# Patient Record
Sex: Male | Born: 1950
Health system: Southern US, Community
[De-identification: ages and names within clinical notes are randomized; demographics above are authoritative.]

## PROBLEM LIST (undated history)

## (undated) DIAGNOSIS — N529 Male erectile dysfunction, unspecified: Secondary | ICD-10-CM

## (undated) DIAGNOSIS — J309 Allergic rhinitis, unspecified: Secondary | ICD-10-CM

## (undated) DIAGNOSIS — N400 Enlarged prostate without lower urinary tract symptoms: Secondary | ICD-10-CM

## (undated) DIAGNOSIS — E785 Hyperlipidemia, unspecified: Secondary | ICD-10-CM

## (undated) DIAGNOSIS — N433 Hydrocele, unspecified: Secondary | ICD-10-CM

## (undated) DIAGNOSIS — I10 Essential (primary) hypertension: Principal | ICD-10-CM

## (undated) DIAGNOSIS — R972 Elevated prostate specific antigen [PSA]: Secondary | ICD-10-CM

## (undated) DIAGNOSIS — N183 Chronic kidney disease, stage 3 (moderate): Secondary | ICD-10-CM

## (undated) DIAGNOSIS — M1990 Unspecified osteoarthritis, unspecified site: Secondary | ICD-10-CM

## (undated) DIAGNOSIS — R3129 Other microscopic hematuria: Secondary | ICD-10-CM

## (undated) DIAGNOSIS — K589 Irritable bowel syndrome without diarrhea: Secondary | ICD-10-CM

## (undated) DIAGNOSIS — K648 Other hemorrhoids: Secondary | ICD-10-CM

## (undated) DIAGNOSIS — H8110 Benign paroxysmal vertigo, unspecified ear: Secondary | ICD-10-CM

## (undated) HISTORY — DX: Other hemorrhoids: K64.8

## (undated) HISTORY — DX: Hyperlipidemia, unspecified: E78.5

## (undated) HISTORY — DX: Essential (primary) hypertension: I10

## (undated) HISTORY — DX: Elevated prostate specific antigen (PSA): R97.20

## (undated) HISTORY — DX: Other microscopic hematuria: R31.29

## (undated) HISTORY — DX: Male erectile dysfunction, unspecified: N52.9

## (undated) HISTORY — DX: Allergic rhinitis, unspecified: J30.9

## (undated) HISTORY — DX: Irritable bowel syndrome, unspecified: K58.9

## (undated) HISTORY — DX: Chronic kidney disease, stage 3 (moderate): N18.3

## (undated) HISTORY — DX: Hydrocele, unspecified: N43.3

## (undated) HISTORY — DX: Benign paroxysmal vertigo, unspecified ear: H81.10

## (undated) HISTORY — DX: Unspecified osteoarthritis, unspecified site: M19.90

## (undated) HISTORY — DX: Benign prostatic hyperplasia without lower urinary tract symptoms: N40.0

---

## 1997-08-02 ENCOUNTER — Encounter: Payer: Self-pay | Admitting: Family Medicine

## 1997-08-02 LAB — CONVERTED CEMR LAB: Blood Glucose, Fasting: 91 mg/dL

## 2002-03-29 ENCOUNTER — Encounter: Payer: Self-pay | Admitting: Family Medicine

## 2002-03-29 LAB — CONVERTED CEMR LAB: Blood Glucose, Fasting: 84 mg/dL

## 2002-05-24 ENCOUNTER — Encounter: Payer: Self-pay | Admitting: Family Medicine

## 2002-05-24 LAB — CONVERTED CEMR LAB
Blood Glucose, Fasting: 86 mg/dL
PSA: 0.9 ng/mL
TSH: 2.8 microintl units/mL

## 2002-06-24 ENCOUNTER — Encounter: Payer: Self-pay | Admitting: Family Medicine

## 2002-07-21 HISTORY — PX: INGUINAL HERNIA REPAIR: SUR1180

## 2003-09-05 ENCOUNTER — Encounter: Payer: Self-pay | Admitting: Family Medicine

## 2003-09-05 LAB — CONVERTED CEMR LAB
Blood Glucose, Fasting: 85 mg/dL
PSA: 1.7 ng/mL
TSH: 3.2 microintl units/mL

## 2004-05-24 ENCOUNTER — Encounter: Payer: Self-pay | Admitting: Family Medicine

## 2004-12-21 ENCOUNTER — Ambulatory Visit: Payer: Self-pay | Admitting: Family Medicine

## 2005-01-01 ENCOUNTER — Ambulatory Visit: Payer: Self-pay | Admitting: Family Medicine

## 2006-01-08 ENCOUNTER — Ambulatory Visit: Payer: Self-pay | Admitting: Family Medicine

## 2006-01-08 LAB — CONVERTED CEMR LAB
Blood Glucose, Fasting: 87 mg/dL
PSA: 1.85 ng/mL
TSH: 2.35 microintl units/mL

## 2006-01-13 ENCOUNTER — Ambulatory Visit: Payer: Self-pay | Admitting: Family Medicine

## 2008-05-24 ENCOUNTER — Ambulatory Visit: Payer: Self-pay | Admitting: Family Medicine

## 2008-05-24 LAB — CONVERTED CEMR LAB
ALT: 33 units/L (ref 0–53)
AST: 29 units/L (ref 0–37)
Albumin: 3.9 g/dL (ref 3.5–5.2)
Alkaline Phosphatase: 83 units/L (ref 39–117)
BUN: 24 mg/dL — ABNORMAL HIGH (ref 6–23)
Bilirubin, Direct: 0.1 mg/dL (ref 0.0–0.3)
CO2: 30 meq/L (ref 19–32)
Calcium: 9.1 mg/dL (ref 8.4–10.5)
Chloride: 108 meq/L (ref 96–112)
Cholesterol: 186 mg/dL (ref 0–200)
Creatinine, Ser: 1.3 mg/dL (ref 0.4–1.5)
GFR calc Af Amer: 73 mL/min
GFR calc non Af Amer: 60 mL/min
Glucose, Bld: 97 mg/dL (ref 70–99)
HDL: 40.6 mg/dL (ref >39.0)
LDL Cholesterol: 130 mg/dL — ABNORMAL HIGH (ref 0–99)
PSA: 1.69 ng/mL (ref 0.10–4.00)
Potassium: 4.2 meq/L (ref 3.5–5.1)
Sodium: 143 meq/L (ref 135–145)
TSH: 3.73 microintl units/mL (ref 0.35–5.50)
Total Bilirubin: 0.5 mg/dL (ref 0.3–1.2)
Total CHOL/HDL Ratio: 4.6
Total Protein: 7 g/dL (ref 6.0–8.3)
Triglycerides: 76 mg/dL (ref 0–149)
VLDL: 15 mg/dL (ref 0–40)

## 2008-05-30 ENCOUNTER — Ambulatory Visit: Payer: Self-pay | Admitting: Family Medicine

## 2008-06-01 ENCOUNTER — Encounter: Payer: Self-pay | Admitting: Family Medicine

## 2008-06-01 DIAGNOSIS — J309 Allergic rhinitis, unspecified: Secondary | ICD-10-CM | POA: Insufficient documentation

## 2008-06-01 DIAGNOSIS — K589 Irritable bowel syndrome without diarrhea: Secondary | ICD-10-CM

## 2008-06-01 DIAGNOSIS — E785 Hyperlipidemia, unspecified: Secondary | ICD-10-CM | POA: Insufficient documentation

## 2008-07-04 ENCOUNTER — Ambulatory Visit: Payer: Self-pay | Admitting: Gastroenterology

## 2008-07-11 ENCOUNTER — Telehealth: Payer: Self-pay | Admitting: Gastroenterology

## 2008-07-12 ENCOUNTER — Encounter: Payer: Self-pay | Admitting: Gastroenterology

## 2008-07-12 ENCOUNTER — Ambulatory Visit: Payer: Self-pay | Admitting: Gastroenterology

## 2008-07-12 HISTORY — PX: COLONOSCOPY: SHX174

## 2008-07-14 ENCOUNTER — Encounter: Payer: Self-pay | Admitting: Gastroenterology

## 2008-08-15 ENCOUNTER — Ambulatory Visit: Payer: Self-pay | Admitting: Gastroenterology

## 2008-10-07 ENCOUNTER — Ambulatory Visit: Payer: Self-pay | Admitting: Family Medicine

## 2009-02-07 ENCOUNTER — Emergency Department: Payer: Self-pay | Admitting: Emergency Medicine

## 2009-02-08 ENCOUNTER — Telehealth: Payer: Self-pay | Admitting: Internal Medicine

## 2009-02-15 ENCOUNTER — Ambulatory Visit: Payer: Self-pay | Admitting: Family Medicine

## 2009-06-07 ENCOUNTER — Ambulatory Visit: Payer: Self-pay | Admitting: Family Medicine

## 2009-06-07 LAB — CONVERTED CEMR LAB
AST: 34 units/L (ref 0–37)
Albumin: 3.8 g/dL (ref 3.5–5.2)
Basophils Absolute: 0 10*3/uL (ref 0.0–0.1)
CO2: 31 meq/L (ref 19–32)
Chloride: 106 meq/L (ref 96–112)
Direct LDL: 145.4 mg/dL
Eosinophils Absolute: 0.3 10*3/uL (ref 0.0–0.7)
Glucose, Bld: 89 mg/dL (ref 70–99)
HCT: 41.9 % (ref 39.0–52.0)
Hemoglobin: 14.3 g/dL (ref 13.0–17.0)
Lymphs Abs: 2.6 10*3/uL (ref 0.7–4.0)
MCHC: 34.1 g/dL (ref 30.0–36.0)
Neutro Abs: 3.7 10*3/uL (ref 1.4–7.7)
Potassium: 4.4 meq/L (ref 3.5–5.1)
RDW: 12.4 % (ref 11.5–14.6)
Sodium: 142 meq/L (ref 135–145)
TSH: 2.68 microintl units/mL (ref 0.35–5.50)
Total Protein: 7.3 g/dL (ref 6.0–8.3)

## 2009-06-08 ENCOUNTER — Encounter: Payer: Self-pay | Admitting: Family Medicine

## 2009-06-13 ENCOUNTER — Ambulatory Visit: Payer: Self-pay | Admitting: Family Medicine

## 2009-12-04 ENCOUNTER — Ambulatory Visit: Payer: Self-pay | Admitting: Family Medicine

## 2009-12-05 LAB — CONVERTED CEMR LAB: PSA: 2.49 ng/mL (ref 0.10–4.00)

## 2009-12-11 ENCOUNTER — Ambulatory Visit: Payer: Self-pay | Admitting: Family Medicine

## 2009-12-19 ENCOUNTER — Telehealth: Payer: Self-pay | Admitting: Family Medicine

## 2010-04-10 ENCOUNTER — Encounter: Payer: Self-pay | Admitting: Family Medicine

## 2010-04-24 ENCOUNTER — Encounter (INDEPENDENT_AMBULATORY_CARE_PROVIDER_SITE_OTHER): Payer: Self-pay | Admitting: *Deleted

## 2010-06-25 ENCOUNTER — Ambulatory Visit: Payer: Self-pay | Admitting: Family Medicine

## 2010-06-26 LAB — CONVERTED CEMR LAB
ALT: 51 units/L (ref 0–53)
AST: 48 units/L — ABNORMAL HIGH (ref 0–37)
Albumin: 3.7 g/dL (ref 3.5–5.2)
Alkaline Phosphatase: 95 units/L (ref 39–117)
Basophils Relative: 0.8 % (ref 0.0–3.0)
Eosinophils Relative: 6.5 % — ABNORMAL HIGH (ref 0.0–5.0)
GFR calc non Af Amer: 67.53 mL/min (ref >60)
Glucose, Bld: 93 mg/dL (ref 70–99)
HCT: 42.3 % (ref 39.0–52.0)
Hemoglobin: 14.3 g/dL (ref 13.0–17.0)
Lymphocytes Relative: 21.4 % (ref 12.0–46.0)
Lymphs Abs: 1.9 10*3/uL (ref 0.7–4.0)
Monocytes Relative: 10.8 % (ref 3.0–12.0)
Neutro Abs: 5.4 10*3/uL (ref 1.4–7.7)
Potassium: 4.1 meq/L (ref 3.5–5.1)
RBC: 4.49 M/uL (ref 4.22–5.81)
RDW: 13.4 % (ref 11.5–14.6)
Sodium: 141 meq/L (ref 135–145)
TSH: 2.79 microintl units/mL (ref 0.35–5.50)
Total CHOL/HDL Ratio: 4
Total Protein: 6.7 g/dL (ref 6.0–8.3)
VLDL: 38 mg/dL (ref 0.0–40.0)
WBC: 8.9 10*3/uL (ref 4.5–10.5)

## 2010-06-28 ENCOUNTER — Ambulatory Visit: Payer: Self-pay | Admitting: Family Medicine

## 2010-08-16 ENCOUNTER — Encounter: Payer: Self-pay | Admitting: Family Medicine

## 2010-10-01 ENCOUNTER — Other Ambulatory Visit: Payer: Self-pay | Admitting: Family Medicine

## 2010-10-01 ENCOUNTER — Ambulatory Visit
Admission: RE | Admit: 2010-10-01 | Discharge: 2010-10-01 | Payer: Self-pay | Source: Home / Self Care | Attending: Family Medicine | Admitting: Family Medicine

## 2010-10-01 LAB — LIPID PANEL
Cholesterol: 221 mg/dL — ABNORMAL HIGH (ref 0–200)
HDL: 48.9 mg/dL (ref >39.00)
Total CHOL/HDL Ratio: 5
Triglycerides: 94 mg/dL (ref 0.0–149.0)
VLDL: 18.8 mg/dL (ref 0.0–40.0)

## 2010-10-01 LAB — CBC WITH DIFFERENTIAL/PLATELET
Basophils Absolute: 0.1 10*3/uL (ref 0.0–0.1)
Basophils Relative: 0.7 % (ref 0.0–3.0)
Eosinophils Absolute: 0.6 10*3/uL (ref 0.0–0.7)
Eosinophils Relative: 7.5 % — ABNORMAL HIGH (ref 0.0–5.0)
HCT: 43.6 % (ref 39.0–52.0)
Hemoglobin: 14.5 g/dL (ref 13.0–17.0)
Lymphocytes Relative: 32.8 % (ref 12.0–46.0)
Lymphs Abs: 2.4 10*3/uL (ref 0.7–4.0)
MCHC: 33.2 g/dL (ref 30.0–36.0)
MCV: 93.9 fl (ref 78.0–100.0)
Monocytes Absolute: 0.8 10*3/uL (ref 0.1–1.0)
Monocytes Relative: 10.6 % (ref 3.0–12.0)
Neutro Abs: 3.6 10*3/uL (ref 1.4–7.7)
Neutrophils Relative %: 48.4 % (ref 43.0–77.0)
Platelets: 230 10*3/uL (ref 150.0–400.0)
RBC: 4.65 Mil/uL (ref 4.22–5.81)
RDW: 13.8 % (ref 11.5–14.6)
WBC: 7.4 10*3/uL (ref 4.5–10.5)

## 2010-10-01 LAB — BASIC METABOLIC PANEL
BUN: 26 mg/dL — ABNORMAL HIGH (ref 6–23)
CO2: 28 mEq/L (ref 19–32)
Calcium: 9 mg/dL (ref 8.4–10.5)
Chloride: 104 mEq/L (ref 96–112)
Creatinine, Ser: 1.2 mg/dL (ref 0.4–1.5)
GFR: 64.9 mL/min (ref >60.00)
Glucose, Bld: 98 mg/dL (ref 70–99)
Potassium: 4.1 mEq/L (ref 3.5–5.1)
Sodium: 139 mEq/L (ref 135–145)

## 2010-10-01 LAB — PSA: PSA: 2.9 ng/mL (ref 0.10–4.00)

## 2010-10-01 LAB — LDL CHOLESTEROL, DIRECT: Direct LDL: 156.9 mg/dL

## 2010-10-01 LAB — HEPATIC FUNCTION PANEL
ALT: 27 U/L (ref 0–53)
AST: 26 U/L (ref 0–37)
Albumin: 3.8 g/dL (ref 3.5–5.2)
Alkaline Phosphatase: 77 U/L (ref 39–117)
Bilirubin, Direct: 0 mg/dL (ref 0.0–0.3)
Total Bilirubin: 0.5 mg/dL (ref 0.3–1.2)
Total Protein: 6.7 g/dL (ref 6.0–8.3)

## 2010-10-01 LAB — TSH: TSH: 2.25 u[IU]/mL (ref 0.35–5.50)

## 2010-10-02 ENCOUNTER — Ambulatory Visit: Admit: 2010-10-02 | Payer: Self-pay | Admitting: Family Medicine

## 2010-10-03 ENCOUNTER — Ambulatory Visit
Admission: RE | Admit: 2010-10-03 | Discharge: 2010-10-03 | Payer: Self-pay | Source: Home / Self Care | Attending: Family Medicine | Admitting: Family Medicine

## 2010-10-16 NOTE — Assessment & Plan Note (Signed)
Summary: F/U LABWORK/CLE   Vital Signs:  Patient profile:   60 year old male Weight:      180 pounds Temp:     97.6 degrees F oral Pulse rate:   68 / minute Pulse rhythm:   regular BP sitting:   140 / 88  (left arm) Cuff size:   regular  Vitals Entered By: Sydell Axon LPN (June 28, 2010 9:10 AM) CC: follow-up visit   History of Present Illness: Pt here for followup...he had Comp Exam  05/2009 and has done well. His PSA was elevated last time. He has no complaints and is here to followup his PSA. He had had a significant amount of honey lately as he was fighting infection/URI sxs.  Problems Prior to Update: 1)  Psa, Increased By > 10%  (ICD-790.93) 2)  Special Screening For Malignant Neoplasms Colon  (ICD-V76.51) 3)  Diarrhea, Functional  (ICD-564.5) 4)  Allergic Rhinitis, Chronic  (ICD-477.9) 5)  Irritable Bowel Syndrome  (ICD-564.1) 6)  Health Maintenance Exam  (ICD-V70.0) 7)  Other and Unspecified Hyperlipidemia  (ICD-272.4) 8)  Special Screening Malignant Neoplasm of Prostate  (ICD-V76.44)  Medications Prior to Update: 1)  Multivitamins  Tabs (Multiple Vitamin) .... Once Daily 2)  Fish Oil 1000 Mg Caps (Omega-3 Fatty Acids) .... Once Daily  Allergies: No Known Drug Allergies  Physical Exam  General:  Well-developed,well-nourished,in no acute distress; alert,appropriate and cooperative throughout examination Head:  Normocephalic and atraumatic without obvious abnormalities. No apparent alopecia or balding. Sinuses NT. Eyes:  Conjunctiva clear bilaterally.  Ears:  External ear exam shows no significant lesions or deformities.  Otoscopic examination reveals clear canals, tympanic membranes are intact bilaterally without bulging, retraction, inflammation or discharge. Hearing is grossly normal bilaterally. Nose:  External nasal examination shows no deformity or inflammation. Nasal mucosa are pink and moist without lesions or exudates. Mouth:  Oral mucosa and oropharynx  without lesions or exudates.  Teeth in good repair. Neck:  No deformities, masses, or tenderness noted. Lungs:  Normal respiratory effort, chest expands symmetrically. Lungs are clear to auscultation, no crackles or wheezes. Heart:  Normal rate and regular rhythm. S1 and S2 normal without gallop, murmur, click, rub or other extra sounds.   Impression & Recommendations:  Problem # 1:  PSA, INCREASED BY > 10% (ICD-790.93) Assessment Deteriorated Continues to increase. Will recheck again in 3-4 mos and refer if over 4 and free PSA <20. Await future test. May try Ab challenge as well.  Problem # 2:  OTHER AND UNSPECIFIED HYPERLIPIDEMIA (ICD-272.4) Assessment: Improved  Great nos. except Trigs which was probably influenced by recent diet of honey.  Labs Reviewed: SGOT: 48 (06/25/2010)   SGPT: 51 (06/25/2010)   HDL:40.70 (06/25/2010), 40.90 (06/07/2009)  LDL:100 (06/25/2010), 130 (05/24/2008)  Chol:179 (06/25/2010), 226 (06/07/2009)  Trig:190.0 (06/25/2010), 179.0 (06/07/2009)  Complete Medication List: 1)  Multivitamins Tabs (Multiple vitamin) .... Once daily 2)  Fish Oil 1000 Mg Caps (Omega-3 fatty acids) .... Once daily  Patient Instructions: 1)  RTC for Comp Exam approx 3 mos with labs prior  Current Allergies (reviewed today): No known allergies

## 2010-10-16 NOTE — Miscellaneous (Signed)
Summary: Flu vaccine   Clinical Lists Changes  Observations: Added new observation of FLU VAX: Historical (08/04/2010 12:34)      Influenza Immunization History:    Influenza # 1:  Historical (08/04/2010)

## 2010-10-16 NOTE — Letter (Signed)
Summary: William Shelton letter  William Shelton at [facility]  [address] [place], [place] [address]   Phone: [phone]  Fax: [phone]       04/24/2010 MRN: [id number]  William Shelton [address] [place], [place]  [address]  Dear [name] [facility] [place] and [facility] announce the retirement of William Shelton, M.D., from full-time practice at the [place] office effective March 15, 2010 and his plans of returning part-time.  It is important to William Shelton and to our practice that you understand that [facility] has seven physicians in our office for your health care needs.  We will continue to offer the same exceptional care that you have today.    William Shelton has spoken to many of you about his plans for retirement and returning part-time in the fall.   We will continue to work with you through the transition to schedule appointments for you in the office and meet the high standards that [facility] is committed to.   Again, it is with great pleasure that we share the news that William Shelton will return to [facility] at [facility] in October of 2011 with a reduced schedule.    If you have any questions, or would like to request an appointment with one of our physicians, please call us at [phone] and press the option for Scheduling an appointment.  We take pleasure in providing you with excellent patient care and look forward to seeing you at your next office visit.  Our [facility] Physicians are:  William Shelton, M.D. William Shelton, M.D. William Shelton, M.D. William Shelton, M.D. William Shelton, M.D. William Shelton, M.D. We proudly welcomed William Shelton, M.D. and William Shelton, M.D. to the practice in July/August 2011.  Sincerely,  [place] [facility]

## 2010-10-16 NOTE — Therapy (Signed)
Summary: [facility]  [facility]   Imported By: Lanelle Bal 04/23/2010 08:32:52  _____________________________________________________________________  External Attachment:    Type:   Image     Comment:   External Document

## 2010-10-16 NOTE — Assessment & Plan Note (Signed)
Summary: 6 MONTH RECHECK/RBH   Vital Signs:  Patient profile:   60 year old male Weight:      186 pounds BMI:     24.97 Temp:     98.1 degrees F oral Pulse rate:   64 / minute Pulse rhythm:   regular BP sitting:   134 / 84  (left arm) Cuff size:   regular  Vitals Entered By: Sydell Axon LPN (December 11, 2009 2:35 PM) CC: 6 Month follow-up after labs   History of Present Illness: Pt here for 6 month followup. His PSA had increased by more than 10% last time. He feels well and has no complaints.  Problems Prior to Update: 1)  Psa, Increased By > 10%  (ICD-790.93) 2)  Special Screening For Malignant Neoplasms Colon  (ICD-V76.51) 3)  Diarrhea, Functional  (ICD-564.5) 4)  Allergic Rhinitis, Chronic  (ICD-477.9) 5)  Irritable Bowel Syndrome  (ICD-564.1) 6)  Health Maintenance Exam  (ICD-V70.0) 7)  Other and Unspecified Hyperlipidemia  (ICD-272.4) 8)  Special Screening Malignant Neoplasm of Prostate  (ICD-V76.44)  Medications Prior to Update: 1)  Multivitamins  Tabs (Multiple Vitamin) .... Once Daily 2)  Fish Oil 1000 Mg Caps (Omega-3 Fatty Acids) .... Once Daily  Allergies: No Known Drug Allergies  Physical Exam  General:  Well-developed,well-nourished,in no acute distress; alert,appropriate and cooperative throughout examination Head:  Normocephalic and atraumatic without obvious abnormalities. No apparent alopecia or balding. Sinuses NT. Eyes:  Conjunctiva clear bilaterally.  Ears:  External ear exam shows no significant lesions or deformities.  Otoscopic examination reveals clear canals, tympanic membranes are intact bilaterally without bulging, retraction, inflammation or discharge. Hearing is grossly normal bilaterally. Nose:  External nasal examination shows no deformity or inflammation. Nasal mucosa are pink and moist without lesions or exudates. Mouth:  Oral mucosa and oropharynx without lesions or exudates.  Teeth in good repair. Neck:  No deformities, masses, or  tenderness noted. Chest Wall:  No deformities, masses, tenderness or gynecomastia noted. Lungs:  Normal respiratory effort, chest expands symmetrically. Lungs are clear to auscultation, no crackles or wheezes. Heart:  Normal rate and regular rhythm. S1 and S2 normal without gallop, murmur, click, rub or other extra sounds.   Impression & Recommendations:  Problem # 1:  PSA, INCREASED BY > 10% (ICD-790.93) Has decreased from 2.55 to 2.49. Will cont to follow.   Complete Medication List: 1)  Multivitamins Tabs (Multiple vitamin) .... Once daily 2)  Fish Oil 1000 Mg Caps (Omega-3 fatty acids) .... Once daily  Patient Instructions: 1)  RTC as discussed. 2)  If can't get appt in Oct for Comp Exam, have appt for PSA recheck.  Current Allergies (reviewed today): No known allergies

## 2010-10-16 NOTE — Progress Notes (Signed)
  Phone Note Call from Patient   Summary of Call: Pt's wife here for appt. Requested Epi pen for hwer husband who works with [employer] and has students come over. Script handwritten and given to pt for Epi pen Kit. Initial call taken by: Shaune Leeks MD,  December 19, 2009 2:35 PM

## 2010-10-18 NOTE — Assessment & Plan Note (Signed)
Summary: CPX/RBH   Vital Signs:  Patient profile:   60 year old male Height:      72 inches Weight:      175.75 pounds BMI:     23.92 Temp:     98.6 degrees F oral Pulse rate:   68 / minute Pulse rhythm:   regular BP sitting:   118 / 78  (left arm) Cuff size:   regular  Vitals Entered By: Sydell Axon LPN (October 03, 2010 9:29 AM) CC: 30 Minute checkup, had a colonoscopy 10/09 by Dr. [name]   History of Present Illness: Pt here for Comp Exam..Marland KitchenHe is walking regularly.  He takes Excedrin for headaches twice a month at most....fdiscussed risk/benefit.   Preventive Screening-Counseling & Management  Alcohol-Tobacco     Alcohol drinks/day: 0     Alcohol type: Rare wine     Smoking Status: never     Passive Smoke Exposure: no  Caffeine-Diet-Exercise     Caffeine use/day: 2     Does Patient Exercise: yes     Type of exercise: treadmill 21/2 miles weight machines, yoga     Exercise (avg: min/session): 30-60  Problems Prior to Update: 1)  Psa, Increased By > 10%  (ICD-790.93) 2)  Special Screening For Malignant Neoplasms Colon  (ICD-V76.51) 3)  Diarrhea, Functional  (ICD-564.5) 4)  Allergic Rhinitis, Chronic  (ICD-477.9) 5)  Irritable Bowel Syndrome  (ICD-564.1) 6)  Health Maintenance Exam  (ICD-V70.0) 7)  Other and Unspecified Hyperlipidemia  (ICD-272.4) 8)  Special Screening Malignant Neoplasm of Prostate  (ICD-V76.44)  Medications Prior to Update: 1)  Multivitamins  Tabs (Multiple Vitamin) .... Once Daily 2)  Fish Oil 1000 Mg Caps (Omega-3 Fatty Acids) .... Once Daily  Current Medications (verified): 1)  Multivitamins  Tabs (Multiple Vitamin) .... Once Daily 2)  Fish Oil 1000 Mg Caps (Omega-3 Fatty Acids) .... Once Daily  Allergies: No Known Drug Allergies  Past History:  Past Medical History: Last updated: 07/04/2008 Hyperlipidemia Irritable Bowel Syndrome  Past Surgical History: Last updated: 08/15/2008 L Ingunal Herniorraphy (Dr Lemar Livings)  07/21/02 Colonoscopy Int Hems (Dr [name]) 07/12/08       10 years  Family History: Last updated: 10/03/2010 Father: A  85  Htn Prostate cancer (radiation beads) Macular                  Degen Mother: dec UNKNOWN CAUSES BROTHER A  65  CHOLESTEROL BPH 1/2 SISTER  A  46 CV: NEGATIVE HBP: + FATHER DM: NEGATIVE GOUT/ARTHRITIS:  PROSTATE CANCER: + FATHER BREAST/OVARIAN/UTERINE CANCER: NEGATIVE COLON CANCER: NEGATIVE DEPRESSION: NEGATIVE ETOH/DRUG ABUSE: NEGATIVE OTHER: NEGATIVE STROKE  Social History: Last updated: 08/15/2008 Marital Status: Married  LIVES WITH WIFE  Children: 1 SON  Occupation: RETIRED ENGINEER [employer]                     BEEKEEPING Patient has never smoked.  Alcohol Use - no Daily Caffeine Use  Risk Factors: Alcohol Use: 0 (10/03/2010) Caffeine Use: 2 (10/03/2010) Exercise: yes (10/03/2010)  Risk Factors: Smoking Status: never (10/03/2010) Passive Smoke Exposure: no (10/03/2010)  Family History: Father: A  44  Htn Prostate cancer (radiation beads) Macular                  Degen Mother: dec UNKNOWN CAUSES BROTHER A  65  CHOLESTEROL BPH 1/2 SISTER  A  46 CV: NEGATIVE HBP: + FATHER DM: NEGATIVE GOUT/ARTHRITIS:  PROSTATE CANCER: + FATHER BREAST/OVARIAN/UTERINE CANCER: NEGATIVE COLON CANCER: NEGATIVE DEPRESSION:  NEGATIVE ETOH/DRUG ABUSE: NEGATIVE OTHER: NEGATIVE STROKE  Review of Systems General:  Denies chills, fatigue, fever, sweats, weakness, and weight loss. Eyes:  Denies blurring, discharge, and eye pain. ENT:  Complains of decreased hearing; denies ear discharge, earache, and ringing in ears; has hearing aids. CV:  Denies chest pain or discomfort, fainting, fatigue, palpitations, shortness of breath with exertion, swelling of feet, and swelling of hands. Resp:  Denies cough, shortness of breath, and wheezing. GI:  Denies abdominal pain, bloody stools, change in bowel habits, constipation, dark tarry stools, diarrhea, indigestion, loss of  appetite, nausea, vomiting, vomiting blood, and yellowish skin color. GU:  Complains of urinary frequency; denies discharge, dysuria, and nocturia. MS:  Denies joint pain, low back pain, muscle aches, cramps, and stiffness. Derm:  Complains of dryness; denies itching and rash; winter. Neuro:  Denies numbness, poor balance, tingling, and tremors.  Physical Exam  General:  Well-developed,well-nourished,in no acute distress; alert,appropriate and cooperative throughout examination. Looks very healthy. Head:  Normocephalic and atraumatic without obvious abnormalities. Sinuses NT. Eyes:  Conjunctiva clear bilaterally.  Ears:  External ear exam shows no significant lesions or deformities.  Otoscopic examination reveals clear canals, tympanic membranes are intact bilaterally without bulging, retraction, inflammation or discharge. Hearing is grossly normal bilaterally. Nose:  External nasal examination shows no deformity or inflammation. Nasal mucosa are pink and moist without lesions or exudates. Mouth:  Oral mucosa and oropharynx without lesions or exudates.  Teeth in good repair. Neck:  No deformities, masses, or tenderness noted. Chest Wall:  No deformities, masses, tenderness or gynecomastia noted. Breasts:  No masses or gynecomastia noted Lungs:  Normal respiratory effort, chest expands symmetrically. Lungs are clear to auscultation, no crackles or wheezes. Heart:  Normal rate and regular rhythm. S1 and S2 normal without gallop, murmur, click, rub or other extra sounds. Abdomen:  Bowel sounds positive,abdomen soft and non-tender without masses, organomegaly or hernias noted. Rectal:  No external abnormalities noted. Normal sphincter tone. No rectal masses or tenderness. G neg. Genitalia:  Testes bilaterally descended without nodularity, tenderness or masses. No scrotal masses or lesions. No penis lesions or urethral discharge. Prostate:  Prostate gland firm and smooth, no enlargement, nodularity,  tenderness, mass, asymmetry or induration. 20gms. Msk:  No deformity or scoliosis noted of thoracic or lumbar spine.   Pulses:  R and L carotid,radial,femoral,dorsalis pedis and posterior tibial pulses are full and equal bilaterally Extremities:  No clubbing, cyanosis, edema, or deformity noted with normal full range of motion of all joints.   Neurologic:  No cranial nerve deficits noted. Station and gait are normal. Plantar reflexes are down-going bilaterally. DTRs are symmetrical throughout. Sensory, motor and coordinative functions appear intact. Skin:  Intact without suspicious lesions or rashes, torso SKs. Cervical Nodes:  No lymphadenopathy noted Inguinal Nodes:  No significant adenopathy Psych:  Cognition and judgment appear intact. Alert and cooperative with normal attention span and concentration. No apparent delusions, illusions, hallucinations   Impression & Recommendations:  Problem # 1:  HEALTH MAINTENANCE EXAM (ICD-V70.0) Assessment Comment Only  Reviewed preventive care protocols, scheduled due services, and updated immunizations.  Problem # 2:  PSA, INCREASED BY > 10% (ICD-790.93) Assessment: Unchanged Stable, no further increase. Will continue to follow.  Problem # 3:  ALLERGIC RHINITIS, CHRONIC (ICD-477.9) Assessment: Unchanged Stable.  Problem # 4:  IRRITABLE BOWEL SYNDROME (ICD-564.1) Assessment: Unchanged Well controlled.  Problem # 5:  OTHER AND UNSPECIFIED HYPERLIPIDEMIA (ICD-272.4) Assessment: Unchanged No better, no worse. Will need Statins eventually to get LDLD  down. Labs Reviewed: SGOT: 26 (10/01/2010)   SGPT: 27 (10/01/2010)   HDL:48.90 (10/01/2010), 40.70 (06/25/2010)  LDL:100 (06/25/2010), 130 (05/24/2008)  Chol:221 (10/01/2010), 179 (06/25/2010)  Trig:94.0 (10/01/2010), 190.0 (06/25/2010)  Complete Medication List: 1)  Multivitamins Tabs (Multiple vitamin) .... Once daily 2)  Fish Oil 1000 Mg Caps (Omega-3 fatty acids) .... Once daily  Patient  Instructions: 1)  RTC one year, sooner as needed.   Orders Added: 1)  Est. Patient 40-64 years [99396]    Current Allergies (reviewed today): No known allergies

## 2011-05-31 ENCOUNTER — Ambulatory Visit (INDEPENDENT_AMBULATORY_CARE_PROVIDER_SITE_OTHER): Payer: Managed Care, Other (non HMO) | Admitting: Family Medicine

## 2011-05-31 ENCOUNTER — Encounter: Payer: Self-pay | Admitting: Family Medicine

## 2011-05-31 VITALS — BP 122/78 | HR 76 | Temp 98.5°F | Ht 72.0 in | Wt 179.8 lb

## 2011-05-31 DIAGNOSIS — IMO0002 Reserved for concepts with insufficient information to code with codable children: Secondary | ICD-10-CM

## 2011-05-31 MED ORDER — DOXYCYCLINE HYCLATE 100 MG PO CAPS: 100.0000 mg | ORAL_CAPSULE | Freq: Two times a day (BID) | ORAL | Status: AC

## 2011-05-31 NOTE — Progress Notes (Signed)
  Subjective:    Patient ID: William Shelton, male    DOB: 01/17/1951, 60 y.o.   MRN: [id number]  HPI CC: bug bites  Getting bit by something at home for last month.  L foot as well as groin area.  Groin slowly improved on own.  Hasn't seen or felt anything bit him.  Now has swelling and redness with blister between 1st and 2nd toes. Very itchy, scratching.  Has tried neosporin.    No one else at home with similar rash.  Has chiggers and small ticks at home.  no fevers, chills, rashes elsewhere. No abd pain, nausea, vomiting.  No oral lesions.  Review of Systems Per HPI    Objective:   Physical Exam  Nursing note and vitals reviewed. Constitutional: He appears well-developed and well-nourished. No distress.  Cardiovascular:  Pulses:      Dorsalis pedis pulses are 2+ on the right side, and 2+ on the left side.       Posterior tibial pulses are 2+ on the right side, and 2+ on the left side.  Musculoskeletal:       Feet:  Skin: Skin is warm and dry.          Several open lesions about 5mm diameter in different stages of healing (see picture).  Small bilster between 1st and 2nd toes L foot, surrounding erythema spreading up dorsal foot. No warmth.  Erythema outlined          Assessment & Plan:

## 2011-05-31 NOTE — Patient Instructions (Signed)
Good to see you today - could be allergic reaction to bug bite causing swelling and redness, or could be infection. Treat with doxycycline twice daily for 7 days. Update [place] if fever, new rashes, spreading redness or worsening instead of improving. Make sure to wash all bedding, clothing.

## 2011-05-31 NOTE — Assessment & Plan Note (Signed)
Vesicular lesion left foot, either allergic reaction to bite or infected with spreading erythema.  Treat with antibiotic for 7 d course.  Update [place] if not improving as expected. rec wash all clothing, bedding

## 2011-06-05 ENCOUNTER — Encounter: Payer: Self-pay | Admitting: Family Medicine

## 2011-10-11 ENCOUNTER — Other Ambulatory Visit: Payer: Self-pay | Admitting: Family Medicine

## 2011-10-11 DIAGNOSIS — E785 Hyperlipidemia, unspecified: Secondary | ICD-10-CM

## 2011-10-11 DIAGNOSIS — Z125 Encounter for screening for malignant neoplasm of prostate: Secondary | ICD-10-CM

## 2011-10-15 ENCOUNTER — Other Ambulatory Visit (INDEPENDENT_AMBULATORY_CARE_PROVIDER_SITE_OTHER): Payer: Managed Care, Other (non HMO)

## 2011-10-15 DIAGNOSIS — E785 Hyperlipidemia, unspecified: Secondary | ICD-10-CM

## 2011-10-15 DIAGNOSIS — Z125 Encounter for screening for malignant neoplasm of prostate: Secondary | ICD-10-CM

## 2011-10-15 LAB — COMPREHENSIVE METABOLIC PANEL
Alkaline Phosphatase: 73 U/L (ref 39–117)
BUN: 22 mg/dL (ref 6–23)
CO2: 29 mEq/L (ref 19–32)
GFR: 48.96 mL/min — ABNORMAL LOW (ref >60.00)
Glucose, Bld: 90 mg/dL (ref 70–99)
Sodium: 141 mEq/L (ref 135–145)
Total Bilirubin: 0.7 mg/dL (ref 0.3–1.2)
Total Protein: 7.2 g/dL (ref 6.0–8.3)

## 2011-10-15 LAB — LIPID PANEL
Cholesterol: 220 mg/dL — ABNORMAL HIGH (ref 0–200)
HDL: 47.3 mg/dL (ref >39.00)
Triglycerides: 110 mg/dL (ref 0.0–149.0)

## 2011-10-15 LAB — PSA: PSA: 4.25 ng/mL — ABNORMAL HIGH (ref 0.10–4.00)

## 2011-10-15 LAB — LDL CHOLESTEROL, DIRECT: Direct LDL: 150.8 mg/dL

## 2011-10-22 ENCOUNTER — Ambulatory Visit (INDEPENDENT_AMBULATORY_CARE_PROVIDER_SITE_OTHER): Payer: Managed Care, Other (non HMO) | Admitting: Family Medicine

## 2011-10-22 ENCOUNTER — Encounter: Payer: Self-pay | Admitting: Family Medicine

## 2011-10-22 VITALS — BP 136/90 | HR 72 | Temp 98.6°F | Ht 73.0 in | Wt 182.8 lb

## 2011-10-22 DIAGNOSIS — Z86718 Personal history of other venous thrombosis and embolism: Secondary | ICD-10-CM | POA: Insufficient documentation

## 2011-10-22 DIAGNOSIS — M25519 Pain in unspecified shoulder: Secondary | ICD-10-CM

## 2011-10-22 DIAGNOSIS — M79609 Pain in unspecified limb: Secondary | ICD-10-CM

## 2011-10-22 DIAGNOSIS — N289 Disorder of kidney and ureter, unspecified: Secondary | ICD-10-CM

## 2011-10-22 DIAGNOSIS — Z0000 Encounter for general adult medical examination without abnormal findings: Secondary | ICD-10-CM | POA: Insufficient documentation

## 2011-10-22 DIAGNOSIS — N183 Chronic kidney disease, stage 3 unspecified: Secondary | ICD-10-CM

## 2011-10-22 DIAGNOSIS — M79605 Pain in left leg: Secondary | ICD-10-CM | POA: Insufficient documentation

## 2011-10-22 DIAGNOSIS — M25511 Pain in right shoulder: Secondary | ICD-10-CM | POA: Insufficient documentation

## 2011-10-22 DIAGNOSIS — R972 Elevated prostate specific antigen [PSA]: Secondary | ICD-10-CM | POA: Insufficient documentation

## 2011-10-22 HISTORY — DX: Chronic kidney disease, stage 3 unspecified: N18.30

## 2011-10-22 NOTE — Assessment & Plan Note (Signed)
Cr elevated to 1.5 on check, however has had elevated up to 1.4 a few years back. Will recheck when returns in 3 mo for f/u. Already avoids NSAIDs, thinks staying well hydrated.

## 2011-10-22 NOTE — Assessment & Plan Note (Signed)
Prostate - DRE with enlarged prostate but no sxs.   PSA elevated but normal for age range.   Given fmhx, rec return 3 mo for recheck level.   No LUTS.

## 2011-10-22 NOTE — Assessment & Plan Note (Signed)
Exam overall normal today.  Continue to monitor, update me if worsening. Provided with hamstring stretching exercises. Involved in yoga recently, hopeful to improve strength through this.

## 2011-10-22 NOTE — Assessment & Plan Note (Signed)
Doubt tear.  Anticipate RTC injury - supraspinatus. Provided with SM pt advisor handout with strengthening exercises as well as resistance band to use at home. If not improving, refer to ortho or PT.

## 2011-10-22 NOTE — Patient Instructions (Addendum)
Call your insurace about the shingles shot to see if it is covered or how much it would cost and where is cheaper (here or pharmacy).  If you want to receive here, call for nurse visit. Return in 3 months for blood work, afterwards for visit. Good to see you today, call us with questions. For prostate - feels enlarged, will recheck in 3 mo. For shoulder - stretching/strengthening exercises provided.  May be rotator cuff tendinopathy (supraspinatus).  For leg - try hamstring stretches provided. Update [place] if worsening.

## 2011-10-22 NOTE — Assessment & Plan Note (Addendum)
Preventative protocols reviewed and updated unless pt declined. utd flu and tetanus. Advised to check with insurance about shingles coverage. utd colonoscopy

## 2011-10-22 NOTE — Progress Notes (Signed)
Subjective:    Patient ID: William Shelton, male    DOB: [birth year], 61 y.o.   MRN: [id number]  HPI CC: CPE  Prior pt of Dr. Lyndel Pleasure' presents for CPE today and transfer care.  Started yoga, trouble with left leg (?hip or leg).  Finds leg more unstable when in certain yoga positions.  H/o pulled hamstring 1 yr ago.  R shoulder - longstanding.  torn rotator cuff, starting to bother him more.  Prior saw ortho, decided against surgery.  Now getting more painful anterior shoulder and sometimes radiates to arm.  Skin spot - wants checked R shoulder.  Present for 1 year.  Sometimes gets sore.  Sometimes bleeds.  Preventative: Last CPE 1 yr ago.  Last blood work 1 yr ago.  Flu shot 2012.   Td 2009. zostavax - unsure if has had chicken pox.   Colonoscopy - 2009, normal Prostate - has had checked yearly.  This visit PSA elevated to 4.25, but low risk velocity.  Strong stream.  No regular nocturia.  + fmhx in form of father with prostate cancer dx age 17s, controlled with seeds.  Medications and allergies reviewed and updated in chart.  Past histories reviewed and updated if relevant as below. Patient Active Problem List  Diagnoses  . OTHER AND UNSPECIFIED HYPERLIPIDEMIA  . ALLERGIC RHINITIS, CHRONIC  . IRRITABLE BOWEL SYNDROME  . Bug bite of foot or toe   Past Medical History  Diagnosis Date  . HLD (hyperlipidemia)   . IBS (irritable bowel syndrome)   . Allergic rhinitis, cause unspecified   . Elevated prostate specific antigen (PSA)     increased by > 10 %  . Internal hemorrhoid    Past Surgical History  Procedure Date  . Inguinal hernia repair 07/21/02    Left---Dr. Lemar Livings  . Colonoscopy 07/12/08    Internal hemorrhoids---Dr. [name]   History  Substance Use Topics  . Smoking status: Never Smoker   . Smokeless tobacco: Not on file  . Alcohol Use: No   Family History  Problem Relation Age of Onset  . Hypertension Father   . Prostate cancer Father     Radiation  beads  . Macular degeneration Father   . Hyperlipidemia Brother   . Other Brother     BPH  . Diabetes Neg Hx   . Breast cancer Neg Hx   . Colon cancer Neg Hx   . Depression Neg Hx   . Alcohol abuse Neg Hx   . Stroke Neg Hx    No Known Allergies Current Outpatient Prescriptions on File Prior to Visit  Medication Sig Dispense Refill  . fish oil-omega-3 fatty acids 1000 MG capsule Take 1 g by mouth daily.        . Multiple Vitamin (MULTIVITAMIN) tablet Take 1 tablet by mouth daily.        . NON FORMULARY as directed. Protein powder          Review of Systems  Constitutional: Negative for fever, chills, activity change, appetite change, fatigue and unexpected weight change.  HENT: Negative for hearing loss and neck pain.   Eyes: Negative for visual disturbance.  Respiratory: Negative for cough, chest tightness, shortness of breath and wheezing.   Cardiovascular: Negative for chest pain, palpitations and leg swelling.  Gastrointestinal: Negative for nausea, vomiting, abdominal pain, diarrhea, constipation, blood in stool and abdominal distention.  Genitourinary: Negative for hematuria and difficulty urinating.  Musculoskeletal: Negative for myalgias and arthralgias.  Skin: Negative for rash.  Neurological: Negative for dizziness, seizures, syncope and headaches.  Hematological: Does not bruise/bleed easily.  Psychiatric/Behavioral: Negative for dysphoric mood. The patient is not nervous/anxious.        Objective:   Physical Exam  Nursing note and vitals reviewed. Constitutional: He is oriented to person, place, and time. He appears well-developed and well-nourished. No distress.  HENT:  Head: Normocephalic and atraumatic.  Right Ear: External ear normal.  Left Ear: External ear normal.  Nose: Nose normal.  Mouth/Throat: Oropharynx is clear and moist. No oropharyngeal exudate.  Eyes: Conjunctivae and EOM are normal. Pupils are equal, round, and reactive to light. No scleral  icterus.  Neck: Normal range of motion. Neck supple. No thyromegaly present.  Cardiovascular: Normal rate, regular rhythm, normal heart sounds and intact distal pulses.   No murmur heard. Pulses:      Radial pulses are 2+ on the right side, and 2+ on the left side.  Pulmonary/Chest: Effort normal and breath sounds normal. No respiratory distress. He has no wheezes. He has no rales.  Abdominal: Soft. Bowel sounds are normal. He exhibits no distension and no mass. There is no tenderness. There is no rebound and no guarding.  Genitourinary: Rectum normal. Rectal exam shows no external hemorrhoid, no internal hemorrhoid, no fissure, no mass, no tenderness and anal tone normal. Prostate is enlarged (~40gm). Prostate is not tender.       No prostate asymmetry/nodularity  Musculoskeletal: Normal range of motion. He exhibits no edema.       FROM neck and shoulders, some tightness with raising right shoulder above 90d in abduction. Strength intact with int/ext rotation at shoulder + empty can on right. Neg Speeds and Yergasons Mildly + neer on right. No pain with rotation of humeral head in [facility] joint.  No pain with palpation of SIJ, GTB or sciatic notch. No pain with int/ ext rotation at hip Neg FABER. Equal strength with testing of hip flexors and abductors  Lymphadenopathy:    He has no cervical adenopathy.  Neurological: He is alert and oriented to person, place, and time.       CN grossly intact, station and gait intact  Skin: Skin is warm and dry. No rash noted.       R anterior skin between neck and shoulder with small non irritated SK  Psychiatric: He has a normal mood and affect. His behavior is normal. Judgment and thought content normal.      Assessment & Plan:

## 2012-01-13 ENCOUNTER — Other Ambulatory Visit (INDEPENDENT_AMBULATORY_CARE_PROVIDER_SITE_OTHER): Payer: Managed Care, Other (non HMO)

## 2012-01-13 DIAGNOSIS — N289 Disorder of kidney and ureter, unspecified: Secondary | ICD-10-CM

## 2012-01-13 DIAGNOSIS — R972 Elevated prostate specific antigen [PSA]: Secondary | ICD-10-CM

## 2012-01-13 LAB — COMPREHENSIVE METABOLIC PANEL
ALT: 28 U/L (ref 0–53)
AST: 33 U/L (ref 0–37)
Alkaline Phosphatase: 80 U/L (ref 39–117)
BUN: 23 mg/dL (ref 6–23)
Creatinine, Ser: 1.3 mg/dL (ref 0.4–1.5)
Potassium: 4.3 mEq/L (ref 3.5–5.1)

## 2012-01-13 LAB — MICROALBUMIN / CREATININE URINE RATIO: Creatinine,U: 315.2 mg/dL

## 2012-01-20 ENCOUNTER — Ambulatory Visit (INDEPENDENT_AMBULATORY_CARE_PROVIDER_SITE_OTHER): Payer: Managed Care, Other (non HMO) | Admitting: Family Medicine

## 2012-01-20 ENCOUNTER — Encounter: Payer: Self-pay | Admitting: Family Medicine

## 2012-01-20 VITALS — BP 122/68 | HR 72 | Temp 97.3°F | Wt 182.2 lb

## 2012-01-20 DIAGNOSIS — R972 Elevated prostate specific antigen [PSA]: Secondary | ICD-10-CM

## 2012-01-20 DIAGNOSIS — N289 Disorder of kidney and ureter, unspecified: Secondary | ICD-10-CM

## 2012-01-20 DIAGNOSIS — R21 Rash and other nonspecific skin eruption: Secondary | ICD-10-CM | POA: Insufficient documentation

## 2012-01-20 MED ORDER — TRIAMCINOLONE ACETONIDE 0.1 % EX CREA: TOPICAL_CREAM | Freq: Two times a day (BID) | CUTANEOUS | Status: DC

## 2012-01-20 NOTE — Assessment & Plan Note (Signed)
Anticipate allergic rxn to insect bite. No evidence of infection. Treat with TCI cream. Update if worsening.  rec avoid scratching as able.

## 2012-01-20 NOTE — Assessment & Plan Note (Signed)
Likely due to BPH, given fmhx merits close f/u.  Will recheck at CPE.

## 2012-01-20 NOTE — Patient Instructions (Addendum)
Prostate is looking much better. I recommend rechecking at next physical. Good to see you today, call us with quesiton. Try steroid medication for bite to help with itching. return for physical.

## 2012-01-20 NOTE — Assessment & Plan Note (Signed)
Improved.  Recheck next CPE. discussed avoidance of NSAIDs and staying hydrated (states already does this.) U microalb normal.

## 2012-01-20 NOTE — Progress Notes (Signed)
  Subjective:    Patient ID: William Shelton, male    DOB: October 23, 1950, 61 y.o.   MRN: [id number]  HPI CC: 3 mo f/u  I asked William Shelton to return today to recheck prostate and kidneys and discuss results.  Overall things back to normal.  Did have enlarged prostate but never any sxs.  Would like spot on back checked - ?chigger bite.  Has had several tick bites recently on right leg (2-3 wks ago).  No fevers/chills, new rashes.  Treated with neosporin and resolved.  Beekeeper, around tall grass.  Review of Systems Per HPI    Objective:   Physical Exam  Nursing note and vitals reviewed. Constitutional: He appears well-developed and well-nourished. No distress.  Skin: Skin is warm and dry. Rash (R lower lateral back with pruritic erythematous slightly papular rash around bite mark) noted.          Assessment & Plan:

## 2012-04-28 ENCOUNTER — Ambulatory Visit: Payer: Managed Care, Other (non HMO)

## 2012-04-29 ENCOUNTER — Ambulatory Visit (INDEPENDENT_AMBULATORY_CARE_PROVIDER_SITE_OTHER): Payer: Managed Care, Other (non HMO) | Admitting: Family Medicine

## 2012-04-29 ENCOUNTER — Ambulatory Visit (INDEPENDENT_AMBULATORY_CARE_PROVIDER_SITE_OTHER)
Admission: RE | Admit: 2012-04-29 | Discharge: 2012-04-29 | Disposition: A | Discharge status: Home / Self Care | Payer: Managed Care, Other (non HMO) | Source: Ambulatory Visit | Attending: Family Medicine | Admitting: Family Medicine

## 2012-04-29 ENCOUNTER — Encounter: Payer: Self-pay | Admitting: Family Medicine

## 2012-04-29 VITALS — BP 112/78 | HR 72 | Temp 98.1°F | Wt 182.2 lb

## 2012-04-29 DIAGNOSIS — M545 Low back pain, unspecified: Secondary | ICD-10-CM | POA: Insufficient documentation

## 2012-04-29 DIAGNOSIS — K5900 Constipation, unspecified: Secondary | ICD-10-CM | POA: Insufficient documentation

## 2012-04-29 MED ORDER — NAPROXEN 500 MG PO TABS: ORAL_TABLET | ORAL | Status: AC

## 2012-04-29 MED ORDER — CYCLOBENZAPRINE HCL 10 MG PO TABS: 10.0000 mg | ORAL_TABLET | Freq: Two times a day (BID) | ORAL | Status: AC | PRN

## 2012-04-29 NOTE — Assessment & Plan Note (Addendum)
Doubt obstruction as passing gas fine. Check abd series and lumbar spine films given associated bowel changes - no obstruction, moderate stool throughout large bowel.  Lumbar spine - well preserved intervetebral spaces.   H/o slightly elevated PSA and famhx prostate cancer, but latest PSA normal at 3.2. Lab Results  Component Value Date   PSA 3.20 01/13/2012   PSA 4.25* 10/15/2011   PSA 2.90 10/01/2010  anticipate significant lumbar strain with tight paraspinous mm on exam, treat with NSAIDs/rest, and muscle relaxant (discussed sedation precautions, to start with 1/2 pill flexeril).

## 2012-04-29 NOTE — Assessment & Plan Note (Signed)
Likely contributing to back pain but doubt etiology of pain given sharp stabbing description. No evidence of obstruction or air fluid levels on upright abd xray. Treat with miralax daily and continued colace, increase water intake. If no better, return for further evaluation.

## 2012-04-29 NOTE — Patient Instructions (Addendum)
This is likely lumbar strain leading to constipation - treat with anti inflammatory and muscle relaxant (caution - this can make you sleepy). Get plenty of water.  May start OTC miralax 1 capful daily until having good stools. If not improving with this please let us know. If not able to pas gas, or any worsening, please seek urgent care.

## 2012-04-29 NOTE — Progress Notes (Signed)
  Subjective:    Patient ID: William Shelton, male    DOB: July 29, 1951, 61 y.o.   MRN: [id number]  HPI CC: back pain, constipation  1 wk h/o lower back pain.  Started as bilateral lumbar paraspinous muscle strain/spasm, which got better.  Then went to gym and used overhead press with no weights, felt no pain but a few hours later worsening pain.  Now pain localized to left lower back.  Pressure improves pain.  No falls on back ,no injury to lower back.  No shooting pain down legs, numbness/weakness in legs, fevers/chills.  H/o slightly elevated PSA velocity, PSA actually decreased and returned to normal on last check.  Does have fmhx prostate cancer.  Sleeping on floor for last 4 nights - back feels better with this.  Constipation - worsening since back pain.  Last BM was 5d ago.  Taking colace 1-2x daily, increased water.  Not feeling urgency to have BM.  Voiding normally.  Slightly decreased appetite.  Yesterday passing gas improved pain.  Feels passing gas well.  denies abd pain, no nausea/vomiting.  Wt Readings from Last 3 Encounters:  04/29/12 182 lb 4 oz (82.668 kg)  01/20/12 182 lb 4 oz (82.668 kg)  10/22/11 182 lb 12 oz (82.895 kg)    Past Medical History  Diagnosis Date  . HLD (hyperlipidemia)   . IBS (irritable bowel syndrome)   . Allergic rhinitis, cause unspecified   . Elevated prostate specific antigen (PSA)     increased by > 10 %, then back to normal.  . Internal hemorrhoid   . BPH (benign prostatic hypertrophy)    Family History  Problem Relation Age of Onset  . Hypertension Father   . Prostate cancer Father 19    Radiation beads  . Macular degeneration Father   . Hyperlipidemia Brother   . Other Brother     BPH  . Diabetes Neg Hx   . Breast cancer Neg Hx   . Colon cancer Neg Hx   . Depression Neg Hx   . Alcohol abuse Neg Hx   . Stroke Neg Hx     Review of Systems Per HPI    Objective:   Physical Exam  Nursing note and vitals  reviewed. Constitutional: He appears well-developed and well-nourished. No distress.  HENT:  Head: Normocephalic and atraumatic.  Mouth/Throat: Oropharynx is clear and moist. No oropharyngeal exudate.  Abdominal: Soft. Normal appearance. He exhibits distension (mild). He exhibits no mass. Bowel sounds are decreased. There is no hepatosplenomegaly. There is no tenderness. There is no rigidity, no rebound, no guarding and negative Murphy's sign.  Musculoskeletal:       No midline spine tenderness L lumbar paraspinous mm tenderness/tightness Pain localized lateral to left lumbar paraspinous mm Neg SLR, no pain with int/ext rotation at hip, neg FABER No pain with palpation SIJ, GTB or sciatic notch bilaterally  Neurological:       Strength, sensation intact  Psychiatric: He has a normal mood and affect.       Assessment & Plan:

## 2012-07-22 ENCOUNTER — Ambulatory Visit (INDEPENDENT_AMBULATORY_CARE_PROVIDER_SITE_OTHER): Payer: Managed Care, Other (non HMO)

## 2012-07-22 ENCOUNTER — Ambulatory Visit: Payer: Managed Care, Other (non HMO)

## 2012-07-22 DIAGNOSIS — Z23 Encounter for immunization: Secondary | ICD-10-CM

## 2012-07-24 ENCOUNTER — Ambulatory Visit: Payer: Managed Care, Other (non HMO)

## 2014-07-31 ENCOUNTER — Other Ambulatory Visit: Payer: Self-pay | Admitting: Family Medicine

## 2014-07-31 DIAGNOSIS — E785 Hyperlipidemia, unspecified: Secondary | ICD-10-CM

## 2014-07-31 DIAGNOSIS — R972 Elevated prostate specific antigen [PSA]: Secondary | ICD-10-CM

## 2014-08-01 ENCOUNTER — Other Ambulatory Visit (INDEPENDENT_AMBULATORY_CARE_PROVIDER_SITE_OTHER): Payer: Managed Care, Other (non HMO)

## 2014-08-01 DIAGNOSIS — E785 Hyperlipidemia, unspecified: Secondary | ICD-10-CM

## 2014-08-01 DIAGNOSIS — R972 Elevated prostate specific antigen [PSA]: Secondary | ICD-10-CM

## 2014-08-01 LAB — LIPID PANEL
CHOL/HDL RATIO: 7
Cholesterol: 257 mg/dL — ABNORMAL HIGH (ref 0–200)
HDL: 37.7 mg/dL — ABNORMAL LOW (ref >39.00)
LDL Cholesterol: 189 mg/dL — ABNORMAL HIGH (ref 0–99)
NonHDL: 219.3
Triglycerides: 154 mg/dL — ABNORMAL HIGH (ref 0.0–149.0)
VLDL: 30.8 mg/dL (ref 0.0–40.0)

## 2014-08-01 LAB — BASIC METABOLIC PANEL
BUN: 24 mg/dL — AB (ref 6–23)
CHLORIDE: 106 meq/L (ref 96–112)
CO2: 29 meq/L (ref 19–32)
CREATININE: 1.5 mg/dL (ref 0.4–1.5)
Calcium: 9.4 mg/dL (ref 8.4–10.5)
GFR: 50.8 mL/min — ABNORMAL LOW (ref >60.00)
GLUCOSE: 87 mg/dL (ref 70–99)
Potassium: 4.6 mEq/L (ref 3.5–5.1)
Sodium: 141 mEq/L (ref 135–145)

## 2014-08-01 LAB — PSA: PSA: 4.06 ng/mL — AB (ref 0.10–4.00)

## 2014-08-04 ENCOUNTER — Ambulatory Visit (INDEPENDENT_AMBULATORY_CARE_PROVIDER_SITE_OTHER): Payer: Managed Care, Other (non HMO) | Admitting: Family Medicine

## 2014-08-04 ENCOUNTER — Encounter: Payer: Self-pay | Admitting: Family Medicine

## 2014-08-04 VITALS — BP 188/110 | HR 80 | Temp 98.6°F | Ht 73.0 in | Wt 185.5 lb

## 2014-08-04 DIAGNOSIS — N189 Chronic kidney disease, unspecified: Secondary | ICD-10-CM

## 2014-08-04 DIAGNOSIS — Z23 Encounter for immunization: Secondary | ICD-10-CM

## 2014-08-04 DIAGNOSIS — I10 Essential (primary) hypertension: Secondary | ICD-10-CM | POA: Insufficient documentation

## 2014-08-04 DIAGNOSIS — Z0000 Encounter for general adult medical examination without abnormal findings: Secondary | ICD-10-CM

## 2014-08-04 DIAGNOSIS — E785 Hyperlipidemia, unspecified: Secondary | ICD-10-CM

## 2014-08-04 DIAGNOSIS — R972 Elevated prostate specific antigen [PSA]: Secondary | ICD-10-CM

## 2014-08-04 HISTORY — DX: Essential (primary) hypertension: I10

## 2014-08-04 MED ORDER — EPINEPHRINE 0.3 MG/0.3ML IJ SOAJ: 0.3000 mg | Freq: Once | INTRAMUSCULAR | Status: DC

## 2014-08-04 MED ORDER — AMLODIPINE BESYLATE 5 MG PO TABS: 5.0000 mg | ORAL_TABLET | Freq: Every day | ORAL | Status: DC

## 2014-08-04 MED ORDER — AMLODIPINE BESYLATE 5 MG PO TABS: 10.0000 mg | ORAL_TABLET | Freq: Every day | ORAL | Status: DC

## 2014-08-04 NOTE — Addendum Note (Signed)
Addended by: Royann Shivers A on: 08/04/2014 04:58 PM   Modules accepted: Orders

## 2014-08-04 NOTE — Assessment & Plan Note (Signed)
Check Umicroalb next visit (unable to provide sample today). Discussed with patient dx. Anticipate stage 3 CKD due to hypertension. See below. rec avoiding NSAIDs and staying well hydrated. Consider tighter lipid control.

## 2014-08-04 NOTE — Progress Notes (Signed)
BP 188/110 mmHg  Pulse 80  Temp(Src) 98.6 F (37 C) (Oral)  Ht 6\' 1"  (1.854 m)  Wt 185 lb 8 oz (84.142 kg)  BMI 24.48 kg/m2   CC: CPE  Subjective:    Patient ID: William Shelton, male    DOB: 09/29/1950, 63 y.o.   MRN: [id number]  HPI: William Shelton is a 63 y.o. male presenting on 08/04/2014 for Annual Exam  BP Readings from Last 3 Encounters:  08/04/14 188/110  04/29/12 112/78  01/20/12 122/68  HTN - "creeping up" over last few years. At home monitors bp, has been running elevated as well. No HA, vision changes, CP/tightness, SOB, leg swelling.   fmhx prostate cancer - father treated with seed implants and recently deceased from natural causes (38yo), great grandfather also with h/o prostate cancer.  Preventative: Colonoscopy - 2009, normal Prostate - checks yearly. h/o low risk velocity.Strong stream.No nocturia. + fmhx in form of father with prostate cancer dx age 58s, controlled with seeds. Flu shot today Td 2009. zostavax - desires today.  Married; lives with wife 1 son Retired Forensic psychologist Activity: walks daily Diet: good water, fruits/vegetables daily  Lab Results  Component Value Date   PSA 4.06* 08/01/2014   PSA 3.20 01/13/2012   PSA 4.25* 10/15/2011    Relevant past medical, surgical, family and social history reviewed and updated as indicated.  Allergies and medications reviewed and updated. No current outpatient prescriptions on file prior to visit.   No current facility-administered medications on file prior to visit.    Review of Systems  Constitutional: Negative for fever, chills, activity change, appetite change, fatigue and unexpected weight change.  HENT: Negative for hearing loss.   Eyes: Negative for visual disturbance.  Respiratory: Negative for cough, chest tightness, shortness of breath and wheezing.   Cardiovascular: Negative for chest pain, palpitations and leg  swelling.  Gastrointestinal: Negative for nausea, vomiting, abdominal pain, diarrhea, constipation, blood in stool and abdominal distention.  Genitourinary: Negative for hematuria and difficulty urinating.  Musculoskeletal: Negative for myalgias, arthralgias and neck pain.  Skin: Negative for rash.  Neurological: Negative for dizziness, seizures, syncope and headaches.  Hematological: Negative for adenopathy. Does not bruise/bleed easily.  Psychiatric/Behavioral: Negative for dysphoric mood. The patient is not nervous/anxious.    Per HPI unless specifically indicated above    Objective:    BP 188/110 mmHg  Pulse 80  Temp(Src) 98.6 F (37 C) (Oral)  Ht 6\' 1"  (1.854 m)  Wt 185 lb 8 oz (84.142 kg)  BMI 24.48 kg/m2  Physical Exam  Constitutional: He is oriented to person, place, and time. He appears well-developed and well-nourished. No distress.  HENT:  Head: Normocephalic and atraumatic.  Right Ear: Hearing, tympanic membrane, external ear and ear canal normal.  Left Ear: Hearing, tympanic membrane, external ear and ear canal normal.  Nose: Nose normal.  Mouth/Throat: Uvula is midline, oropharynx is clear and moist and mucous membranes are normal. No oropharyngeal exudate, posterior oropharyngeal edema or posterior oropharyngeal erythema.  Eyes: Conjunctivae and EOM are normal. Pupils are equal, round, and reactive to light. No scleral icterus.  Neck: Normal range of motion. Neck supple. No thyromegaly present.  Cardiovascular: Normal rate, regular rhythm, normal heart sounds and intact distal pulses.   No murmur heard. Pulses:  Radial pulses are 2+ on the right side, and 2+ on the left side.  Pulmonary/Chest: Effort normal and breath sounds normal. No respiratory distress. He has no wheezes. He has no rales.  Abdominal: Soft. Bowel sounds are normal. He exhibits no distension and no mass. There is no tenderness. There is no rebound and no guarding.  Genitourinary: Prostate  normal. Rectal exam shows external hemorrhoid (noninflammed). Rectal exam shows no internal hemorrhoid, no fissure, no mass, no tenderness and anal tone normal. Prostate is not enlarged (20gm) and not tender.  Non indurated prostate without tenderness or nodules  Musculoskeletal: Normal range of motion. He exhibits no edema.  Lymphadenopathy:    He has no cervical adenopathy.  Neurological: He is alert and oriented to person, place, and time.  CN grossly intact, station and gait intact  Skin: Skin is warm and dry. No rash noted.  Psychiatric: He has a normal mood and affect. His behavior is normal. Judgment and thought content normal.  Nursing note and vitals reviewed.  Results for orders placed or performed in visit on 08/01/14  Lipid panel  Result Value Ref Range   Cholesterol 257 (H) 0 - 200 mg/dL   Triglycerides 154.0 (H) 0.0 - 149.0 mg/dL   HDL 37.70 (L) >39.00 mg/dL   VLDL 30.8 0.0 - 40.0 mg/dL   LDL Cholesterol 189 (H) 0 - 99 mg/dL   Total CHOL/HDL Ratio 7    NonHDL 219.30   PSA  Result Value Ref Range   PSA 4.06 (H) 0.10 - 4.00 ng/mL  Basic metabolic panel  Result Value Ref Range   Sodium 141 135 - 145 mEq/L   Potassium 4.6 3.5 - 5.1 mEq/L   Chloride 106 96 - 112 mEq/L   CO2 29 19 - 32 mEq/L   Glucose, Bld 87 70 - 99 mg/dL   BUN 24 (H) 6 - 23 mg/dL   Creatinine, Ser 1.5 0.4 - 1.5 mg/dL   Calcium 9.4 8.4 - 10.5 mg/dL   GFR 50.80 (L) >60.00 mL/min      Assessment & Plan:   Problem List Items Addressed This Visit    HLD (hyperlipidemia)    Reviewed #s with patient, discussed increased aerobic exercise to raise HDL and increased legumes and whole grains to lower LDL.    Relevant Medications      EPINEPHrine 0.3 mg/0.3 mL IJ SOAJ injection      amLODIpine (NORVASC) tablet   Health maintenance examination - Primary    Preventative protocols reviewed and updated unless pt declined. Discussed healthy diet and lifestyle.    Essential hypertension    Reviewed new dx  with patient-  Discussed elevated readings. Start amlodipine 5mg  for 1 wk then increase to 10mg  daily. rtc 1 mo for f/u visit, EKG, and microalbumin. Pt agrees with plan. Educational handout on DASH diet provided as well as [employer].    Relevant Medications      EPINEPHrine 0.3 mg/0.3 mL IJ SOAJ injection      amLODIpine (NORVASC) tablet   Elevated prostate specific antigen (PSA)    Again elevated, but exam WNL. Does have fmhx prostate cancer. Will check PSA along with free % in 3 months to monitor elevated reading. If remaining elevated, will refer to urology. Pt agrees with plan.    Chronic kidney disease    Check Umicroalb next visit (unable to provide sample today). Discussed with patient dx. Anticipate stage 3 CKD due to hypertension. See below. rec avoiding NSAIDs and staying well  hydrated. Consider tighter lipid control.        Follow up plan: Return in about 1 month (around 09/03/2014), or as needed, for follow up visit.

## 2014-08-04 NOTE — Progress Notes (Signed)
Pre visit review using our clinic review tool, if applicable. No additional management support is needed unless otherwise documented below in the visit note. 

## 2014-08-04 NOTE — Assessment & Plan Note (Signed)
Reviewed #s with patient, discussed increased aerobic exercise to raise HDL and increased legumes and whole grains to lower LDL.

## 2014-08-04 NOTE — Assessment & Plan Note (Signed)
Again elevated, but exam WNL. Does have fmhx prostate cancer. Will check PSA along with free % in 3 months to monitor elevated reading. If remaining elevated, will refer to urology. Pt agrees with plan.

## 2014-08-04 NOTE — Assessment & Plan Note (Addendum)
Reviewed new dx with patient-  Discussed elevated readings. Start amlodipine 5mg  for 1 wk then increase to 10mg  daily. rtc 1 mo for f/u visit, EKG, and microalbumin. Pt agrees with plan. Educational handout on DASH diet provided as well as [employer].

## 2014-08-04 NOTE — Patient Instructions (Addendum)
zostavax and flu shot today. Good to see you today, call us with questions. Recheck prostate level in 3 months. Blood pressure is too high! Start amlodipine 5mg  daily and after 1 week if tolerated well increase to 10mg  daily. Return in 1 month for follow up blood pressure visit.  Your goal blood pressure is 135/85.  Work on low salt/sodium diet - goal <1.5gm (1,500mg ) per day. Eat a diet high in fruits/vegetables and whole grains.  Look into mediterranean and DASH diet. Goal activity is 173min/wk of moderate intensity exercise.  This can be split into 30 minute chunks.   Look at [employer] for more resources  DASH Eating Plan DASH stands for "Dietary Approaches to Stop Hypertension." The DASH eating plan is a healthy eating plan that has been shown to reduce high blood pressure (hypertension). Additional health benefits may include reducing the risk of type 2 diabetes mellitus, heart disease, and stroke. The DASH eating plan may also help with weight loss. WHAT DO I NEED TO KNOW ABOUT THE DASH EATING PLAN? For the DASH eating plan, you will follow these general guidelines:  Choose foods with a percent daily value for sodium of less than 5% (as listed on the food label).  Use salt-free seasonings or herbs instead of table salt or sea salt.  Check with your health care provider or pharmacist before using salt substitutes.  Eat lower-sodium products, often labeled as "lower sodium" or "no salt added."  Eat fresh foods.  Eat more vegetables, fruits, and low-fat dairy products.  Choose whole grains. Look for the word "whole" as the first word in the ingredient list.  Choose fish and skinless chicken or [place] more often than red meat. Limit fish, poultry, and meat to 6 oz (170 g) each day.  Limit sweets, desserts, sugars, and sugary drinks.  Choose heart-healthy fats.  Limit cheese to 1 oz (28 g) per day.  Eat more home-cooked food and less restaurant, buffet, and fast  food.  Limit fried foods.  Cook foods using methods other than frying.  Limit canned vegetables. If you do use them, rinse them well to decrease the sodium.  When eating at a restaurant, ask that your food be prepared with less salt, or no salt if possible. WHAT FOODS CAN I EAT? Seek help from a dietitian for individual calorie needs. Grains Whole grain or whole wheat bread. Brown rice. Whole grain or whole wheat pasta. Quinoa, bulgur, and whole grain cereals. Low-sodium cereals. Corn or whole wheat flour tortillas. Whole grain cornbread. Whole grain crackers. Low-sodium crackers. Vegetables Fresh or frozen vegetables (raw, steamed, roasted, or grilled). Low-sodium or reduced-sodium tomato and vegetable juices. Low-sodium or reduced-sodium tomato sauce and paste. Low-sodium or reduced-sodium canned vegetables.  Fruits All fresh, canned (in natural juice), or frozen fruits. Meat and Other Protein Products Ground beef (85% or leaner), grass-fed beef, or beef trimmed of fat. Skinless chicken or [place]. Ground chicken or [place]. Pork trimmed of fat. All fish and seafood. Eggs. Dried beans, peas, or lentils. Unsalted nuts and seeds. Unsalted canned beans. Dairy Low-fat dairy products, such as skim or 1% milk, 2% or reduced-fat cheeses, low-fat ricotta or cottage cheese, or plain low-fat yogurt. Low-sodium or reduced-sodium cheeses. Fats and Oils Tub margarines without trans fats. Light or reduced-fat mayonnaise and salad dressings (reduced sodium). Avocado. Safflower, olive, or canola oils. Natural peanut or almond butter. Other Unsalted popcorn and pretzels. The items listed above may not be a complete list of recommended foods or beverages. Contact  your dietitian for more options. WHAT FOODS ARE NOT RECOMMENDED? Grains White bread. White pasta. White rice. Refined cornbread. Bagels and croissants. Crackers that contain trans fat. Vegetables Creamed or fried vegetables. Vegetables in a  cheese sauce. Regular canned vegetables. Regular canned tomato sauce and paste. Regular tomato and vegetable juices. Fruits Dried fruits. Canned fruit in light or heavy syrup. Fruit juice. Meat and Other Protein Products Fatty cuts of meat. Ribs, chicken wings, bacon, sausage, bologna, salami, chitterlings, fatback, hot dogs, bratwurst, and packaged luncheon meats. Salted nuts and seeds. Canned beans with salt. Dairy Whole or 2% milk, cream, half-and-half, and cream cheese. Whole-fat or sweetened yogurt. Full-fat cheeses or blue cheese. Nondairy creamers and whipped toppings. Processed cheese, cheese spreads, or cheese curds. Condiments Onion and garlic salt, seasoned salt, table salt, and sea salt. Canned and packaged gravies. Worcestershire sauce. Tartar sauce. Barbecue sauce. Teriyaki sauce. Soy sauce, including reduced sodium. Steak sauce. Fish sauce. Oyster sauce. Cocktail sauce. Horseradish. Ketchup and mustard. Meat flavorings and tenderizers. Bouillon cubes. Hot sauce. Tabasco sauce. Marinades. Taco seasonings. Relishes. Fats and Oils Butter, stick margarine, lard, shortening, ghee, and bacon fat. Coconut, palm kernel, or palm oils. Regular salad dressings. Other Pickles and olives. Salted popcorn and pretzels. The items listed above may not be a complete list of foods and beverages to avoid. Contact your dietitian for more information. WHERE CAN I FIND MORE INFORMATION? National Heart, Lung, and Blood Institute: travelstabloid.com Document Released: 08/22/2011 Document Revised: 01/17/2014 Document Reviewed: 07/07/2013 [facility] Patient Information 2015 [place], [place]. This information is not intended to replace advice given to you by your health care provider. Make sure you discuss any questions you have with your health care provider.

## 2014-08-04 NOTE — Assessment & Plan Note (Signed)
Preventative protocols reviewed and updated unless pt declined. Discussed healthy diet and lifestyle.  

## 2014-08-05 ENCOUNTER — Telehealth: Payer: Self-pay

## 2014-08-05 NOTE — Telephone Encounter (Signed)
Pt said when picked up amlodipine 5mg  pt was told by pharmacy that 2 prescriptions had been sent in but the second rx instructions were take 2 tabs by mouth daily; 1st week take one tab daily; pt wanted to verify this is the directions Dr Darnell Level wants pt to follow.Please advise.

## 2014-08-05 NOTE — Telephone Encounter (Signed)
Patient notified

## 2014-08-05 NOTE — Telephone Encounter (Signed)
Yes let's do 5mg  1 tab daily for 1 wk then increase to 2 tabs daily

## 2014-09-01 ENCOUNTER — Encounter: Payer: Self-pay | Admitting: Family Medicine

## 2014-09-01 ENCOUNTER — Ambulatory Visit (INDEPENDENT_AMBULATORY_CARE_PROVIDER_SITE_OTHER): Payer: Managed Care, Other (non HMO) | Admitting: Family Medicine

## 2014-09-01 VITALS — BP 127/87 | HR 64 | Temp 98.0°F | Wt 184.0 lb

## 2014-09-01 DIAGNOSIS — N189 Chronic kidney disease, unspecified: Secondary | ICD-10-CM

## 2014-09-01 DIAGNOSIS — R972 Elevated prostate specific antigen [PSA]: Secondary | ICD-10-CM

## 2014-09-01 DIAGNOSIS — I10 Essential (primary) hypertension: Secondary | ICD-10-CM

## 2014-09-01 LAB — MICROALBUMIN / CREATININE URINE RATIO
CREATININE, U: 258.2 mg/dL
MICROALB/CREAT RATIO: 1.2 mg/g (ref 0.0–30.0)
Microalb, Ur: 3.1 mg/dL — ABNORMAL HIGH (ref 0.0–1.9)

## 2014-09-01 MED ORDER — AMLODIPINE BESYLATE 10 MG PO TABS: 10.0000 mg | ORAL_TABLET | Freq: Every day | ORAL | Status: DC

## 2014-09-01 NOTE — Progress Notes (Signed)
BP 127/87 mmHg  Pulse 64  Temp(Src) 98 F (36.7 C) (Oral)  Wt 184 lb (83.462 kg)  SpO2 99%   CC: 1 mo f/u bp  Subjective:    Patient ID: William Shelton, male    DOB: Dec 29, 1950, 63 y.o.   MRN: [id number]  HPI: William Shelton is a 63 y.o. male presenting on 09/01/2014 for Follow-up   HTN - Compliant with current antihypertensive regimen of amlodipine 10mg  daily. Notes some grogginess on bp med. Does check blood pressures at home: brings log showing bp 120-130/80s, HR 60-70s.  No low blood pressure readings or symptoms of dizziness/syncope.  Denies HA, vision changes, CP/tightness, SOB, leg swelling.   Relevant past medical, surgical, family and social history reviewed and updated as indicated. Interim medical history since our last visit reviewed. Allergies and medications reviewed and updated. Current Outpatient Prescriptions on File Prior to Visit  Medication Sig  . EPINEPHrine 0.3 mg/0.3 mL IJ SOAJ injection Inject 0.3 mLs (0.3 mg total) into the muscle once.   No current facility-administered medications on file prior to visit.    Review of Systems Per HPI unless specifically indicated above    Objective:    BP 127/87 mmHg  Pulse 64  Temp(Src) 98 F (36.7 C) (Oral)  Wt 184 lb (83.462 kg)  SpO2 99%  Wt Readings from Last 3 Encounters:  09/01/14 184 lb (83.462 kg)  08/04/14 185 lb 8 oz (84.142 kg)  04/29/12 182 lb 4 oz (82.668 kg)    Physical Exam  Constitutional: He appears well-developed and well-nourished. No distress.  HENT:  Mouth/Throat: Oropharynx is clear and moist. No oropharyngeal exudate.  Cardiovascular: Normal rate, regular rhythm, normal heart sounds and intact distal pulses.   No murmur heard. Pulmonary/Chest: Effort normal and breath sounds normal. No respiratory distress. He has no wheezes. He has no rales.  Musculoskeletal: He exhibits no edema.  Skin: Skin is warm and dry. No rash noted.  Psychiatric: He has a normal mood and affect.    Nursing note and vitals reviewed.  Results for orders placed or performed in visit on 08/01/14  Lipid panel  Result Value Ref Range   Cholesterol 257 (H) 0 - 200 mg/dL   Triglycerides 154.0 (H) 0.0 - 149.0 mg/dL   HDL 37.70 (L) >39.00 mg/dL   VLDL 30.8 0.0 - 40.0 mg/dL   LDL Cholesterol 189 (H) 0 - 99 mg/dL   Total CHOL/HDL Ratio 7    NonHDL 219.30   PSA  Result Value Ref Range   PSA 4.06 (H) 0.10 - 4.00 ng/mL  Basic metabolic panel  Result Value Ref Range   Sodium 141 135 - 145 mEq/L   Potassium 4.6 3.5 - 5.1 mEq/L   Chloride 106 96 - 112 mEq/L   CO2 29 19 - 32 mEq/L   Glucose, Bld 87 70 - 99 mg/dL   BUN 24 (H) 6 - 23 mg/dL   Creatinine, Ser 1.5 0.4 - 1.5 mg/dL   Calcium 9.4 8.4 - 10.5 mg/dL   GFR 50.80 (L) >60.00 mL/min      Assessment & Plan:   Problem List Items Addressed This Visit    Essential hypertension - Primary    Chronic, stable based on home log. No changes today. Continue regimen. Baseline EKG today - NSR rate 60, LAD, normal intervals, no acute ST/T changes    Relevant Medications      amLODIpine (NORVASC) tablet   Other Relevant Orders  Renal Function Panel      EKG 12-Lead (Completed)   Elevated prostate specific antigen (PSA)    Pt requests deferring recheck today - will return in 2 mo (10/2014) for PSA with reflex to free     Relevant Orders      PSA Total (Reflex To Free)   Chronic kidney disease    Anticipate hypertensive nephropathy. Check urine microalb today    Relevant Orders      Microalbumin / creatinine urine ratio      Renal Function Panel       Follow up plan: Return in about 6 months (around 03/03/2015), or as needed, for follow up visit.

## 2014-09-01 NOTE — Assessment & Plan Note (Signed)
Anticipate hypertensive nephropathy. Check urine microalb today

## 2014-09-01 NOTE — Assessment & Plan Note (Signed)
Pt requests deferring recheck today - will return in 2 mo (10/2014) for PSA with reflex to free

## 2014-09-01 NOTE — Patient Instructions (Addendum)
EKG today. Urine checked today. Return in 2 months (February) for lab visit only to recheck kidneys and prostate. Return in 6 months for blood pressure recheck.

## 2014-09-01 NOTE — Assessment & Plan Note (Signed)
Chronic, stable based on home log. No changes today. Continue regimen. Baseline EKG today - NSR rate 60, LAD, normal intervals, no acute ST/T changes

## 2014-09-01 NOTE — Progress Notes (Signed)
Pre visit review using our clinic review tool, if applicable. No additional management support is needed unless otherwise documented below in the visit note. 

## 2014-09-02 ENCOUNTER — Telehealth: Payer: Self-pay | Admitting: Family Medicine

## 2014-09-02 NOTE — Telephone Encounter (Signed)
emmi emailed °

## 2014-09-14 ENCOUNTER — Ambulatory Visit: Payer: Managed Care, Other (non HMO) | Admitting: Family Medicine

## 2014-09-16 DIAGNOSIS — R972 Elevated prostate specific antigen [PSA]: Secondary | ICD-10-CM

## 2014-09-16 HISTORY — DX: Elevated prostate specific antigen (PSA): R97.20

## 2014-11-02 ENCOUNTER — Other Ambulatory Visit (INDEPENDENT_AMBULATORY_CARE_PROVIDER_SITE_OTHER): Payer: 59

## 2014-11-02 DIAGNOSIS — N189 Chronic kidney disease, unspecified: Secondary | ICD-10-CM

## 2014-11-02 DIAGNOSIS — I10 Essential (primary) hypertension: Secondary | ICD-10-CM

## 2014-11-02 DIAGNOSIS — R972 Elevated prostate specific antigen [PSA]: Secondary | ICD-10-CM

## 2014-11-02 LAB — RENAL FUNCTION PANEL
Albumin: 4.4 g/dL (ref 3.5–5.2)
BUN: 28 mg/dL — ABNORMAL HIGH (ref 6–23)
CO2: 28 mEq/L (ref 19–32)
CREATININE: 1.48 mg/dL (ref 0.40–1.50)
Calcium: 9.6 mg/dL (ref 8.4–10.5)
Chloride: 103 mEq/L (ref 96–112)
GFR: 50.76 mL/min — ABNORMAL LOW (ref >60.00)
GLUCOSE: 91 mg/dL (ref 70–99)
Phosphorus: 3.1 mg/dL (ref 2.3–4.6)
Potassium: 3.8 mEq/L (ref 3.5–5.1)
SODIUM: 139 meq/L (ref 135–145)

## 2014-11-03 LAB — FPSA% REFLEX
% FREE PSA: 25.7 %
PSA, FREE: 1.13 ng/mL

## 2014-11-03 LAB — PSA TOTAL (REFLEX TO FREE): PSA: 4.4 ng/mL — ABNORMAL HIGH (ref 0.0–4.0)

## 2014-11-04 ENCOUNTER — Encounter: Payer: Self-pay | Admitting: *Deleted

## 2014-11-04 ENCOUNTER — Other Ambulatory Visit: Payer: Self-pay | Admitting: Family Medicine

## 2014-11-04 DIAGNOSIS — R972 Elevated prostate specific antigen [PSA]: Secondary | ICD-10-CM

## 2014-12-04 ENCOUNTER — Encounter: Payer: Self-pay | Admitting: Family Medicine

## 2014-12-22 ENCOUNTER — Other Ambulatory Visit: Payer: Self-pay | Admitting: Family Medicine

## 2015-01-19 ENCOUNTER — Encounter: Payer: Self-pay | Admitting: Family Medicine

## 2015-03-03 ENCOUNTER — Ambulatory Visit (INDEPENDENT_AMBULATORY_CARE_PROVIDER_SITE_OTHER): Payer: 59 | Admitting: Family Medicine

## 2015-03-03 ENCOUNTER — Encounter: Payer: Self-pay | Admitting: Family Medicine

## 2015-03-03 VITALS — BP 118/84 | HR 72 | Temp 97.6°F | Ht 73.0 in | Wt 181.4 lb

## 2015-03-03 DIAGNOSIS — N189 Chronic kidney disease, unspecified: Secondary | ICD-10-CM | POA: Diagnosis not present

## 2015-03-03 DIAGNOSIS — R972 Elevated prostate specific antigen [PSA]: Secondary | ICD-10-CM | POA: Diagnosis not present

## 2015-03-03 DIAGNOSIS — N183 Chronic kidney disease, stage 3 unspecified: Secondary | ICD-10-CM

## 2015-03-03 DIAGNOSIS — I10 Essential (primary) hypertension: Secondary | ICD-10-CM

## 2015-03-03 LAB — CBC WITH DIFFERENTIAL/PLATELET
BASOS PCT: 0.8 % (ref 0.0–3.0)
Basophils Absolute: 0.1 10*3/uL (ref 0.0–0.1)
EOS PCT: 7.2 % — AB (ref 0.0–5.0)
Eosinophils Absolute: 0.6 10*3/uL (ref 0.0–0.7)
HEMATOCRIT: 44.8 % (ref 39.0–52.0)
Hemoglobin: 14.9 g/dL (ref 13.0–17.0)
LYMPHS ABS: 2.1 10*3/uL (ref 0.7–4.0)
Lymphocytes Relative: 27.5 % (ref 12.0–46.0)
MCHC: 33.3 g/dL (ref 30.0–36.0)
MCV: 90.1 fl (ref 78.0–100.0)
Monocytes Absolute: 0.8 10*3/uL (ref 0.1–1.0)
Monocytes Relative: 10 % (ref 3.0–12.0)
Neutro Abs: 4.3 10*3/uL (ref 1.4–7.7)
Neutrophils Relative %: 54.5 % (ref 43.0–77.0)
Platelets: 250 10*3/uL (ref 150.0–400.0)
RBC: 4.98 Mil/uL (ref 4.22–5.81)
RDW: 13.1 % (ref 11.5–15.5)
WBC: 7.8 10*3/uL (ref 4.0–10.5)

## 2015-03-03 LAB — RENAL FUNCTION PANEL
Albumin: 4.5 g/dL (ref 3.5–5.2)
BUN: 21 mg/dL (ref 6–23)
CHLORIDE: 104 meq/L (ref 96–112)
CO2: 29 meq/L (ref 19–32)
Calcium: 9.7 mg/dL (ref 8.4–10.5)
Creatinine, Ser: 1.4 mg/dL (ref 0.40–1.50)
GFR: 54.07 mL/min — AB (ref >60.00)
Glucose, Bld: 94 mg/dL (ref 70–99)
POTASSIUM: 4.4 meq/L (ref 3.5–5.1)
Phosphorus: 2.5 mg/dL (ref 2.3–4.6)
SODIUM: 138 meq/L (ref 135–145)

## 2015-03-03 LAB — VITAMIN D 25 HYDROXY (VIT D DEFICIENCY, FRACTURES): VITD: 23.42 ng/mL — ABNORMAL LOW (ref 30.00–100.00)

## 2015-03-03 LAB — TSH: TSH: 3.34 u[IU]/mL (ref 0.35–4.50)

## 2015-03-03 NOTE — Assessment & Plan Note (Signed)
Chronic, stable based on log he brings. Continue amlodipine regimen.

## 2015-03-03 NOTE — Patient Instructions (Signed)
Blood work today. You are doing well. Return as needed or in 6 months for physical.

## 2015-03-03 NOTE — Progress Notes (Signed)
Pre visit review using our clinic review tool, if applicable. No additional management support is needed unless otherwise documented below in the visit note. 

## 2015-03-03 NOTE — Progress Notes (Signed)
BP 118/84 mmHg  Pulse 72  Temp(Src) 97.6 F (36.4 C) (Oral)  Ht 6\' 1"  (1.854 m)  Wt 181 lb 6.4 oz (82.283 kg)  BMI 23.94 kg/m2  SpO2 98%   CC: 6 mo f/u visit  Subjective:    Patient ID: William Shelton, male    DOB: 1951/01/15, 64 y.o.   MRN: [id number]  HPI: William Shelton is a 64 y.o. male presenting on 03/03/2015 for Follow-up and Hypertension   HTN - Compliant with current antihypertensive regimen of amlodipine 10mg  daily. Brings log showing bp ranging 110-130/70-80s. Occasional 140/90s, one isolated high to 164/106. HR 60-70s.No low blood pressure readings or symptoms of dizziness/syncope. Denies HA, vision changes, CP/tightness, SOB, leg swelling.   Elevated PSA - s/p benign biopsy by Dr Erlene Quan with prostate mass 34.54 mL. Also told had cyst. Seeing once yearly.   Relevant past medical, surgical, family and social history reviewed and updated as indicated. Interim medical history since our last visit reviewed. Allergies and medications reviewed and updated. Current Outpatient Prescriptions on File Prior to Visit  Medication Sig  . amLODipine (NORVASC) 10 MG tablet Take 1 tablet (10 mg total) by mouth daily. Take 1 tablet daily for the frist week.  Marland Kitchen EPINEPHrine 0.3 mg/0.3 mL IJ SOAJ injection Inject 0.3 mLs (0.3 mg total) into the muscle once.   No current facility-administered medications on file prior to visit.    Review of Systems Per HPI unless specifically indicated above     Objective:    BP 118/84 mmHg  Pulse 72  Temp(Src) 97.6 F (36.4 C) (Oral)  Ht 6\' 1"  (1.854 m)  Wt 181 lb 6.4 oz (82.283 kg)  BMI 23.94 kg/m2  SpO2 98%  Wt Readings from Last 3 Encounters:  03/03/15 181 lb 6.4 oz (82.283 kg)  09/01/14 184 lb (83.462 kg)  08/04/14 185 lb 8 oz (84.142 kg)    Physical Exam  Constitutional: He appears well-developed and well-nourished. No distress.  HENT:  Mouth/Throat: Oropharynx is clear and moist. No oropharyngeal exudate.    Cardiovascular: Normal rate, regular rhythm, normal heart sounds and intact distal pulses.   No murmur heard. Pulmonary/Chest: Effort normal and breath sounds normal. No respiratory distress. He has no wheezes. He has no rales.  Musculoskeletal: He exhibits no edema.  Psychiatric: He has a normal mood and affect.  Nursing note and vitals reviewed.  Results for orders placed or performed in visit on 11/02/14  PSA Total (Reflex To Free)  Result Value Ref Range   PSA 4.4 (H) 0.0 - 4.0 ng/mL   REFLEX CRITERIA Comment   Renal Function Panel  Result Value Ref Range   Sodium 139 135 - 145 mEq/L   Potassium 3.8 3.5 - 5.1 mEq/L   Chloride 103 96 - 112 mEq/L   CO2 28 19 - 32 mEq/L   Calcium 9.6 8.4 - 10.5 mg/dL   Albumin 4.4 3.5 - 5.2 g/dL   BUN 28 (H) 6 - 23 mg/dL   Creatinine, Ser 1.48 0.40 - 1.50 mg/dL   Glucose, Bld 91 70 - 99 mg/dL   Phosphorus 3.1 2.3 - 4.6 mg/dL   GFR 50.76 (L) >60.00 mL/min  %fPSA Reflex  Result Value Ref Range   PSA, FREE 1.13 N/A ng/mL   % FREE PSA 25.7 %      Assessment & Plan:   Problem List Items Addressed This Visit    Chronic kidney disease (CKD), stage III (moderate)    Recheck renal panel  today as well as vit D, CBC and PTH.       Elevated prostate specific antigen (PSA)    Appreciate urology care, sees Q6 mo. Endorses recent benign biopsy      Essential hypertension - Primary    Chronic, stable based on log he brings. Continue amlodipine regimen.      Relevant Orders   Renal function panel       Follow up plan: Return in about 6 months (around 09/02/2015), or as needed, for annual exam, prior fasting for blood work.

## 2015-03-03 NOTE — Assessment & Plan Note (Signed)
Recheck renal panel today as well as vit D, CBC and PTH.

## 2015-03-03 NOTE — Assessment & Plan Note (Addendum)
Appreciate urology care, sees Q6 mo. Endorses recent benign biopsy

## 2015-03-06 LAB — PARATHYROID HORMONE, INTACT (NO CA): PTH: 36 pg/mL (ref 14–64)

## 2015-03-07 ENCOUNTER — Other Ambulatory Visit: Payer: Self-pay | Admitting: Family Medicine

## 2015-03-07 ENCOUNTER — Encounter: Payer: Self-pay | Admitting: Family Medicine

## 2015-03-07 MED ORDER — VITAMIN D3 25 MCG (1000 UT) PO CAPS: 1.0000 | ORAL_CAPSULE | Freq: Every day | ORAL | Status: DC

## 2015-03-27 ENCOUNTER — Encounter: Payer: Self-pay | Admitting: Gastroenterology

## 2015-07-21 ENCOUNTER — Ambulatory Visit: Payer: Self-pay

## 2015-07-26 ENCOUNTER — Encounter: Payer: Self-pay | Admitting: Urology

## 2015-07-27 ENCOUNTER — Ambulatory Visit (INDEPENDENT_AMBULATORY_CARE_PROVIDER_SITE_OTHER): Payer: PPO | Admitting: Urology

## 2015-07-27 ENCOUNTER — Encounter: Payer: Self-pay | Admitting: Urology

## 2015-07-27 VITALS — BP 135/83 | HR 70 | Ht 72.0 in | Wt 183.1 lb

## 2015-07-27 DIAGNOSIS — R972 Elevated prostate specific antigen [PSA]: Secondary | ICD-10-CM

## 2015-07-27 NOTE — Progress Notes (Signed)
07/27/2015 3:25 PM   William Shelton December 05, 1950 [id number]  Referring provider: Ria Bush, MD [address] [place], [place] [address]  Chief Complaint  Patient presents with  . Follow-up    elevated PSA    HPI: The patient is a 64 year old gentleman who presents for follow-up of an elevated PSA.  His last PSA was 4.4 in February 2016. He underwent a prostate biopsy at that time was unremarkable.  His PSA trend is as follows: 4.20 October 2014 4.22 July 2014 2.04 2014 4.25 2013  He has a significant family history of prostate cancer in his father, grandfather, great-grandfather. His father was diagnosed in his 77s with prostate cancer and died at age 34 of other etiologies.   PMH: Past Medical History  Diagnosis Date  . HLD (hyperlipidemia)   . IBS (irritable bowel syndrome)   . Allergic rhinitis, cause unspecified   . Internal hemorrhoid   . BPH (benign prostatic hypertrophy)   . Essential hypertension 08/04/2014  . Elevated prostate specific antigen (PSA) 2016    eval by Dr Erlene Quan - s/p benign biopsy   . Chronic kidney disease (CKD), stage III (moderate) 10/22/2011  . Arthritis   . Hematuria, microscopic   . Hydrocele, left   . ED (erectile dysfunction)     Surgical History: Past Surgical History  Procedure Laterality Date  . Inguinal hernia repair  07/21/02    Left---Dr. Bary Castilla  . Colonoscopy  07/12/08    Internal hemorrhoids---Dr. Fuller Plan    Home Medications:    Medication List       This list is accurate as of: 07/27/15  3:25 PM.  Always use your most recent med list.               amLODipine 10 MG tablet  Commonly known as:  NORVASC  Take 1 tablet (10 mg total) by mouth daily. Take 1 tablet daily for the frist week.     EPINEPHrine 0.3 mg/0.3 mL Soaj injection  Commonly known as:  EPI-PEN  Inject 0.3 mLs (0.3 mg total) into the muscle once.     Vitamin D3 1000 UNITS Caps  Take 1 capsule (1,000 Units total) by mouth  daily.        Allergies: No Known Allergies  Family History: Family History  Problem Relation Age of Onset  . Hypertension Father   . Prostate cancer Father 19    Radiation seeds  . Macular degeneration Father   . Hyperlipidemia Brother   . Other Brother     BPH  . Diabetes Neg Hx   . Breast cancer Neg Hx   . Colon cancer Neg Hx   . Depression Neg Hx   . Alcohol abuse Neg Hx   . Stroke Neg Hx   . CAD Neg Hx     Social History:  reports that he has never smoked. He has never used smokeless tobacco. He reports that he does not drink alcohol or use illicit drugs.  ROS: UROLOGY Frequent Urination?: No Hard to postpone urination?: No Burning/pain with urination?: No Get up at night to urinate?: No Leakage of urine?: No Urine stream starts and stops?: No Trouble starting stream?: No Do you have to strain to urinate?: No Blood in urine?: No Urinary tract infection?: No Sexually transmitted disease?: No Injury to kidneys or bladder?: No Painful intercourse?: No Weak stream?: No Erection problems?: No Penile pain?: No  Gastrointestinal Nausea?: No Vomiting?: No Indigestion/heartburn?: No Diarrhea?: No Constipation?: No  Constitutional Fever: No Night sweats?: No Fatigue?: No  Skin Skin rash/lesions?: No Itching?: No  Eyes Blurred vision?: No Double vision?: No  Ears/Nose/Throat Sore throat?: No Sinus problems?: No  Hematologic/Lymphatic Swollen glands?: No  Cardiovascular Leg swelling?: No Chest pain?: No  Respiratory Cough?: No Shortness of breath?: No  Endocrine Excessive thirst?: No  Musculoskeletal Back pain?: No Joint pain?: No  Neurological Headaches?: No Dizziness?: No  Psychologic Depression?: No Anxiety?: No  Physical Exam: BP 135/83 mmHg  Pulse 70  Ht 6' (1.829 m)  Wt 183 lb 1.6 oz (83.054 kg)  BMI 24.83 kg/m2  Constitutional:  Alert and oriented, No acute distress. HEENT: [place] AT, moist mucus membranes.  Trachea  midline, no masses. Cardiovascular: No clubbing, cyanosis, or edema. Respiratory: Normal respiratory effort, no increased work of breathing. GI: Abdomen is soft, nontender, nondistended, no abdominal masses GU: No CVA tenderness.  Skin: No rashes, bruises or suspicious lesions. Lymph: No cervical or inguinal adenopathy. Neurologic: Grossly intact, no focal deficits, moving all 4 extremities. Psychiatric: Normal mood and affect.  Laboratory Data: Lab Results  Component Value Date   WBC 7.8 03/03/2015   HGB 14.9 03/03/2015   HCT 44.8 03/03/2015   MCV 90.1 03/03/2015   PLT 250.0 03/03/2015    Lab Results  Component Value Date   CREATININE 1.40 03/03/2015    Lab Results  Component Value Date   PSA 4.4* 11/02/2014   PSA 4.06* 08/01/2014   PSA 3.20 01/13/2012    No results found for: TESTOSTERONE  No results found for: HGBA1C  Urinalysis No results found for: COLORURINE, APPEARANCEUR, LABSPEC, PHURINE, GLUCOSEU, HGBUR, BILIRUBINUR, KETONESUR, PROTEINUR, UROBILINOGEN, NITRITE, LEUKOCYTESUR    Assessment & Plan:    1. Elevated prostate specific antigen (PSA) The patient has a history of an elevated PSA with a negative prostate biopsy 6 months ago. We will recheck his PSA today. Assuming that it is stable, we'll see him back in 6 months for repeat PSA and DRE.   Return in about 6 months (around 01/24/2016) for with PSA one week prior to appt.Nickie Retort, [facility] [address], [place], [place] [address] [phone]

## 2015-07-28 LAB — PSA: PROSTATE SPECIFIC AG, SERUM: 5.8 ng/mL — AB (ref 0.0–4.0)

## 2015-08-03 ENCOUNTER — Ambulatory Visit (INDEPENDENT_AMBULATORY_CARE_PROVIDER_SITE_OTHER): Payer: PPO

## 2015-08-03 ENCOUNTER — Telehealth: Payer: Self-pay | Admitting: Urology

## 2015-08-03 DIAGNOSIS — Z23 Encounter for immunization: Secondary | ICD-10-CM | POA: Diagnosis not present

## 2015-08-03 NOTE — Telephone Encounter (Signed)
Called to discuss his rise in PSA to 5.8 from 4.4 nine months ago.  He has a strong family history of prostate cancer.  Patient not interested in re-biopsy as this time.  He will follow up in 6 months as scheduled for repeat PSA/DRE.

## 2015-08-23 ENCOUNTER — Other Ambulatory Visit: Payer: Self-pay | Admitting: Family Medicine

## 2015-08-28 ENCOUNTER — Other Ambulatory Visit: Payer: 59

## 2015-09-04 ENCOUNTER — Encounter: Payer: 59 | Admitting: Family Medicine

## 2015-09-28 ENCOUNTER — Encounter: Payer: Self-pay | Admitting: Family Medicine

## 2015-09-28 ENCOUNTER — Ambulatory Visit (INDEPENDENT_AMBULATORY_CARE_PROVIDER_SITE_OTHER): Payer: PPO | Admitting: Family Medicine

## 2015-09-28 VITALS — BP 124/82 | HR 68 | Temp 98.1°F | Wt 178.0 lb

## 2015-09-28 DIAGNOSIS — H8110 Benign paroxysmal vertigo, unspecified ear: Secondary | ICD-10-CM | POA: Insufficient documentation

## 2015-09-28 HISTORY — DX: Benign paroxysmal vertigo, unspecified ear: H81.10

## 2015-09-28 MED ORDER — MECLIZINE HCL 25 MG PO TABS: 25.0000 mg | ORAL_TABLET | Freq: Three times a day (TID) | ORAL | Status: DC | PRN

## 2015-09-28 NOTE — Patient Instructions (Signed)
I do think you have peripheral positional vertigo from dislodged debris in inner ear worsened sudden head movements. Treat with home canalith repositioning exercises provided today and meclizine for vertigo as needed (may make you sleepy) Let us know if not improving with this.

## 2015-09-28 NOTE — Assessment & Plan Note (Signed)
Story and exam consistent with benign peripheral cause of vertigo (self limited, resolves over minutes, not constant) despite neg dix hallpike. Discussed with patient - treat with meclizine and modified epley canalith repositioning exercises for home. Update if worsening or persistent. Recheck FLP next labwork - pt has increased aerobic exercise significantly since last year.

## 2015-09-28 NOTE — Progress Notes (Signed)
BP 124/82 mmHg  Pulse 68  Temp(Src) 98.1 F (36.7 C) (Oral)  Wt 178 lb (80.74 kg)   CC: vertigo?  Subjective:    Patient ID: William Shelton, male    DOB: 11-01-1950, 65 y.o.   MRN: [id number]  HPI: William Shelton is a 65 y.o. male presenting on 09/28/2015 for Dizziness   Sunday morning while sleeping on floor (new sleeping mat) had episode of vertigo with "room spinning". This has persisted worse in am's over last 5 days. Associated with nausea/vomiting x2. During the day does well - actually walked 5 miles this morning. Notices main trouble is with rolling over in bed or sitting up fast from supine.   Denies recent fevers/chills, viral URI sxs. Intermittent congestion.  No hearing changes or marked tinnitus.  Not using hearing aide regularly. Has regularly been walking 3 miles daily - up to 8-10 daily.  H/o well controlled hypertension. H/o uncontrolled HLD with LDL 189.   Relevant past medical, surgical, family and social history reviewed and updated as indicated. Interim medical history since our last visit reviewed. Allergies and medications reviewed and updated. Current Outpatient Prescriptions on File Prior to Visit  Medication Sig  . amLODipine (NORVASC) 10 MG tablet TAKE 1 TABLET BY MOUTH DAILY.  Marland Kitchen Cholecalciferol (VITAMIN D3) 1000 UNITS CAPS Take 1 capsule (1,000 Units total) by mouth daily.  Marland Kitchen EPINEPHrine 0.3 mg/0.3 mL IJ SOAJ injection Inject 0.3 mLs (0.3 mg total) into the muscle once.   No current facility-administered medications on file prior to visit.    Review of Systems Per HPI unless specifically indicated in ROS section     Objective:    BP 124/82 mmHg  Pulse 68  Temp(Src) 98.1 F (36.7 C) (Oral)  Wt 178 lb (80.74 kg)  Wt Readings from Last 3 Encounters:  09/28/15 178 lb (80.74 kg)  07/27/15 183 lb 1.6 oz (83.054 kg)  03/03/15 181 lb 6.4 oz (82.283 kg)    Physical Exam  Constitutional: He is oriented to person, place, and time. He  appears well-developed and well-nourished. No distress.  HENT:  Head: Normocephalic and atraumatic.  Mouth/Throat: Oropharynx is clear and moist. No oropharyngeal exudate.  Neck: Carotid bruit is not present. No thyromegaly present.  Cardiovascular: Normal rate, regular rhythm, normal heart sounds and intact distal pulses.   No murmur heard. Pulmonary/Chest: Effort normal and breath sounds normal. No respiratory distress. He has no wheezes. He has no rales.  Lymphadenopathy:    He has no cervical adenopathy.  Neurological: He is alert and oriented to person, place, and time. He has normal strength. No cranial nerve deficit or sensory deficit. He displays a negative Romberg sign. Coordination and gait normal.  CN 2-12 intact Station and gait intact FTN intact EOMI with mild nystagmus to end gaze to right dix hallpike negative bilaterally  Skin: Skin is warm and dry. No rash noted.  Psychiatric: He has a normal mood and affect.  Nursing note and vitals reviewed.  Results for orders placed or performed in visit on 07/27/15  PSA  Result Value Ref Range   Prostate Specific Ag, Serum 5.8 (H) 0.0 - 4.0 ng/mL      Assessment & Plan:   Problem List Items Addressed This Visit    BPV (benign positional vertigo) - Primary    Story and exam consistent with benign peripheral cause of vertigo (self limited, resolves over minutes, not constant) despite neg dix hallpike. Discussed with patient - treat with meclizine and  modified epley canalith repositioning exercises for home. Update if worsening or persistent. Recheck FLP next labwork - pt has increased aerobic exercise significantly since last year.          Follow up plan: Return if symptoms worsen or fail to improve.

## 2015-09-28 NOTE — Progress Notes (Signed)
Pre visit review using our clinic review tool, if applicable. No additional management support is needed unless otherwise documented below in the visit note. 

## 2015-10-06 ENCOUNTER — Other Ambulatory Visit (INDEPENDENT_AMBULATORY_CARE_PROVIDER_SITE_OTHER): Payer: PPO

## 2015-10-06 ENCOUNTER — Other Ambulatory Visit: Payer: Self-pay | Admitting: Family Medicine

## 2015-10-06 DIAGNOSIS — N183 Chronic kidney disease, stage 3 unspecified: Secondary | ICD-10-CM

## 2015-10-06 DIAGNOSIS — Z1159 Encounter for screening for other viral diseases: Secondary | ICD-10-CM

## 2015-10-06 DIAGNOSIS — E785 Hyperlipidemia, unspecified: Secondary | ICD-10-CM | POA: Diagnosis not present

## 2015-10-06 DIAGNOSIS — R972 Elevated prostate specific antigen [PSA]: Secondary | ICD-10-CM

## 2015-10-06 DIAGNOSIS — I10 Essential (primary) hypertension: Secondary | ICD-10-CM | POA: Diagnosis not present

## 2015-10-06 LAB — RENAL FUNCTION PANEL
Albumin: 4.1 g/dL (ref 3.5–5.2)
BUN: 19 mg/dL (ref 6–23)
CALCIUM: 9.4 mg/dL (ref 8.4–10.5)
CO2: 30 meq/L (ref 19–32)
CREATININE: 1.24 mg/dL (ref 0.40–1.50)
Chloride: 104 mEq/L (ref 96–112)
GFR: 62.08 mL/min (ref >60.00)
Glucose, Bld: 87 mg/dL (ref 70–99)
Phosphorus: 3 mg/dL (ref 2.3–4.6)
Potassium: 4.2 mEq/L (ref 3.5–5.1)
Sodium: 141 mEq/L (ref 135–145)

## 2015-10-06 LAB — CBC WITH DIFFERENTIAL/PLATELET
BASOS ABS: 0 10*3/uL (ref 0.0–0.1)
Basophils Relative: 0.6 % (ref 0.0–3.0)
Eosinophils Absolute: 0.6 10*3/uL (ref 0.0–0.7)
Eosinophils Relative: 7.1 % — ABNORMAL HIGH (ref 0.0–5.0)
HCT: 44.4 % (ref 39.0–52.0)
Hemoglobin: 14.4 g/dL (ref 13.0–17.0)
LYMPHS ABS: 2.5 10*3/uL (ref 0.7–4.0)
Lymphocytes Relative: 31 % (ref 12.0–46.0)
MCHC: 32.4 g/dL (ref 30.0–36.0)
MCV: 90.9 fl (ref 78.0–100.0)
MONOS PCT: 9.8 % (ref 3.0–12.0)
Monocytes Absolute: 0.8 10*3/uL (ref 0.1–1.0)
NEUTROS ABS: 4.1 10*3/uL (ref 1.4–7.7)
NEUTROS PCT: 51.5 % (ref 43.0–77.0)
PLATELETS: 227 10*3/uL (ref 150.0–400.0)
RBC: 4.88 Mil/uL (ref 4.22–5.81)
RDW: 13.6 % (ref 11.5–15.5)
WBC: 8 10*3/uL (ref 4.0–10.5)

## 2015-10-06 LAB — LIPID PANEL
CHOL/HDL RATIO: 4
Cholesterol: 185 mg/dL (ref 0–200)
HDL: 47.3 mg/dL (ref >39.00)
LDL CALC: 101 mg/dL — AB (ref 0–99)
NONHDL: 138.11
TRIGLYCERIDES: 188 mg/dL — AB (ref 0.0–149.0)
VLDL: 37.6 mg/dL (ref 0.0–40.0)

## 2015-10-06 LAB — VITAMIN D 25 HYDROXY (VIT D DEFICIENCY, FRACTURES): VITD: 19.21 ng/mL — ABNORMAL LOW (ref 30.00–100.00)

## 2015-10-07 LAB — PSA, TOTAL AND FREE
PSA FREE PCT: 16 % — AB (ref >25)
PSA, Free: 0.77 ng/mL
PSA: 4.95 ng/mL — ABNORMAL HIGH (ref <=4.00)

## 2015-10-07 LAB — HEPATITIS C ANTIBODY: HCV Ab: NEGATIVE

## 2015-10-12 ENCOUNTER — Ambulatory Visit (INDEPENDENT_AMBULATORY_CARE_PROVIDER_SITE_OTHER): Payer: PPO | Admitting: Family Medicine

## 2015-10-12 ENCOUNTER — Encounter: Payer: Self-pay | Admitting: Family Medicine

## 2015-10-12 VITALS — BP 116/78 | HR 72 | Temp 98.2°F | Wt 176.5 lb

## 2015-10-12 DIAGNOSIS — Z23 Encounter for immunization: Secondary | ICD-10-CM

## 2015-10-12 DIAGNOSIS — Z0000 Encounter for general adult medical examination without abnormal findings: Secondary | ICD-10-CM | POA: Insufficient documentation

## 2015-10-12 DIAGNOSIS — N183 Chronic kidney disease, stage 3 unspecified: Secondary | ICD-10-CM

## 2015-10-12 DIAGNOSIS — Z7189 Other specified counseling: Secondary | ICD-10-CM | POA: Insufficient documentation

## 2015-10-12 DIAGNOSIS — E785 Hyperlipidemia, unspecified: Secondary | ICD-10-CM

## 2015-10-12 DIAGNOSIS — I10 Essential (primary) hypertension: Secondary | ICD-10-CM

## 2015-10-12 DIAGNOSIS — R972 Elevated prostate specific antigen [PSA]: Secondary | ICD-10-CM

## 2015-10-12 DIAGNOSIS — H8110 Benign paroxysmal vertigo, unspecified ear: Secondary | ICD-10-CM

## 2015-10-12 NOTE — Assessment & Plan Note (Signed)
Chronic, stable. Continue amlodipine. Improvement noted with increased regular aerobic exercise.

## 2015-10-12 NOTE — Assessment & Plan Note (Addendum)
I have personally reviewed the Medicare Annual Wellness questionnaire and have noted 1. The patient's medical and social history 2. Their use of alcohol, tobacco or illicit drugs 3. Their current medications and supplements 4. The patient's functional ability including ADL's, fall risks, home safety risks and hearing or visual impairment. Cognitive function has been assessed and addressed as indicated.  5. Diet and physical activity 6. Evidence for depression or mood disorders The patients weight, height, BMI have been recorded in the chart. I have made referrals, counseling and provided education to the patient based on review of the above and I have provided the pt with a written personalized care plan for preventive services. Provider list updated.. See scanned questionairre as needed for further documentation. Reviewed preventative protocols and updated unless pt declined.   EKG - NSR rate 70s, slight LAD, normal intervals, ADDENDUM=> nonspecific QRS/ST changes anteriorly compared to EKG 08/2014. Given no chest pain with exertion and healthy lifestyle changes over the past year, will continue to monitor.

## 2015-10-12 NOTE — Assessment & Plan Note (Signed)
Appreciate urology care of patient.

## 2015-10-12 NOTE — Patient Instructions (Addendum)
prevnar today. Bring me copy of advanced directive to update your chart.   Health Maintenance, Male A healthy lifestyle and preventative care can promote health and wellness.  Maintain regular health, dental, and eye exams.  Eat a healthy diet. Foods like vegetables, fruits, whole grains, low-fat dairy products, and lean protein foods contain the nutrients you need and are low in calories. Decrease your intake of foods high in solid fats, added sugars, and salt. Get information about a proper diet from your health care provider, if necessary.  Regular physical exercise is one of the most important things you can do for your health. Most adults should get at least 150 minutes of moderate-intensity exercise (any activity that increases your heart rate and causes you to sweat) each week. In addition, most adults need muscle-strengthening exercises on 2 or more days a week.   Maintain a healthy weight. The body mass index (BMI) is a screening tool to identify possible weight problems. It provides an estimate of body fat based on height and weight. Your health care provider can find your BMI and can help you achieve or maintain a healthy weight. For males 20 years and older:  A BMI below 18.5 is considered underweight.  A BMI of 18.5 to 24.9 is normal.  A BMI of 25 to 29.9 is considered overweight.  A BMI of 30 and above is considered obese.  Maintain normal blood lipids and cholesterol by exercising and minimizing your intake of saturated fat. Eat a balanced diet with plenty of fruits and vegetables. Blood tests for lipids and cholesterol should begin at age 25 and be repeated every 5 years. If your lipid or cholesterol levels are high, you are over age 90, or you are at high risk for heart disease, you may need your cholesterol levels checked more frequently.Ongoing high lipid and cholesterol levels should be treated with medicines if diet and exercise are not working.  If you smoke, find out  from your health care provider how to quit. If you do not use tobacco, do not start.  Lung cancer screening is recommended for adults aged 52-80 years who are at high risk for developing lung cancer because of a history of smoking. A yearly low-dose CT scan of the lungs is recommended for people who have at least a 30-pack-year history of smoking and are current smokers or have quit within the past 15 years. A pack year of smoking is smoking an average of 1 pack of cigarettes a day for 1 year (for example, a 30-pack-year history of smoking could mean smoking 1 pack a day for 30 years or 2 packs a day for 15 years). Yearly screening should continue until the smoker has stopped smoking for at least 15 years. Yearly screening should be stopped for people who develop a health problem that would prevent them from having lung cancer treatment.  If you choose to drink alcohol, do not have more than 2 drinks per day. One drink is considered to be 12 oz (360 mL) of beer, 5 oz (150 mL) of wine, or 1.5 oz (45 mL) of liquor.  Avoid the use of street drugs. Do not share needles with anyone. Ask for help if you need support or instructions about stopping the use of drugs.  High blood pressure causes heart disease and increases the risk of stroke. High blood pressure is more likely to develop in:  People who have blood pressure in the end of the normal range (100-139/85-89 mm Hg).  People who are overweight or obese.  People who are African American.  If you are 41-18 years of age, have your blood pressure checked every 3-5 years. If you are 32 years of age or older, have your blood pressure checked every year. You should have your blood pressure measured twice--once when you are at a hospital or clinic, and once when you are not at a hospital or clinic. Record the average of the two measurements. To check your blood pressure when you are not at a hospital or clinic, you can use:  An automated blood pressure  machine at a pharmacy.  A home blood pressure monitor.  If you are 60-71 years old, ask your health care provider if you should take aspirin to prevent heart disease.  Diabetes screening involves taking a blood sample to check your fasting blood sugar level. This should be done once every 3 years after age 67 if you are at a normal weight and without risk factors for diabetes. Testing should be considered at a younger age or be carried out more frequently if you are overweight and have at least 1 risk factor for diabetes.  Colorectal cancer can be detected and often prevented. Most routine colorectal cancer screening begins at the age of 6 and continues through age 34. However, your health care provider may recommend screening at an earlier age if you have risk factors for colon cancer. On a yearly basis, your health care provider may provide home test kits to check for hidden blood in the stool. A small camera at the end of a tube may be used to directly examine the colon (sigmoidoscopy or colonoscopy) to detect the earliest forms of colorectal cancer. Talk to your health care provider about this at age 51 when routine screening begins. A direct exam of the colon should be repeated every 5-10 years through age 42, unless early forms of precancerous polyps or small growths are found.  People who are at an increased risk for hepatitis B should be screened for this virus. You are considered at high risk for hepatitis B if:  You were born in a country where hepatitis B occurs often. Talk with your health care provider about which countries are considered high risk.  Your parents were born in a high-risk country and you have not received a shot to protect against hepatitis B (hepatitis B vaccine).  You have HIV or AIDS.  You use needles to inject street drugs.  You live with, or have sex with, someone who has hepatitis B.  You are a man who has sex with other men (MSM).  You get hemodialysis  treatment.  You take certain medicines for conditions like cancer, organ transplantation, and autoimmune conditions.  Hepatitis C blood testing is recommended for all people born from [birth year] through 1966 and any individual with known risk factors for hepatitis C.  Healthy men should no longer receive prostate-specific antigen (PSA) blood tests as part of routine cancer screening. Talk to your health care provider about prostate cancer screening.  Testicular cancer screening is not recommended for adolescents or adult males who have no symptoms. Screening includes self-exam, a health care provider exam, and other screening tests. Consult with your health care provider about any symptoms you have or any concerns you have about testicular cancer.  Practice safe sex. Use condoms and avoid high-risk sexual practices to reduce the spread of sexually transmitted infections (STIs).  You should be screened for STIs, including gonorrhea and chlamydia if:  You are sexually active and are younger than 24 years.  You are older than 24 years, and your health care provider tells you that you are at risk for this type of infection.  Your sexual activity has changed since you were last screened, and you are at an increased risk for chlamydia or gonorrhea. Ask your health care provider if you are at risk.  If you are at risk of being infected with HIV, it is recommended that you take a prescription medicine daily to prevent HIV infection. This is called pre-exposure prophylaxis (PrEP). You are considered at risk if:  You are a man who has sex with other men (MSM).  You are a heterosexual man who is sexually active with multiple partners.  You take drugs by injection.  You are sexually active with a partner who has HIV.  Talk with your health care provider about whether you are at high risk of being infected with HIV. If you choose to begin PrEP, you should first be tested for HIV. You should then be tested  every 3 months for as long as you are taking PrEP.  Use sunscreen. Apply sunscreen liberally and repeatedly throughout the day. You should seek shade when your shadow is shorter than you. Protect yourself by wearing long sleeves, pants, a wide-brimmed hat, and sunglasses year round whenever you are outdoors.  Tell your health care provider of new moles or changes in moles, especially if there is a change in shape or color. Also, tell your health care provider if a mole is larger than the size of a pencil eraser.  A one-time screening for abdominal aortic aneurysm (AAA) and surgical repair of large AAAs by ultrasound is recommended for men aged 44-75 years who are current or former smokers.  Stay current with your vaccines (immunizations).   This information is not intended to replace advice given to you by your health care provider. Make sure you discuss any questions you have with your health care provider.   Document Released: 02/29/2008 Document Revised: 09/23/2014 Document Reviewed: 01/28/2011 Elsevier Interactive Patient Education [employer].

## 2015-10-12 NOTE — Progress Notes (Addendum)
BP 116/78 mmHg  Pulse 72  Temp(Src) 98.2 F (36.8 C) (Oral)  Wt 176 lb 8 oz (80.06 kg)   CC: welcome to medicare visit  Subjective:    Patient ID: William Shelton, male    DOB: 10-24-1950, 65 y.o.   MRN: [id number]  HPI: William Shelton is a 65 y.o. male presenting on 10/12/2015 for No chief complaint on file.   Brings BP log and walking log. Up to 11 miles/day. Planning on walking [place] in [place] Vertigo resolved with modified epley's  Hearing screen - aides present Vision screen - with eye doctor [place] - passed Depression - passed  Preventative: COLONOSCOPY Date: 07/12/08 Internal hemorrhoids---Dr. Fuller Plan Prostate - followed by Dr Erlene Quan. h/o low risk velocity.Strong stream.No nocturia. + fmhx in form of father with prostate cancer dx age 22s, controlled with seeds. Biopsy normal 12/2014 Flu shot yearly.  prevnar today Td 2009. zostavax - 07/2014 Advanced directive: has at home. HCPOA would be wife. Asked to bring copy to update chart. Seat belt use discussed Sunscreen use discussed. No changing moles on skin nonsmoker  Married; lives with wife 1 son Retired Hotel manager Activity: walks daily up to 10-11 miles daily Diet: good water, fruits/vegetables daily  Relevant past medical, surgical, family and social history reviewed and updated as indicated. Interim medical history since our last visit reviewed. Allergies and medications reviewed and updated. Current Outpatient Prescriptions on File Prior to Visit  Medication Sig  . amLODipine (NORVASC) 10 MG tablet TAKE 1 TABLET BY MOUTH DAILY.  Marland Kitchen Cholecalciferol (VITAMIN D3) 1000 UNITS CAPS Take 1 capsule (1,000 Units total) by mouth daily.  Marland Kitchen EPINEPHrine 0.3 mg/0.3 mL IJ SOAJ injection Inject 0.3 mLs (0.3 mg total) into the muscle once. (Patient not taking: Reported on 10/12/2015)   No current facility-administered medications on file prior to  visit.    Review of Systems  Constitutional: Negative for fever, chills, activity change, appetite change, fatigue and unexpected weight change.  HENT: Negative for hearing loss.   Eyes: Negative for visual disturbance.  Respiratory: Negative for cough, chest tightness, shortness of breath and wheezing.   Cardiovascular: Negative for chest pain, palpitations and leg swelling.  Gastrointestinal: Negative for nausea, vomiting, abdominal pain, diarrhea, constipation, blood in stool and abdominal distention.  Genitourinary: Negative for hematuria and difficulty urinating.  Musculoskeletal: Negative for myalgias, arthralgias and neck pain.  Skin: Negative for rash.  Neurological: Positive for dizziness (recent vertigto). Negative for seizures, syncope and headaches.  Hematological: Negative for adenopathy. Does not bruise/bleed easily.  Psychiatric/Behavioral: Negative for dysphoric mood. The patient is not nervous/anxious.    Per HPI unless specifically indicated in ROS section     Objective:    BP 116/78 mmHg  Pulse 72  Temp(Src) 98.2 F (36.8 C) (Oral)  Wt 176 lb 8 oz (80.06 kg)  Wt Readings from Last 3 Encounters:  10/12/15 176 lb 8 oz (80.06 kg)  09/28/15 178 lb (80.74 kg)  07/27/15 183 lb 1.6 oz (83.054 kg)    Physical Exam  Constitutional: He is oriented to person, place, and time. He appears well-developed and well-nourished. No distress.  HENT:  Head: Normocephalic and atraumatic.  Right Ear: External ear normal. Decreased hearing is noted.  Left Ear: External ear normal. Decreased hearing is noted.  Nose: Nose normal.  Mouth/Throat: Uvula is midline, oropharynx is clear and moist and mucous membranes are normal. No oropharyngeal exudate, posterior oropharyngeal edema or posterior oropharyngeal erythema.  Hearing aides in  place  Eyes: Conjunctivae and EOM are normal. Pupils are equal, round, and reactive to light. No scleral icterus.  Neck: Normal range of motion. Neck  supple. Carotid bruit is not present. No thyromegaly present.  Cardiovascular: Normal rate, regular rhythm, normal heart sounds and intact distal pulses.   No murmur heard. Pulses:      Radial pulses are 2+ on the right side, and 2+ on the left side.  Pulmonary/Chest: Effort normal and breath sounds normal. No respiratory distress. He has no wheezes. He has no rales.  Abdominal: Soft. Bowel sounds are normal. He exhibits no distension and no mass. There is no tenderness. There is no rebound and no guarding.  Musculoskeletal: Normal range of motion. He exhibits no edema.  Lymphadenopathy:    He has no cervical adenopathy.  Neurological: He is alert and oriented to person, place, and time.  CN grossly intact, station and gait intact Recall 3/3 Calculation 5/5 serial 3s  Skin: Skin is warm and dry. No rash noted.  Psychiatric: He has a normal mood and affect. His behavior is normal. Judgment and thought content normal.  Nursing note and vitals reviewed.  Results for orders placed or performed in visit on 10/06/15  Lipid panel  Result Value Ref Range   Cholesterol 185 0 - 200 mg/dL   Triglycerides 188.0 (H) 0.0 - 149.0 mg/dL   HDL 47.30 >39.00 mg/dL   VLDL 37.6 0.0 - 40.0 mg/dL   LDL Cholesterol 101 (H) 0 - 99 mg/dL   Total CHOL/HDL Ratio 4    NonHDL 138.11   VITAMIN D 25 Hydroxy (Vit-D Deficiency, Fractures)  Result Value Ref Range   VITD 19.21 (L) 30.00 - 100.00 ng/mL  Renal function panel  Result Value Ref Range   Sodium 141 135 - 145 mEq/L   Potassium 4.2 3.5 - 5.1 mEq/L   Chloride 104 96 - 112 mEq/L   CO2 30 19 - 32 mEq/L   Calcium 9.4 8.4 - 10.5 mg/dL   Albumin 4.1 3.5 - 5.2 g/dL   BUN 19 6 - 23 mg/dL   Creatinine, Ser 1.24 0.40 - 1.50 mg/dL   Glucose, Bld 87 70 - 99 mg/dL   Phosphorus 3.0 2.3 - 4.6 mg/dL   GFR 62.08 >60.00 mL/min  CBC with Differential/Platelet  Result Value Ref Range   WBC 8.0 4.0 - 10.5 K/uL   RBC 4.88 4.22 - 5.81 Mil/uL   Hemoglobin 14.4 13.0 -  17.0 g/dL   HCT 44.4 39.0 - 52.0 %   MCV 90.9 78.0 - 100.0 fl   MCHC 32.4 30.0 - 36.0 g/dL   RDW 13.6 11.5 - 15.5 %   Platelets 227.0 150.0 - 400.0 K/uL   Neutrophils Relative % 51.5 43.0 - 77.0 %   Lymphocytes Relative 31.0 12.0 - 46.0 %   Monocytes Relative 9.8 3.0 - 12.0 %   Eosinophils Relative 7.1 (H) 0.0 - 5.0 %   Basophils Relative 0.6 0.0 - 3.0 %   Neutro Abs 4.1 1.4 - 7.7 K/uL   Lymphs Abs 2.5 0.7 - 4.0 K/uL   Monocytes Absolute 0.8 0.1 - 1.0 K/uL   Eosinophils Absolute 0.6 0.0 - 0.7 K/uL   Basophils Absolute 0.0 0.0 - 0.1 K/uL  PSA, total and free  Result Value Ref Range   PSA 4.95 (H) <=4.00 ng/mL   PSA, Free 0.77 ng/mL   PSA, Free Pct 16 (L) >25 %  Hepatitis C antibody  Result Value Ref Range   HCV Ab  NEGATIVE NEGATIVE      Assessment & Plan:   Problem List Items Addressed This Visit    Welcome to Medicare preventive visit - Primary    I have personally reviewed the Medicare Annual Wellness questionnaire and have noted 1. The patient's medical and social history 2. Their use of alcohol, tobacco or illicit drugs 3. Their current medications and supplements 4. The patient's functional ability including ADL's, fall risks, home safety risks and hearing or visual impairment. Cognitive function has been assessed and addressed as indicated.  5. Diet and physical activity 6. Evidence for depression or mood disorders The patients weight, height, BMI have been recorded in the chart. I have made referrals, counseling and provided education to the patient based on review of the above and I have provided the pt with a written personalized care plan for preventive services. Provider list updated.. See scanned questionairre as needed for further documentation. Reviewed preventative protocols and updated unless pt declined.   EKG - NSR rate 70s, slight LAD, normal intervals, ADDENDUM=> nonspecific QRS/ST changes anteriorly compared to EKG 08/2014. Given no chest pain with  exertion and healthy lifestyle changes over the past year, will continue to monitor.      Relevant Orders   EKG 12-Lead (Completed)   HLD (hyperlipidemia)    Marked improvement with aerobic exercise (51mi/day walks). Trig remain mildly elevated - reviewed dietary changes to lower readings.      Health maintenance examination    Preventative protocols reviewed and updated unless pt declined. Discussed healthy diet and lifestyle.       Essential hypertension    Chronic, stable. Continue amlodipine. Improvement noted with increased regular aerobic exercise.      Elevated prostate specific antigen (PSA)    Appreciate urology care of patient.      Chronic kidney disease (CKD), stage III (moderate)    Actually, improved with increased aerobic exercise and better bp control - continue amlodipine and regular walking.       RESOLVED: BPV (benign positional vertigo)    This has resolved with home exercises.      Advanced care planning/counseling discussion    Advanced directive: has at home. HCPOA would be wife. Asked to bring copy to update chart.          Follow up plan: Return in about 1 year (around 10/11/2016), or as needed, for medicare wellness visit.

## 2015-10-12 NOTE — Assessment & Plan Note (Signed)
This has resolved with home exercises.

## 2015-10-12 NOTE — Progress Notes (Signed)
Pre visit review using our clinic review tool, if applicable. No additional management support is needed unless otherwise documented below in the visit note. 

## 2015-10-12 NOTE — Assessment & Plan Note (Signed)
Preventative protocols reviewed and updated unless pt declined. Discussed healthy diet and lifestyle.  

## 2015-10-12 NOTE — Assessment & Plan Note (Signed)
Marked improvement with aerobic exercise (19mi/day walks). Trig remain mildly elevated - reviewed dietary changes to lower readings.

## 2015-10-12 NOTE — Assessment & Plan Note (Signed)
Actually, improved with increased aerobic exercise and better bp control - continue amlodipine and regular walking.

## 2015-10-12 NOTE — Addendum Note (Signed)
Addended by: Royann Shivers A on: 10/12/2015 01:30 PM   Modules accepted: Orders

## 2015-10-12 NOTE — Assessment & Plan Note (Signed)
Advanced directive: has at home. HCPOA would be wife. Asked to bring copy to update chart. 

## 2016-01-19 ENCOUNTER — Other Ambulatory Visit: Payer: PPO

## 2016-01-26 ENCOUNTER — Ambulatory Visit: Payer: PPO | Admitting: Urology

## 2016-02-09 ENCOUNTER — Ambulatory Visit: Payer: PPO | Admitting: Urology

## 2016-03-22 ENCOUNTER — Other Ambulatory Visit: Payer: Self-pay

## 2016-03-22 DIAGNOSIS — R972 Elevated prostate specific antigen [PSA]: Secondary | ICD-10-CM

## 2016-03-25 ENCOUNTER — Other Ambulatory Visit: Payer: PPO

## 2016-03-25 DIAGNOSIS — R972 Elevated prostate specific antigen [PSA]: Secondary | ICD-10-CM | POA: Diagnosis not present

## 2016-03-26 LAB — PSA: Prostate Specific Ag, Serum: 6.7 ng/mL — ABNORMAL HIGH (ref 0.0–4.0)

## 2016-04-01 ENCOUNTER — Telehealth: Payer: Self-pay | Admitting: *Deleted

## 2016-04-01 NOTE — Telephone Encounter (Signed)
Mr. Kempen dropped of a medical clearance form to be signed by Dr. Danise Mina. Please fax the form when completed. Form placed in prescription tower.

## 2016-04-02 NOTE — Telephone Encounter (Signed)
In your IN box for completion.  

## 2016-04-02 NOTE — Telephone Encounter (Signed)
Filled and in Kim's box. 

## 2016-04-03 ENCOUNTER — Ambulatory Visit: Payer: PPO | Admitting: Urology

## 2016-04-03 NOTE — Telephone Encounter (Signed)
Form faxed and patient notified.

## 2016-04-07 ENCOUNTER — Encounter: Payer: Self-pay | Admitting: Urology

## 2016-04-11 NOTE — Telephone Encounter (Signed)
I have been trying to reach this patient to reschd his appt. I have left several messages and we have communicated via email. He has  Not called me back yet.    Thanks,   Sharyn Lull

## 2016-06-21 ENCOUNTER — Encounter: Payer: Self-pay | Admitting: Family Medicine

## 2016-07-15 DIAGNOSIS — D3132 Benign neoplasm of left choroid: Secondary | ICD-10-CM | POA: Diagnosis not present

## 2016-07-15 DIAGNOSIS — H2513 Age-related nuclear cataract, bilateral: Secondary | ICD-10-CM | POA: Diagnosis not present

## 2016-07-15 DIAGNOSIS — D3131 Benign neoplasm of right choroid: Secondary | ICD-10-CM | POA: Diagnosis not present

## 2016-08-16 ENCOUNTER — Other Ambulatory Visit: Payer: Self-pay | Admitting: Family Medicine

## 2016-10-11 ENCOUNTER — Other Ambulatory Visit: Payer: PPO

## 2016-10-11 DIAGNOSIS — R972 Elevated prostate specific antigen [PSA]: Secondary | ICD-10-CM

## 2016-10-12 LAB — PSA: PROSTATE SPECIFIC AG, SERUM: 6.6 ng/mL — AB (ref 0.0–4.0)

## 2016-10-15 ENCOUNTER — Ambulatory Visit: Payer: PPO | Admitting: Urology

## 2016-10-15 ENCOUNTER — Encounter: Payer: Self-pay | Admitting: Urology

## 2016-10-15 VITALS — BP 157/83 | HR 67 | Ht 72.0 in | Wt 182.0 lb

## 2016-10-15 DIAGNOSIS — N183 Chronic kidney disease, stage 3 unspecified: Secondary | ICD-10-CM

## 2016-10-15 DIAGNOSIS — R972 Elevated prostate specific antigen [PSA]: Secondary | ICD-10-CM

## 2016-10-15 NOTE — Progress Notes (Signed)
10/15/2016 5:02 PM   William Shelton 1951/07/12 [id number]  Referring provider: Ria Bush, MD [place], [place] [address]  No chief complaint on file.   HPI: 66 year old gentleman who presents for follow-up of an elevated PSA.  His last PSA was 6.6 in February 2016. He underwent a prostate biopsy at that time was unremarkable.  His PSA trend is as follows: 6.6 on 10/11/16 6.7 on 03/25/16 4.95 on 10/06/15 5.8 on 10/16 4.20 October 2014  --> negative prostate biopsy (unable to pull up records, TRUS volume unknown) 4.22 July 2014 2.04 2014 4.25 2013  He has a significant family history of prostate cancer in his father, grandfather, great-grandfather. His father was diagnosed in his 30s with prostate cancer and died at age 75 of other etiologies.  Today, he denies any urinary issues. No urgency frequency. No UTIs. No gross hematuria.  He does have baseline renal insufficiency, most recent creatinine improved to 1.24 from baseline ~1.5 on 10/06/2015.   PMH: Past Medical History:  Diagnosis Date  . Allergic rhinitis, cause unspecified   . Arthritis   . BPH (benign prostatic hypertrophy)   . BPV (benign positional vertigo) 09/28/2015  . Chronic kidney disease (CKD), stage III (moderate) 10/22/2011  . ED (erectile dysfunction)   . Elevated prostate specific antigen (PSA) 2016   eval by Dr Erlene Quan - s/p benign biopsy   . Essential hypertension 08/04/2014  . Hematuria, microscopic   . HLD (hyperlipidemia)   . Hydrocele, left   . IBS (irritable bowel syndrome)   . Internal hemorrhoid     Surgical History: Past Surgical History:  Procedure Laterality Date  . COLONOSCOPY  07/12/08   Internal hemorrhoids---Dr. Fuller Plan  . INGUINAL HERNIA REPAIR  07/21/02   Left---Dr. Bary Castilla    Home Medications:  Allergies as of 10/15/2016   No Known Allergies     Medication List       Accurate as of 10/15/16  5:02 PM. Always use your most recent med list.            amLODipine 10 MG tablet Commonly known as:  NORVASC TAKE 1 TABLET BY MOUTH DAILY.   EPINEPHrine 0.3 mg/0.3 mL Soaj injection Commonly known as:  EPI-PEN Inject 0.3 mLs (0.3 mg total) into the muscle once.   Vitamin D3 1000 units Caps Take 1 capsule (1,000 Units total) by mouth daily.       Allergies: No Known Allergies  Family History: Family History  Problem Relation Age of Onset  . Hypertension Father   . Prostate cancer Father 63    Radiation seeds  . Macular degeneration Father   . Hyperlipidemia Brother   . Other Brother     BPH  . Diabetes Neg Hx   . Breast cancer Neg Hx   . Colon cancer Neg Hx   . Depression Neg Hx   . Alcohol abuse Neg Hx   . Stroke Neg Hx   . CAD Neg Hx     Social History:  reports that he has never smoked. He has never used smokeless tobacco. He reports that he does not drink alcohol or use drugs.  ROS: UROLOGY Frequent Urination?: No Hard to postpone urination?: No Burning/pain with urination?: No Get up at night to urinate?: No Leakage of urine?: No Urine stream starts and stops?: No Trouble starting stream?: No Do you have to strain to urinate?: No Blood in urine?: No Urinary tract infection?: No Sexually transmitted disease?: No Injury to  kidneys or bladder?: No Painful intercourse?: No Weak stream?: No Erection problems?: No Penile pain?: No  Gastrointestinal Nausea?: No Vomiting?: No Indigestion/heartburn?: No Diarrhea?: No Constipation?: No  Constitutional Fever: No Night sweats?: No Weight loss?: No Fatigue?: No  Skin Skin rash/lesions?: No Itching?: No  Eyes Blurred vision?: No Double vision?: No  Ears/Nose/Throat Sore throat?: No Sinus problems?: No  Hematologic/Lymphatic Swollen glands?: No Easy bruising?: No  Cardiovascular Leg swelling?: No Chest pain?: No  Respiratory Cough?: No Shortness of breath?: No  Endocrine Excessive thirst?: No  Musculoskeletal Back pain?:  No Joint pain?: No  Neurological Headaches?: No Dizziness?: No  Psychologic Depression?: No Anxiety?: No  Physical Exam: BP (!) 157/83   Pulse 67   Ht 6' (1.829 m)   Wt 182 lb (82.6 kg)   BMI 24.68 kg/m   Constitutional:  Alert and oriented, No acute distress. HEENT: [place] AT, moist mucus membranes.  Trachea midline, no masses. Cardiovascular: No clubbing, cyanosis, or edema. Respiratory: Normal respiratory effort, no increased work of breathing. GI: Abdomen is soft, nontender, nondistended, no abdominal masses GU: No CVA tenderness.    Rectal exam: Patient refused today Skin: No rashes, bruises or suspicious lesions. Neurologic: Grossly intact, no focal deficits, moving all 4 extremities. Psychiatric: Normal mood and affect.  Laboratory Data: Lab Results  Component Value Date   WBC 8.0 10/06/2015   HGB 14.4 10/06/2015   HCT 44.4 10/06/2015   MCV 90.9 10/06/2015   PLT 227.0 10/06/2015    Lab Results  Component Value Date   CREATININE 1.24 10/06/2015    Lab Results  Component Value Date   PSA 4.95 (H) 10/06/2015   PSA 4.4 (H) 11/02/2014   PSA 4.06 (H) 08/01/2014    Urinalysis N/a  Assessment & Plan:    1. Elevated prostate specific antigen (PSA)/ Rising PSA PSA essentially stable from 1 year ago although overall trend is upwards. Declined rectal exam today has agreed to do next visit  Natural history of prostate cancer was reviewed today in detail along with PSA testing Options for moving forward with discussed. Given his overall stable PSA, continued surveillance would be reasonable. Alternative options including repeat biopsy versus endorectal MRI were reviewed today in detail. Risks and benefits of each were discussed.  He is very interested in prostate MRI.  Risks/ costs of procedure were reviewed including need for endorectal coil, enema.    He would like to pursue prostate MRI and return to follow up these results. If this is negative, we'll continue  to follow him closely. If there is a suspicious area, we'll likely recommend prostate fusion biopsy.  2. CKD Cr stable   Return in about 2 months (around 12/13/2016) for f/u prostate MRI.  [name], MD  [facility] [address] [place], [place] [address] [phone]

## 2016-10-16 ENCOUNTER — Other Ambulatory Visit: Payer: PPO

## 2016-10-18 ENCOUNTER — Ambulatory Visit: Payer: PPO | Admitting: Urology

## 2016-10-23 ENCOUNTER — Telehealth: Payer: Self-pay | Admitting: Urology

## 2016-10-23 ENCOUNTER — Other Ambulatory Visit: Payer: Self-pay | Admitting: Family Medicine

## 2016-10-23 DIAGNOSIS — N183 Chronic kidney disease, stage 3 unspecified: Secondary | ICD-10-CM

## 2016-10-23 DIAGNOSIS — E559 Vitamin D deficiency, unspecified: Secondary | ICD-10-CM

## 2016-10-23 DIAGNOSIS — E785 Hyperlipidemia, unspecified: Secondary | ICD-10-CM

## 2016-10-23 NOTE — Telephone Encounter (Signed)
Pt called office asking for sedative for his MRI scheduled at [facility] on 11/07/2016. Please Advise.

## 2016-10-24 ENCOUNTER — Other Ambulatory Visit (INDEPENDENT_AMBULATORY_CARE_PROVIDER_SITE_OTHER): Payer: PPO

## 2016-10-24 DIAGNOSIS — E785 Hyperlipidemia, unspecified: Secondary | ICD-10-CM

## 2016-10-24 DIAGNOSIS — N183 Chronic kidney disease, stage 3 unspecified: Secondary | ICD-10-CM

## 2016-10-24 DIAGNOSIS — E559 Vitamin D deficiency, unspecified: Secondary | ICD-10-CM | POA: Diagnosis not present

## 2016-10-24 MED ORDER — DIAZEPAM 10 MG PO TABS: 10.0000 mg | ORAL_TABLET | Freq: Once | ORAL | 0 refills | Status: AC

## 2016-10-24 NOTE — Telephone Encounter (Signed)
Patient reports that he has anxiety and is worried that he will be moving too much for his MRI. As such, he is requested Valium which is reasonable. I informed him that he will need a driver that day and possible side effects.  Prescription left appropriate for him to pick up.  William Espy, MD

## 2016-10-25 LAB — VITAMIN D 25 HYDROXY (VIT D DEFICIENCY, FRACTURES): VITD: 26.59 ng/mL — ABNORMAL LOW (ref 30.00–100.00)

## 2016-10-25 LAB — COMPREHENSIVE METABOLIC PANEL
ALBUMIN: 4.2 g/dL (ref 3.5–5.2)
ALT: 22 U/L (ref 0–53)
AST: 28 U/L (ref 0–37)
Alkaline Phosphatase: 90 U/L (ref 39–117)
BILIRUBIN TOTAL: 0.4 mg/dL (ref 0.2–1.2)
BUN: 30 mg/dL — ABNORMAL HIGH (ref 6–23)
CALCIUM: 9.5 mg/dL (ref 8.4–10.5)
CO2: 30 mEq/L (ref 19–32)
CREATININE: 1.21 mg/dL (ref 0.40–1.50)
Chloride: 105 mEq/L (ref 96–112)
GFR: 63.65 mL/min (ref >60.00)
Glucose, Bld: 94 mg/dL (ref 70–99)
Potassium: 4 mEq/L (ref 3.5–5.1)
Sodium: 141 mEq/L (ref 135–145)
TOTAL PROTEIN: 7 g/dL (ref 6.0–8.3)

## 2016-10-25 LAB — LIPID PANEL
CHOL/HDL RATIO: 4
CHOLESTEROL: 214 mg/dL — AB (ref 0–200)
HDL: 49.5 mg/dL (ref >39.00)
LDL Cholesterol: 126 mg/dL — ABNORMAL HIGH (ref 0–99)
NonHDL: 164.22
TRIGLYCERIDES: 190 mg/dL — AB (ref 0.0–149.0)
VLDL: 38 mg/dL (ref 0.0–40.0)

## 2016-10-25 NOTE — Telephone Encounter (Signed)
Gave script to Angie and she will contact the patient to come and get it.  Sharyn Lull

## 2016-10-31 ENCOUNTER — Ambulatory Visit (INDEPENDENT_AMBULATORY_CARE_PROVIDER_SITE_OTHER): Payer: PPO | Admitting: Family Medicine

## 2016-10-31 ENCOUNTER — Encounter: Payer: Self-pay | Admitting: Family Medicine

## 2016-10-31 VITALS — BP 132/82 | HR 72 | Temp 97.5°F | Ht 73.0 in | Wt 179.5 lb

## 2016-10-31 DIAGNOSIS — R972 Elevated prostate specific antigen [PSA]: Secondary | ICD-10-CM

## 2016-10-31 DIAGNOSIS — Z7189 Other specified counseling: Secondary | ICD-10-CM

## 2016-10-31 DIAGNOSIS — N183 Chronic kidney disease, stage 3 unspecified: Secondary | ICD-10-CM

## 2016-10-31 DIAGNOSIS — Z0000 Encounter for general adult medical examination without abnormal findings: Secondary | ICD-10-CM | POA: Diagnosis not present

## 2016-10-31 DIAGNOSIS — Z23 Encounter for immunization: Secondary | ICD-10-CM

## 2016-10-31 DIAGNOSIS — E785 Hyperlipidemia, unspecified: Secondary | ICD-10-CM

## 2016-10-31 DIAGNOSIS — E559 Vitamin D deficiency, unspecified: Secondary | ICD-10-CM

## 2016-10-31 DIAGNOSIS — I10 Essential (primary) hypertension: Secondary | ICD-10-CM

## 2016-10-31 MED ORDER — AMLODIPINE BESYLATE 10 MG PO TABS: 10.0000 mg | ORAL_TABLET | Freq: Every day | ORAL | 3 refills | Status: DC

## 2016-10-31 MED ORDER — VITAMIN D 50 MCG (2000 UT) PO CAPS: 1.0000 | ORAL_CAPSULE | Freq: Every day | ORAL | Status: AC

## 2016-10-31 NOTE — Assessment & Plan Note (Signed)

## 2016-10-31 NOTE — Patient Instructions (Addendum)
Pneumovax today. Take vitamin D 2000 units daily Increase fatty fish, limit added sugars to help cholesterol levels.  Return as needed or in 1 year for next physical.  Health Maintenance, Male A healthy lifestyle and preventative care can promote health and wellness.  Maintain regular health, dental, and eye exams.  Eat a healthy diet. Foods like vegetables, fruits, whole grains, low-fat dairy products, and lean protein foods contain the nutrients you need and are low in calories. Decrease your intake of foods high in solid fats, added sugars, and salt. Get information about a proper diet from your health care provider, if necessary.  Regular physical exercise is one of the most important things you can do for your health. Most adults should get at least 150 minutes of moderate-intensity exercise (any activity that increases your heart rate and causes you to sweat) each week. In addition, most adults need muscle-strengthening exercises on 2 or more days a week.   Maintain a healthy weight. The body mass index (BMI) is a screening tool to identify possible weight problems. It provides an estimate of body fat based on height and weight. Your health care provider can find your BMI and can help you achieve or maintain a healthy weight. For males 20 years and older:  A BMI below 18.5 is considered underweight.  A BMI of 18.5 to 24.9 is normal.  A BMI of 25 to 29.9 is considered overweight.  A BMI of 30 and above is considered obese.  Maintain normal blood lipids and cholesterol by exercising and minimizing your intake of saturated fat. Eat a balanced diet with plenty of fruits and vegetables. Blood tests for lipids and cholesterol should begin at age 59 and be repeated every 5 years. If your lipid or cholesterol levels are high, you are over age 87, or you are at high risk for heart disease, you may need your cholesterol levels checked more frequently.Ongoing high lipid and cholesterol levels  should be treated with medicines if diet and exercise are not working.  If you smoke, find out from your health care provider how to quit. If you do not use tobacco, do not start.  Lung cancer screening is recommended for adults aged 95-80 years who are at high risk for developing lung cancer because of a history of smoking. A yearly low-dose CT scan of the lungs is recommended for people who have at least a 30-pack-year history of smoking and are current smokers or have quit within the past 15 years. A pack year of smoking is smoking an average of 1 pack of cigarettes a day for 1 year (for example, a 30-pack-year history of smoking could mean smoking 1 pack a day for 30 years or 2 packs a day for 15 years). Yearly screening should continue until the smoker has stopped smoking for at least 15 years. Yearly screening should be stopped for people who develop a health problem that would prevent them from having lung cancer treatment.  If you choose to drink alcohol, do not have more than 2 drinks per day. One drink is considered to be 12 oz (360 mL) of beer, 5 oz (150 mL) of wine, or 1.5 oz (45 mL) of liquor.  Avoid the use of street drugs. Do not share needles with anyone. Ask for help if you need support or instructions about stopping the use of drugs.  High blood pressure causes heart disease and increases the risk of stroke. High blood pressure is more likely to develop in:  People who have blood pressure in the end of the normal range (100-139/85-89 mm Hg).  People who are overweight or obese.  People who are African American.  If you are 99-64 years of age, have your blood pressure checked every 3-5 years. If you are 30 years of age or older, have your blood pressure checked every year. You should have your blood pressure measured twice-once when you are at a hospital or clinic, and once when you are not at a hospital or clinic. Record the average of the two measurements. To check your blood  pressure when you are not at a hospital or clinic, you can use:  An automated blood pressure machine at a pharmacy.  A home blood pressure monitor.  If you are 79-44 years old, ask your health care provider if you should take aspirin to prevent heart disease.  Diabetes screening involves taking a blood sample to check your fasting blood sugar level. This should be done once every 3 years after age 71 if you are at a normal weight and without risk factors for diabetes. Testing should be considered at a younger age or be carried out more frequently if you are overweight and have at least 1 risk factor for diabetes.  Colorectal cancer can be detected and often prevented. Most routine colorectal cancer screening begins at the age of 33 and continues through age 17. However, your health care provider may recommend screening at an earlier age if you have risk factors for colon cancer. On a yearly basis, your health care provider may provide home test kits to check for hidden blood in the stool. A small camera at the end of a tube may be used to directly examine the colon (sigmoidoscopy or colonoscopy) to detect the earliest forms of colorectal cancer. Talk to your health care provider about this at age 54 when routine screening begins. A direct exam of the colon should be repeated every 5-10 years through age 81, unless early forms of precancerous polyps or small growths are found.  People who are at an increased risk for hepatitis B should be screened for this virus. You are considered at high risk for hepatitis B if:  You were born in a country where hepatitis B occurs often. Talk with your health care provider about which countries are considered high risk.  Your parents were born in a high-risk country and you have not received a shot to protect against hepatitis B (hepatitis B vaccine).  You have HIV or AIDS.  You use needles to inject street drugs.  You live with, or have sex with, someone who  has hepatitis B.  You are a man who has sex with other men (MSM).  You get hemodialysis treatment.  You take certain medicines for conditions like cancer, organ transplantation, and autoimmune conditions.  Hepatitis C blood testing is recommended for all people born from [birth year] through 1966 and any individual with known risk factors for hepatitis C.  Healthy men should no longer receive prostate-specific antigen (PSA) blood tests as part of routine cancer screening. Talk to your health care provider about prostate cancer screening.  Testicular cancer screening is not recommended for adolescents or adult males who have no symptoms. Screening includes self-exam, a health care provider exam, and other screening tests. Consult with your health care provider about any symptoms you have or any concerns you have about testicular cancer.  Practice safe sex. Use condoms and avoid high-risk sexual practices to reduce the spread of  sexually transmitted infections (STIs).  You should be screened for STIs, including gonorrhea and chlamydia if:  You are sexually active and are younger than 24 years.  You are older than 24 years, and your health care provider tells you that you are at risk for this type of infection.  Your sexual activity has changed since you were last screened, and you are at an increased risk for chlamydia or gonorrhea. Ask your health care provider if you are at risk.  If you are at risk of being infected with HIV, it is recommended that you take a prescription medicine daily to prevent HIV infection. This is called pre-exposure prophylaxis (PrEP). You are considered at risk if:  You are a man who has sex with other men (MSM).  You are a heterosexual man who is sexually active with multiple partners.  You take drugs by injection.  You are sexually active with a partner who has HIV.  Talk with your health care provider about whether you are at high risk of being infected with  HIV. If you choose to begin PrEP, you should first be tested for HIV. You should then be tested every 3 months for as long as you are taking PrEP.  Use sunscreen. Apply sunscreen liberally and repeatedly throughout the day. You should seek shade when your shadow is shorter than you. Protect yourself by wearing long sleeves, pants, a wide-brimmed hat, and sunglasses year round whenever you are outdoors.  Tell your health care provider of new moles or changes in moles, especially if there is a change in shape or color. Also, tell your health care provider if a mole is larger than the size of a pencil eraser.  A one-time screening for abdominal aortic aneurysm (AAA) and surgical repair of large AAAs by ultrasound is recommended for men aged 45-75 years who are current or former smokers.  Stay current with your vaccines (immunizations). This information is not intended to replace advice given to you by your health care provider. Make sure you discuss any questions you have with your health care provider. Document Released: 02/29/2008 Document Revised: 09/23/2014 Document Reviewed: 06/06/2015 Elsevier Interactive Patient Education  2017 [employer].

## 2016-10-31 NOTE — Progress Notes (Signed)
Pre visit review using our clinic review tool, if applicable. No additional management support is needed unless otherwise documented below in the visit note. 

## 2016-10-31 NOTE — Assessment & Plan Note (Signed)
Advanced directive: has at home. HCPOA would be wife. Asked to bring copy to update chart. 

## 2016-10-31 NOTE — Assessment & Plan Note (Signed)
Preventative protocols reviewed and updated unless pt declined. Discussed healthy diet and lifestyle.  

## 2016-10-31 NOTE — Assessment & Plan Note (Signed)
Chronic, stable. Continue amlodipine daily.

## 2016-10-31 NOTE — Assessment & Plan Note (Signed)
Level improved but still low - will take 2000 IU daily.

## 2016-10-31 NOTE — Assessment & Plan Note (Signed)
Appreciate urology care of patient. Pending MRI.

## 2016-10-31 NOTE — Assessment & Plan Note (Signed)
Chronic, but improved with aerobic exercise and better bp control. Continue to monitor.

## 2016-10-31 NOTE — Assessment & Plan Note (Signed)
Diet controlled - continue this.

## 2016-10-31 NOTE — Progress Notes (Signed)
BP 132/82   Pulse 72   Temp 97.5 F (36.4 C) (Oral)   Ht 6\' 1"  (1.854 m)   Wt 179 lb 8 oz (81.4 kg)   BMI 23.68 kg/m    CC: medicare wellness visit Subjective:    Patient ID: William Shelton, male    DOB: 1951-03-30, 66 y.o.   MRN: [id number]  HPI: William Shelton is a 66 y.o. male presenting on 10/31/2016 for Annual Exam   Brings log of blood pressures - wonderful control! In mask and gloves today - because he doesn't want to get sick  [employer] - walks/hikes 3-11 miles daily.    Hearing screen - wears aides Vision screen - with eye doctor [place] - passed Depression - passed  Preventative: COLONOSCOPY Date: 07/12/08 Internal hemorrhoids---Dr. Fuller Plan  Prostate - elevated PSA followed by Dr Erlene Quan. h/o low risk velocity.Biopsy normal 12/2014. Pending MRI. Strong stream.No nocturia. + fmhx in form of father with prostate cancer dx age 66s, controlled with seeds.  Flu shot yearly  Prevnar 09/2015, pneumovax today Td 2009  zostavax - 07/2014  Advanced directive: has at home. HCPOA would be wife. Asked to bring copy to update chart. Seat belt use discussed Sunscreen use discussed. No changing moles on skin Nonsmoker Alcohol - rare  Married; lives with wife  1 son  Retired Microbiologist  Activity: walks daily 5-10 miles daily, minimum 3 miles.  Diet: good water, fruits/vegetables daily   Relevant past medical, surgical, family and social history reviewed and updated as indicated. Interim medical history since our last visit reviewed. Allergies and medications reviewed and updated. Current Outpatient Prescriptions on File Prior to Visit  Medication Sig  . EPINEPHrine 0.3 mg/0.3 mL IJ SOAJ injection Inject 0.3 mLs (0.3 mg total) into the muscle once.   No current facility-administered medications on file prior to visit.     Review of Systems  Constitutional: Negative for activity  change, appetite change, chills, fatigue, fever and unexpected weight change.  HENT: Negative for hearing loss.   Eyes: Negative for visual disturbance.  Respiratory: Negative for cough, chest tightness, shortness of breath and wheezing.   Cardiovascular: Negative for chest pain, palpitations and leg swelling.  Gastrointestinal: Negative for abdominal distention, abdominal pain, blood in stool, constipation, diarrhea, nausea and vomiting.  Genitourinary: Negative for difficulty urinating and hematuria.  Musculoskeletal: Negative for arthralgias, myalgias and neck pain.  Skin: Negative for rash.  Neurological: Negative for dizziness, seizures, syncope and headaches.  Hematological: Negative for adenopathy. Does not bruise/bleed easily.  Psychiatric/Behavioral: Negative for dysphoric mood. The patient is not nervous/anxious.    Per HPI unless specifically indicated in ROS section     Objective:    BP 132/82   Pulse 72   Temp 97.5 F (36.4 C) (Oral)   Ht 6\' 1"  (1.854 m)   Wt 179 lb 8 oz (81.4 kg)   BMI 23.68 kg/m   Wt Readings from Last 3 Encounters:  10/31/16 179 lb 8 oz (81.4 kg)  10/15/16 182 lb (82.6 kg)  10/12/15 176 lb 8 oz (80.1 kg)    Physical Exam  Constitutional: He is oriented to person, place, and time. He appears well-developed and well-nourished. No distress.  HENT:  Head: Normocephalic and atraumatic.  Right Ear: Hearing, tympanic membrane, external ear and ear canal normal.  Left Ear: Hearing, tympanic membrane, external ear and ear canal normal.  Nose: Nose normal.  Mouth/Throat: Uvula is midline, oropharynx is  clear and moist and mucous membranes are normal. No oropharyngeal exudate, posterior oropharyngeal edema or posterior oropharyngeal erythema.  Eyes: Conjunctivae and EOM are normal. Pupils are equal, round, and reactive to light. No scleral icterus.  Neck: Normal range of motion. Neck supple. Carotid bruit is not present. No thyromegaly present.    Cardiovascular: Normal rate, regular rhythm, normal heart sounds and intact distal pulses.   No murmur heard. Pulses:      Radial pulses are 2+ on the right side, and 2+ on the left side.  Pulmonary/Chest: Effort normal and breath sounds normal. No respiratory distress. He has no wheezes. He has no rales.  Abdominal: Soft. Bowel sounds are normal. He exhibits no distension and no mass. There is no tenderness. There is no rebound and no guarding.  Musculoskeletal: Normal range of motion. He exhibits no edema.  Lymphadenopathy:    He has no cervical adenopathy.  Neurological: He is alert and oriented to person, place, and time.  CN grossly intact, station and gait intact  Skin: Skin is warm and dry. No rash noted.  Psychiatric: He has a normal mood and affect. His behavior is normal. Judgment and thought content normal.  Nursing note and vitals reviewed.  Results for orders placed or performed in visit on 10/24/16  Comprehensive metabolic panel  Result Value Ref Range   Sodium 141 135 - 145 mEq/L   Potassium 4.0 3.5 - 5.1 mEq/L   Chloride 105 96 - 112 mEq/L   CO2 30 19 - 32 mEq/L   Glucose, Bld 94 70 - 99 mg/dL   BUN 30 (H) 6 - 23 mg/dL   Creatinine, Ser 1.21 0.40 - 1.50 mg/dL   Total Bilirubin 0.4 0.2 - 1.2 mg/dL   Alkaline Phosphatase 90 39 - 117 U/L   AST 28 0 - 37 U/L   ALT 22 0 - 53 U/L   Total Protein 7.0 6.0 - 8.3 g/dL   Albumin 4.2 3.5 - 5.2 g/dL   Calcium 9.5 8.4 - 10.5 mg/dL   GFR 63.65 >60.00 mL/min  Lipid panel  Result Value Ref Range   Cholesterol 214 (H) 0 - 200 mg/dL   Triglycerides 190.0 (H) 0.0 - 149.0 mg/dL   HDL 49.50 >39.00 mg/dL   VLDL 38.0 0.0 - 40.0 mg/dL   LDL Cholesterol 126 (H) 0 - 99 mg/dL   Total CHOL/HDL Ratio 4    NonHDL 164.22   VITAMIN D 25 Hydroxy (Vit-D Deficiency, Fractures)  Result Value Ref Range   VITD 26.59 (L) 30.00 - 100.00 ng/mL       Assessment & Plan:   Problem List Items Addressed This Visit    Advanced care  planning/counseling discussion    Advanced directive: has at home. HCPOA would be wife. Asked to bring copy to update chart.      Chronic kidney disease (CKD), stage III (moderate)    Chronic, but improved with aerobic exercise and better bp control. Continue to monitor.      Elevated prostate specific antigen (PSA)    Appreciate urology care of patient. Pending MRI.       Essential hypertension    Chronic, stable. Continue amlodipine daily.       Relevant Medications   amLODipine (NORVASC) 10 MG tablet   Health maintenance examination    Preventative protocols reviewed and updated unless pt declined. Discussed healthy diet and lifestyle.       HLD (hyperlipidemia)    Diet controlled - continue this.  Relevant Medications   amLODipine (NORVASC) 10 MG tablet   Medicare annual wellness visit, initial    I have personally reviewed the Medicare Annual Wellness questionnaire and have noted 1. The patient's medical and social history 2. Their use of alcohol, tobacco or illicit drugs 3. Their current medications and supplements 4. The patient's functional ability including ADL's, fall risks, home safety risks and hearing or visual impairment. Cognitive function has been assessed and addressed as indicated.  5. Diet and physical activity 6. Evidence for depression or mood disorders The patients weight, height, BMI have been recorded in the chart. I have made referrals, counseling and provided education to the patient based on review of the above and I have provided the pt with a written personalized care plan for preventive services. Provider list updated.. See scanned questionairre as needed for further documentation. Reviewed preventative protocols and updated unless pt declined.       Vitamin D deficiency    Level improved but still low - will take 2000 IU daily.           Follow up plan: Return in about 1 year (around 10/31/2017) for annual exam, prior fasting for  blood work, medicare wellness visit.  Ria Bush, MD

## 2016-11-07 ENCOUNTER — Ambulatory Visit (HOSPITAL_COMMUNITY)
Admission: RE | Admit: 2016-11-07 | Discharge: 2016-11-07 | Disposition: A | Discharge status: Home / Self Care | Payer: PPO | Source: Ambulatory Visit | Attending: Urology | Admitting: Urology

## 2016-11-07 ENCOUNTER — Other Ambulatory Visit: Payer: Self-pay | Admitting: Urology

## 2016-11-07 DIAGNOSIS — R972 Elevated prostate specific antigen [PSA]: Secondary | ICD-10-CM | POA: Diagnosis not present

## 2016-11-07 MED ORDER — GADOBENATE DIMEGLUMINE 529 MG/ML IV SOLN
20.0000 mL | Freq: Once | INTRAVENOUS | Status: AC | PRN
  Administered 2016-11-07: 17 mL via INTRAVENOUS

## 2016-11-11 ENCOUNTER — Telehealth: Payer: Self-pay | Admitting: Urology

## 2016-11-11 NOTE — Telephone Encounter (Signed)
Called patient to review prostate MRI results. This is fairly unremarkable.  These were reviewed, all questions answered.  I like him to follow up in 3 months with a PSA just prior to his visit. He is agreeable with this plan.  [name], MD

## 2016-11-14 NOTE — Telephone Encounter (Signed)
done

## 2016-11-25 DIAGNOSIS — H903 Sensorineural hearing loss, bilateral: Secondary | ICD-10-CM | POA: Diagnosis not present

## 2016-12-11 ENCOUNTER — Ambulatory Visit: Payer: PPO | Admitting: Urology

## 2016-12-17 ENCOUNTER — Encounter: Payer: Self-pay | Admitting: Family Medicine

## 2016-12-17 MED ORDER — OSELTAMIVIR PHOSPHATE 75 MG PO CAPS: 75.0000 mg | ORAL_CAPSULE | Freq: Every day | ORAL | 0 refills | Status: DC

## 2017-02-13 ENCOUNTER — Other Ambulatory Visit: Payer: Self-pay

## 2017-02-13 DIAGNOSIS — R972 Elevated prostate specific antigen [PSA]: Secondary | ICD-10-CM

## 2017-02-17 ENCOUNTER — Other Ambulatory Visit: Payer: PPO

## 2017-02-17 DIAGNOSIS — R972 Elevated prostate specific antigen [PSA]: Secondary | ICD-10-CM | POA: Diagnosis not present

## 2017-02-18 LAB — PSA: PROSTATE SPECIFIC AG, SERUM: 6 ng/mL — AB (ref 0.0–4.0)

## 2017-02-19 ENCOUNTER — Ambulatory Visit: Payer: PPO | Admitting: Urology

## 2017-02-19 ENCOUNTER — Encounter: Payer: Self-pay | Admitting: Urology

## 2017-02-19 VITALS — BP 152/95 | HR 88 | Ht 72.0 in | Wt 173.0 lb

## 2017-02-19 DIAGNOSIS — N183 Chronic kidney disease, stage 3 unspecified: Secondary | ICD-10-CM

## 2017-02-19 DIAGNOSIS — R972 Elevated prostate specific antigen [PSA]: Secondary | ICD-10-CM

## 2017-02-19 NOTE — Progress Notes (Signed)
02/19/2017 8:49 AM   William Shelton 08-17-1951 [id number]  Referring provider: Ria Bush, MD [address] [place], [place] [address]  Chief Complaint  Patient presents with  . Elevated PSA    21month    HPI: 66 year old gentleman who presents for follow-up of an elevated PSA.  PSA trend and previous workup including prostate biopsy and MRI as below.  Patient reports today that [place] just [place] Overall, she's feeling well. Minimal voiding symptoms.  His PSA trend is as follows: 6.0 on 02/17/17   6.6 on 10/11/16  --> MRI 10/2016 negative, no high grade lesions 6.7 on 03/25/16 4.95 on 10/06/15 5.8 on 10/16 4.20 October 2014  --> negative prostate biopsy (unable to pull up records, TRUS volume unknown) 4.22 July 2014 2.04 2014 4.25 2013  He has a significant family history of prostate cancer in his father, grandfather, great-grandfather. His father was diagnosed in his 68s with prostate cancer and died at age 23 of other etiologies.  He does have baseline renal insufficiency, most recent creatinine improved to 1.21 from baseline ~1.5 on 10/06/2015.   PMH: Past Medical History:  Diagnosis Date  . Allergic rhinitis, cause unspecified   . Arthritis   . BPH (benign prostatic hypertrophy)   . BPV (benign positional vertigo) 09/28/2015  . Chronic kidney disease (CKD), stage III (moderate) 10/22/2011  . ED (erectile dysfunction)   . Elevated prostate specific antigen (PSA) 2016   eval by Dr Erlene Quan - s/p benign biopsy   . Essential hypertension 08/04/2014  . Hematuria, microscopic   . HLD (hyperlipidemia)   . Hydrocele, left   . IBS (irritable bowel syndrome)   . Internal hemorrhoid     Surgical History: Past Surgical History:  Procedure Laterality Date  . COLONOSCOPY  07/12/08   Internal hemorrhoids---Dr. Fuller Plan  . INGUINAL HERNIA REPAIR  07/21/02   Left---Dr. Bary Castilla    Home Medications:  Allergies as of  02/19/2017   No Known Allergies     Medication List       Accurate as of 02/19/17  8:49 AM. Always use your most recent med list.          amLODipine 10 MG tablet Commonly known as:  NORVASC Take 1 tablet (10 mg total) by mouth daily.   EPINEPHrine 0.3 mg/0.3 mL Soaj injection Commonly known as:  EPI-PEN Inject 0.3 mLs (0.3 mg total) into the muscle once.   Vitamin D 2000 units Caps Take 1 capsule (2,000 Units total) by mouth daily.       Allergies: No Known Allergies  Family History: Family History  Problem Relation Age of Onset  . Hypertension Father   . Prostate cancer Father 80       Radiation seeds  . Macular degeneration Father   . Hyperlipidemia Brother   . Other Brother        BPH  . Diabetes Neg Hx   . Breast cancer Neg Hx   . Colon cancer Neg Hx   . Depression Neg Hx   . Alcohol abuse Neg Hx   . Stroke Neg Hx   . CAD Neg Hx     Social History:  reports that he has never smoked. He has never used smokeless tobacco. He reports that he does not drink alcohol or use drugs.  ROS: UROLOGY Frequent Urination?: No Hard to postpone urination?: No Burning/pain with urination?: No Get up at night to urinate?: No Leakage  of urine?: No Urine stream starts and stops?: No Trouble starting stream?: No Do you have to strain to urinate?: No Blood in urine?: No Urinary tract infection?: No Sexually transmitted disease?: No Injury to kidneys or bladder?: No Painful intercourse?: No Weak stream?: No Erection problems?: No Penile pain?: No  Gastrointestinal Nausea?: No Vomiting?: No Indigestion/heartburn?: No Diarrhea?: No Constipation?: No  Constitutional Fever: No Night sweats?: No Weight loss?: No Fatigue?: No  Skin Skin rash/lesions?: No Itching?: No  Eyes Blurred vision?: No Double vision?: No  Ears/Nose/Throat Sore throat?: No Sinus problems?: No  Hematologic/Lymphatic Swollen glands?: No Easy bruising?: No  Cardiovascular Leg  swelling?: No Chest pain?: No  Respiratory Cough?: No Shortness of breath?: No  Endocrine Excessive thirst?: No  Musculoskeletal Back pain?: No Joint pain?: No  Neurological Headaches?: No Dizziness?: No  Psychologic Depression?: No Anxiety?: No  Physical Exam: BP (!) 152/95   Pulse 88   Ht 6' (1.829 m)   Wt 173 lb (78.5 kg)   BMI 23.46 kg/m   Constitutional:  Alert and oriented, No acute distress. HEENT: [place] AT, moist mucus membranes.  Trachea midline, no masses. Cardiovascular: No clubbing, cyanosis, or edema. Respiratory: Normal respiratory effort, no increased work of breathing. GI: Abdomen is soft, nontender, nondistended, no abdominal masses GU: No CVA tenderness.    Rectal exam: 30 cc prostate, questionable induration on the right mid lobe but no discrete nodules. Nontender. Normal sphincter tone. External hemorrhoids appreciated. Skin: No rashes, bruises or suspicious lesions. Neurologic: Grossly intact, no focal deficits, moving all 4 extremities. Psychiatric: Normal mood and affect.  Laboratory Data: Lab Results  Component Value Date   WBC 8.0 10/06/2015   HGB 14.4 10/06/2015   HCT 44.4 10/06/2015   MCV 90.9 10/06/2015   PLT 227.0 10/06/2015    Lab Results  Component Value Date   CREATININE 1.21 10/24/2016   PSA trend as above   Urinalysis N/a  Assessment & Plan:    1. Elevated prostate specific antigen (PSA)/ Rising PSA PSA essentially stable but overall trend upward Previous workup including negative biopsy in 2016 and negative MRI in 2018 We'll continue to follow conservatively at this time, PSA and rectal exam in 6 months He is agreeable with this plan  2. CKD Cr stable   Return in about 6 months (around 08/21/2017) for PSA just prior to visit.  [name], MD  [facility] [address] [place], [place], [place] [address] [phone]

## 2017-05-13 ENCOUNTER — Telehealth: Payer: Self-pay | Admitting: Family Medicine

## 2017-05-13 NOTE — Telephone Encounter (Signed)
Pt dropped off form to be signed. Placed in RX tower. °

## 2017-05-15 NOTE — Telephone Encounter (Signed)
Form signed and in CMA box.

## 2017-05-15 NOTE — Telephone Encounter (Signed)
Spoke to pt and advised forms were available; faxed as requested

## 2017-08-20 ENCOUNTER — Telehealth: Payer: Self-pay | Admitting: Urology

## 2017-08-20 ENCOUNTER — Other Ambulatory Visit: Payer: PPO

## 2017-08-20 DIAGNOSIS — R972 Elevated prostate specific antigen [PSA]: Secondary | ICD-10-CM | POA: Diagnosis not present

## 2017-08-20 NOTE — Telephone Encounter (Signed)
Pt comes in the office today for PSA lab work prior to his appt next week.  Pt asks that his results to be posted on his MyChart portal before his appointment next week. Pt  states he doesn't want to wait until his appointment to know what his results are. Thanks.

## 2017-08-21 LAB — PSA: PROSTATE SPECIFIC AG, SERUM: 4.7 ng/mL — AB (ref 0.0–4.0)

## 2017-08-22 ENCOUNTER — Ambulatory Visit: Payer: PPO | Admitting: Urology

## 2017-08-22 ENCOUNTER — Encounter: Payer: Self-pay | Admitting: Urology

## 2017-08-22 ENCOUNTER — Ambulatory Visit (INDEPENDENT_AMBULATORY_CARE_PROVIDER_SITE_OTHER): Payer: PPO | Admitting: Urology

## 2017-08-22 VITALS — BP 147/88 | HR 64 | Ht 72.0 in | Wt 181.6 lb

## 2017-08-22 DIAGNOSIS — R972 Elevated prostate specific antigen [PSA]: Secondary | ICD-10-CM

## 2017-08-22 DIAGNOSIS — N183 Chronic kidney disease, stage 3 unspecified: Secondary | ICD-10-CM

## 2017-08-22 NOTE — Progress Notes (Signed)
08/22/2017 9:44 AM   William Shelton Aug 29, 1951 [id number]  Referring provider: Ria Bush, MD [address] [place], [place] [address]  Chief Complaint  Patient presents with  . Elevated PSA    HPI: 66 year old male with a history of elevated PSA who returns today for routine follow-up.  PSA trend as below.  He is doing well today, no urinary complaints.     His PSA trend is as follows: 4.7  On 08/20/2017 6.0 on 02/17/17   6.6 on 10/11/16  --> MRI 10/2016 negative, no high grade lesions 6.7 on 03/25/16 4.95 on 10/06/15 5.8 on 10/16 4.20 October 2014  --> negative prostate biopsy (unable to pull up records, TRUS volume unknown) 4.22 July 2014 2.04 2014 4.25 2013  He has a significant family history of prostate cancer in his father, grandfather, great-grandfather. His father was diagnosed in his 73s with prostate cancer and died at age 6 of other etiologies.  He does have baseline renal insufficiency, most recent creatinine improved to 1.21 from baseline ~1.5 on 10/06/2015.   PMH: Past Medical History:  Diagnosis Date  . Allergic rhinitis, cause unspecified   . Arthritis   . BPH (benign prostatic hypertrophy)   . BPV (benign positional vertigo) 09/28/2015  . Chronic kidney disease (CKD), stage III (moderate) ([place]) 10/22/2011  . ED (erectile dysfunction)   . Elevated prostate specific antigen (PSA) 2016   eval by Dr Erlene Quan - s/p benign biopsy   . Essential hypertension 08/04/2014  . Hematuria, microscopic   . HLD (hyperlipidemia)   . Hydrocele, left   . IBS (irritable bowel syndrome)   . Internal hemorrhoid     Surgical History: Past Surgical History:  Procedure Laterality Date  . COLONOSCOPY  07/12/08   Internal hemorrhoids---Dr. Fuller Plan  . INGUINAL HERNIA REPAIR  07/21/02   Left---Dr. Bary Castilla    Home Medications:  Allergies as of 08/22/2017   No Known Allergies     Medication List        Accurate as of 08/22/17  9:44 AM. Always use your  most recent med list.          amLODipine 10 MG tablet Commonly known as:  NORVASC Take 1 tablet (10 mg total) by mouth daily.   EPINEPHrine 0.3 mg/0.3 mL Soaj injection Commonly known as:  EPI-PEN Inject 0.3 mLs (0.3 mg total) into the muscle once.   multivitamin capsule Take 1 capsule by mouth daily.   Vitamin D 2000 units Caps Take 1 capsule (2,000 Units total) by mouth daily.       Allergies: No Known Allergies  Family History: Family History  Problem Relation Age of Onset  . Hypertension Father   . Prostate cancer Father 31       Radiation seeds  . Macular degeneration Father   . Hyperlipidemia Brother   . Other Brother        BPH  . Diabetes Neg Hx   . Breast cancer Neg Hx   . Colon cancer Neg Hx   . Depression Neg Hx   . Alcohol abuse Neg Hx   . Stroke Neg Hx   . CAD Neg Hx     Social History:  reports that  has never smoked. he has never used smokeless tobacco. He reports that he does not drink alcohol or use drugs.  ROS: UROLOGY Frequent Urination?: No Hard to postpone urination?: No Burning/pain with urination?: No Get up at night to urinate?: No Leakage of urine?: No Urine  stream starts and stops?: No Trouble starting stream?: No Do you have to strain to urinate?: No Blood in urine?: No Urinary tract infection?: No Sexually transmitted disease?: No Injury to kidneys or bladder?: No Painful intercourse?: No Weak stream?: No Erection problems?: No Penile pain?: No  Gastrointestinal Nausea?: No Vomiting?: No Indigestion/heartburn?: No Diarrhea?: No Constipation?: No  Constitutional Fever: No Night sweats?: No Weight loss?: No Fatigue?: No  Skin Skin rash/lesions?: No Itching?: No  Eyes Blurred vision?: No Double vision?: No  Ears/Nose/Throat Sore throat?: No Sinus problems?: No  Hematologic/Lymphatic Swollen glands?: No Easy bruising?: No  Cardiovascular Leg swelling?: No Chest pain?: No  Respiratory Cough?:  No Shortness of breath?: No  Endocrine Excessive thirst?: No  Musculoskeletal Back pain?: No Joint pain?: No  Neurological Headaches?: No Dizziness?: No  Psychologic Depression?: No Anxiety?: No  Physical Exam: BP (!) 147/88   Pulse 64   Ht 6' (1.829 m)   Wt 181 lb 9.6 oz (82.4 kg)   BMI 24.63 kg/m   Constitutional:  Alert and oriented, No acute distress. HEENT: [place] AT, moist mucus membranes.  Trachea midline, no masses. Cardiovascular: No clubbing, cyanosis, or edema. Respiratory: Normal respiratory effort, no increased work of breathing. GI: Abdomen is soft, nontender, nondistended, no abdominal masses GU: No CVA tenderness.    Rectal exam: 30 cc prostate, questionable induration on the right mid lobe but no discrete nodules. Nontender. Normal sphincter tone. External hemorrhoids appreciated. Unchanged and stable from last exam.   Skin: No rashes, bruises or suspicious lesions. Neurologic: Grossly intact, no focal deficits, moving all 4 extremities. Psychiatric: Normal mood and affect.  Laboratory Data: Lab Results  Component Value Date   WBC 8.0 10/06/2015   HGB 14.4 10/06/2015   HCT 44.4 10/06/2015   MCV 90.9 10/06/2015   PLT 227.0 10/06/2015    Lab Results  Component Value Date   CREATININE 1.21 10/24/2016   PSA trend as above   Urinalysis N/a  Assessment & Plan:    1. Elevated prostate specific antigen (PSA)/ Rising PSA PSA now trending down which is reassuring Previous workup including negative biopsy in 2016 and negative MRI in 2018 Continue to follow at this time- repeat PSA/ DRE in 12 months given downward trending PSA He is agreeable with this plan  2. CKD Cr stable   Return in about 1 year (around 08/22/2018) for PSA/ DRE.  [name], MD  [facility] [address] [place], [place], [place] [address] [phone]

## 2017-08-26 ENCOUNTER — Ambulatory Visit: Payer: PPO | Admitting: Urology

## 2017-10-20 DIAGNOSIS — D3131 Benign neoplasm of right choroid: Secondary | ICD-10-CM | POA: Diagnosis not present

## 2017-10-20 DIAGNOSIS — H5203 Hypermetropia, bilateral: Secondary | ICD-10-CM | POA: Diagnosis not present

## 2017-10-20 DIAGNOSIS — H524 Presbyopia: Secondary | ICD-10-CM | POA: Diagnosis not present

## 2017-10-20 DIAGNOSIS — D3132 Benign neoplasm of left choroid: Secondary | ICD-10-CM | POA: Diagnosis not present

## 2017-10-28 ENCOUNTER — Other Ambulatory Visit: Payer: Self-pay | Admitting: Family Medicine

## 2017-10-28 DIAGNOSIS — N183 Chronic kidney disease, stage 3 unspecified: Secondary | ICD-10-CM

## 2017-10-28 DIAGNOSIS — E785 Hyperlipidemia, unspecified: Secondary | ICD-10-CM

## 2017-10-28 DIAGNOSIS — E559 Vitamin D deficiency, unspecified: Secondary | ICD-10-CM

## 2017-10-28 DIAGNOSIS — R972 Elevated prostate specific antigen [PSA]: Secondary | ICD-10-CM

## 2017-10-29 ENCOUNTER — Other Ambulatory Visit (INDEPENDENT_AMBULATORY_CARE_PROVIDER_SITE_OTHER): Payer: PPO

## 2017-10-29 DIAGNOSIS — E785 Hyperlipidemia, unspecified: Secondary | ICD-10-CM | POA: Diagnosis not present

## 2017-10-29 DIAGNOSIS — E559 Vitamin D deficiency, unspecified: Secondary | ICD-10-CM | POA: Diagnosis not present

## 2017-10-29 LAB — COMPREHENSIVE METABOLIC PANEL
ALT: 20 U/L (ref 0–53)
AST: 22 U/L (ref 0–37)
Albumin: 4 g/dL (ref 3.5–5.2)
Alkaline Phosphatase: 84 U/L (ref 39–117)
BUN: 25 mg/dL — ABNORMAL HIGH (ref 6–23)
CHLORIDE: 106 meq/L (ref 96–112)
CO2: 29 meq/L (ref 19–32)
CREATININE: 1.22 mg/dL (ref 0.40–1.50)
Calcium: 9 mg/dL (ref 8.4–10.5)
GFR: 62.86 mL/min (ref >60.00)
GLUCOSE: 98 mg/dL (ref 70–99)
Potassium: 4.5 mEq/L (ref 3.5–5.1)
SODIUM: 141 meq/L (ref 135–145)
Total Bilirubin: 0.5 mg/dL (ref 0.2–1.2)
Total Protein: 7 g/dL (ref 6.0–8.3)

## 2017-10-29 LAB — LIPID PANEL
CHOL/HDL RATIO: 4
Cholesterol: 200 mg/dL (ref 0–200)
HDL: 51.7 mg/dL (ref >39.00)
LDL Cholesterol: 132 mg/dL — ABNORMAL HIGH (ref 0–99)
NONHDL: 148.28
TRIGLYCERIDES: 83 mg/dL (ref 0.0–149.0)
VLDL: 16.6 mg/dL (ref 0.0–40.0)

## 2017-10-29 LAB — VITAMIN D 25 HYDROXY (VIT D DEFICIENCY, FRACTURES): VITD: 44.2 ng/mL (ref 30.00–100.00)

## 2017-11-05 ENCOUNTER — Other Ambulatory Visit: Payer: Self-pay | Admitting: Family Medicine

## 2017-11-05 ENCOUNTER — Encounter: Payer: Self-pay | Admitting: Family Medicine

## 2017-11-05 ENCOUNTER — Ambulatory Visit (INDEPENDENT_AMBULATORY_CARE_PROVIDER_SITE_OTHER): Payer: PPO | Admitting: Family Medicine

## 2017-11-05 VITALS — BP 118/82 | HR 65 | Temp 97.9°F | Ht 72.0 in | Wt 174.2 lb

## 2017-11-05 DIAGNOSIS — M79672 Pain in left foot: Secondary | ICD-10-CM | POA: Insufficient documentation

## 2017-11-05 DIAGNOSIS — R0683 Snoring: Secondary | ICD-10-CM | POA: Insufficient documentation

## 2017-11-05 DIAGNOSIS — E559 Vitamin D deficiency, unspecified: Secondary | ICD-10-CM

## 2017-11-05 DIAGNOSIS — E785 Hyperlipidemia, unspecified: Secondary | ICD-10-CM

## 2017-11-05 DIAGNOSIS — R972 Elevated prostate specific antigen [PSA]: Secondary | ICD-10-CM

## 2017-11-05 DIAGNOSIS — Z0000 Encounter for general adult medical examination without abnormal findings: Secondary | ICD-10-CM

## 2017-11-05 DIAGNOSIS — N183 Chronic kidney disease, stage 3 unspecified: Secondary | ICD-10-CM

## 2017-11-05 DIAGNOSIS — Z7189 Other specified counseling: Secondary | ICD-10-CM

## 2017-11-05 DIAGNOSIS — Z0001 Encounter for general adult medical examination with abnormal findings: Secondary | ICD-10-CM

## 2017-11-05 DIAGNOSIS — I10 Essential (primary) hypertension: Secondary | ICD-10-CM

## 2017-11-05 NOTE — Assessment & Plan Note (Signed)
Chronic, improved. Continue to monitor.

## 2017-11-05 NOTE — Assessment & Plan Note (Signed)
Chronic, stable. Continue current regimen. 

## 2017-11-05 NOTE — Assessment & Plan Note (Signed)
Appreciate uro care 

## 2017-11-05 NOTE — Assessment & Plan Note (Signed)

## 2017-11-05 NOTE — Patient Instructions (Addendum)
Look out for colonoscopy recall letter this fall - let us know if you don't receive. If interested, check with pharmacy about new 2 shot shingles series (shingrix).  Bring [place] copy of your advanced directives to update your chart.  You are doing well today - continue healthy lifestyle and blood pressure medicine and healthy diet.  Return as needed or in 1 year for next medicare wellness/physical.   Health Maintenance, Male A healthy lifestyle and preventive care is important for your health and wellness. Ask your health care provider about what schedule of regular examinations is right for you. What should I know about weight and diet? Eat a Healthy Diet  Eat plenty of vegetables, fruits, whole grains, low-fat dairy products, and lean protein.  Do not eat a lot of foods high in solid fats, added sugars, or salt.  Maintain a Healthy Weight Regular exercise can help you achieve or maintain a healthy weight. You should:  Do at least 150 minutes of exercise each week. The exercise should increase your heart rate and make you sweat (moderate-intensity exercise).  Do strength-training exercises at least twice a week.  Watch Your Levels of Cholesterol and Blood Lipids  Have your blood tested for lipids and cholesterol every 5 years starting at 68 years of age. If you are at high risk for heart disease, you should start having your blood tested when you are 68 years old. You may need to have your cholesterol levels checked more often if: ? Your lipid or cholesterol levels are high. ? You are older than 68 years of age. ? You are at high risk for heart disease.  What should I know about cancer screening? Many types of cancers can be detected early and may often be prevented. Lung Cancer  You should be screened every year for lung cancer if: ? You are a current smoker who has smoked for at least 30 years. ? You are a former smoker who has quit within the past 15 years.  Talk to your health  care provider about your screening options, when you should start screening, and how often you should be screened.  Colorectal Cancer  Routine colorectal cancer screening usually begins at 68 years of age and should be repeated every 5-10 years until you are 68 years old. You may need to be screened more often if early forms of precancerous polyps or small growths are found. Your health care provider may recommend screening at an earlier age if you have risk factors for colon cancer.  Your health care provider may recommend using home test kits to check for hidden blood in the stool.  A small camera at the end of a tube can be used to examine your colon (sigmoidoscopy or colonoscopy). This checks for the earliest forms of colorectal cancer.  Prostate and Testicular Cancer  Depending on your age and overall health, your health care provider may do certain tests to screen for prostate and testicular cancer.  Talk to your health care provider about any symptoms or concerns you have about testicular or prostate cancer.  Skin Cancer  Check your skin from head to toe regularly.  Tell your health care provider about any new moles or changes in moles, especially if: ? There is a change in a mole's size, shape, or color. ? You have a mole that is larger than a pencil eraser.  Always use sunscreen. Apply sunscreen liberally and repeat throughout the day.  Protect yourself by wearing long sleeves, pants,  a wide-brimmed hat, and sunglasses when outside.  What should I know about heart disease, diabetes, and high blood pressure?  If you are 25-43 years of age, have your blood pressure checked every 3-5 years. If you are 35 years of age or older, have your blood pressure checked every year. You should have your blood pressure measured twice-once when you are at a hospital or clinic, and once when you are not at a hospital or clinic. Record the average of the two measurements. To check your blood  pressure when you are not at a hospital or clinic, you can use: ? An automated blood pressure machine at a pharmacy. ? A home blood pressure monitor.  Talk to your health care provider about your target blood pressure.  If you are between 59-39 years old, ask your health care provider if you should take aspirin to prevent heart disease.  Have regular diabetes screenings by checking your fasting blood sugar level. ? If you are at a normal weight and have a low risk for diabetes, have this test once every three years after the age of 80. ? If you are overweight and have a high risk for diabetes, consider being tested at a younger age or more often.  A one-time screening for abdominal aortic aneurysm (AAA) by ultrasound is recommended for men aged 33-75 years who are current or former smokers. What should I know about preventing infection? Hepatitis B If you have a higher risk for hepatitis B, you should be screened for this virus. Talk with your health care provider to find out if you are at risk for hepatitis B infection. Hepatitis C Blood testing is recommended for:  Everyone born from [birth year] through 1966.  Anyone with known risk factors for hepatitis C.  Sexually Transmitted Diseases (STDs)  You should be screened each year for STDs including gonorrhea and chlamydia if: ? You are sexually active and are younger than 68 years of age. ? You are older than 68 years of age and your health care provider tells you that you are at risk for this type of infection. ? Your sexual activity has changed since you were last screened and you are at an increased risk for chlamydia or gonorrhea. Ask your health care provider if you are at risk.  Talk with your health care provider about whether you are at high risk of being infected with HIV. Your health care provider may recommend a prescription medicine to help prevent HIV infection.  What else can I do?  Schedule regular health, dental, and eye  exams.  Stay current with your vaccines (immunizations).  Do not use any tobacco products, such as cigarettes, chewing tobacco, and e-cigarettes. If you need help quitting, ask your health care provider.  Limit alcohol intake to no more than 2 drinks per day. One drink equals 12 ounces of beer, 5 ounces of wine, or 1 ounces of hard liquor.  Do not use street drugs.  Do not share needles.  Ask your health care provider for help if you need support or information about quitting drugs.  Tell your health care provider if you often feel depressed.  Tell your health care provider if you have ever been abused or do not feel safe at home. This information is not intended to replace advice given to you by your health care provider. Make sure you discuss any questions you have with your health care provider. Document Released: 02/29/2008 Document Revised: 05/01/2016 Document Reviewed: 06/06/2015 Elsevier Interactive  Patient Education  [employer].

## 2017-11-05 NOTE — Assessment & Plan Note (Signed)
repleting well. Continue.

## 2017-11-05 NOTE — Progress Notes (Signed)
BP 118/82 (BP Location: Left Arm, Patient Position: Sitting, Cuff Size: Normal)   Pulse 65   Temp 97.9 F (36.6 C) (Oral)   Ht 6' (1.829 m)   Wt 174 lb 4 oz (79 kg)   SpO2 98%   BMI 23.63 kg/m   Hearing Screening Comments: Wears hearing aids. Has appt, 11/2017 Vision Screening Comments: Last eye exam, 2 wks ago  CC: AMW Subjective:    Patient ID: William Shelton, male    DOB: 1951-06-29, 66 y.o.   MRN: [id number]  HPI: William Shelton is a 67 y.o. male presenting on 11/05/2017 for Medicare Wellness (Pt provided recent home BP readings and exercise log.); Snoring (Pt snores, per wife.); and Foot Injury (Wants left foot checked. Denies any pain. Thinks may have tendon injury.)   Brings log of blood pressures - wonderful control! Member of [employer] - walks/hikes 3-11 miles daily.   Wife endorses he snores a lot at night. No PNDyspnea. No witnessed apneic episodes. Endorses restorative sleeping. Mild daytime somnolence.  L foot pain - felt pop of dorsal foot 1 month ago with residual discomfort for several days but now feeling better.   Fall screen - passed Depression screen - passed  Preventative: COLONOSCOPY Date: 07/12/08 Internal hemorrhoids---Dr. Fuller Plan.  Prostate - elevated PSA followed by Dr Erlene Quan. h/o low risk velocity.Biopsy normal 12/2014. Prostate MRI - reassuring 10/2016. Strong stream.No nocturia. + fmhx in form of father with prostate cancer dx age 41s, controlled with seeds.  Flu shot yearly  Prevnar 09/2015, pneumovax 2018 Td 2009  zostavax - 07/2014  shingrix - discussed Advanced directive: has at home. HCPOA would be wife. Asked to bring copy to update chart. Seat belt use discussed Sunscreen use discussed. No changing moles on skin Nonsmoker Alcohol - rare  Married; lives with wife  1 son  Retired Microbiologist  Activity: walks daily 5-10 miles daily, minimum 3 miles.  Diet:  good water, fruits/vegetables daily   Relevant past medical, surgical, family and social history reviewed and updated as indicated. Interim medical history since our last visit reviewed. Allergies and medications reviewed and updated. Outpatient Medications Prior to Visit  Medication Sig Dispense Refill  . amLODipine (NORVASC) 10 MG tablet Take 1 tablet (10 mg total) by mouth daily. 90 tablet 3  . Cholecalciferol (VITAMIN D) 2000 units CAPS Take 1 capsule (2,000 Units total) by mouth daily. 30 capsule   . EPINEPHrine 0.3 mg/0.3 mL IJ SOAJ injection Inject 0.3 mLs (0.3 mg total) into the muscle once. 1 Device 1  . Multiple Vitamin (MULTIVITAMIN) capsule Take 1 capsule by mouth daily.     No facility-administered medications prior to visit.      Per HPI unless specifically indicated in ROS section below Review of Systems  Constitutional: Negative for activity change, appetite change, chills, fatigue, fever and unexpected weight change.  HENT: Negative for hearing loss.   Eyes: Negative for visual disturbance.  Respiratory: Negative for cough, chest tightness, shortness of breath and wheezing.   Cardiovascular: Negative for chest pain, palpitations and leg swelling.  Gastrointestinal: Negative for abdominal distention, abdominal pain, blood in stool, constipation, diarrhea, nausea and vomiting.  Genitourinary: Negative for difficulty urinating and hematuria.  Musculoskeletal: Negative for arthralgias, myalgias and neck pain.  Skin: Negative for rash.  Neurological: Negative for dizziness, seizures, syncope and headaches.  Hematological: Negative for adenopathy. Does not bruise/bleed easily.  Psychiatric/Behavioral: Negative for dysphoric mood. The patient is not nervous/anxious.  Objective:    BP 118/82 (BP Location: Left Arm, Patient Position: Sitting, Cuff Size: Normal)   Pulse 65   Temp 97.9 F (36.6 C) (Oral)   Ht 6' (1.829 m)   Wt 174 lb 4 oz (79 kg)   SpO2 98%   BMI  23.63 kg/m   Wt Readings from Last 3 Encounters:  11/05/17 174 lb 4 oz (79 kg)  08/22/17 181 lb 9.6 oz (82.4 kg)  02/19/17 173 lb (78.5 kg)    Physical Exam  Constitutional: He is oriented to person, place, and time. He appears well-developed and well-nourished. No distress.  HENT:  Head: Normocephalic and atraumatic.  Right Ear: Hearing, tympanic membrane, external ear and ear canal normal.  Left Ear: Hearing, tympanic membrane, external ear and ear canal normal.  Nose: Nose normal.  Mouth/Throat: Uvula is midline, oropharynx is clear and moist and mucous membranes are normal. No oropharyngeal exudate, posterior oropharyngeal edema or posterior oropharyngeal erythema.  Eyes: Conjunctivae and EOM are normal. Pupils are equal, round, and reactive to light. No scleral icterus.  Neck: Normal range of motion. Neck supple. Carotid bruit is not present. No thyromegaly present.  Cardiovascular: Normal rate, regular rhythm, normal heart sounds and intact distal pulses.  No murmur heard. Pulses:      Radial pulses are 2+ on the right side, and 2+ on the left side.  Pulmonary/Chest: Effort normal and breath sounds normal. No respiratory distress. He has no wheezes. He has no rales.  Abdominal: Soft. Bowel sounds are normal. He exhibits no distension and no mass. There is no tenderness. There is no rebound and no guarding.  Musculoskeletal: Normal range of motion. He exhibits no edema.  Lymphadenopathy:    He has no cervical adenopathy.  Neurological: He is alert and oriented to person, place, and time.  CN grossly intact, station and gait intact Recall 3/3  calculation 5/5 serial 3s   Skin: Skin is warm and dry. No rash noted.  Psychiatric: He has a normal mood and affect. His behavior is normal. Judgment and thought content normal.  Nursing note and vitals reviewed.  Results for orders placed or performed in visit on 10/29/17  VITAMIN D 25 Hydroxy (Vit-D Deficiency, Fractures)  Result  Value Ref Range   VITD 44.20 30.00 - 100.00 ng/mL  Comprehensive metabolic panel  Result Value Ref Range   Sodium 141 135 - 145 mEq/L   Potassium 4.5 3.5 - 5.1 mEq/L   Chloride 106 96 - 112 mEq/L   CO2 29 19 - 32 mEq/L   Glucose, Bld 98 70 - 99 mg/dL   BUN 25 (H) 6 - 23 mg/dL   Creatinine, Ser 1.22 0.40 - 1.50 mg/dL   Total Bilirubin 0.5 0.2 - 1.2 mg/dL   Alkaline Phosphatase 84 39 - 117 U/L   AST 22 0 - 37 U/L   ALT 20 0 - 53 U/L   Total Protein 7.0 6.0 - 8.3 g/dL   Albumin 4.0 3.5 - 5.2 g/dL   Calcium 9.0 8.4 - 10.5 mg/dL   GFR 62.86 >60.00 mL/min  Lipid panel  Result Value Ref Range   Cholesterol 200 0 - 200 mg/dL   Triglycerides 83.0 0.0 - 149.0 mg/dL   HDL 51.70 >39.00 mg/dL   VLDL 16.6 0.0 - 40.0 mg/dL   LDL Cholesterol 132 (H) 0 - 99 mg/dL   Total CHOL/HDL Ratio 4    NonHDL 148.28       Assessment & Plan:   Problem List  Items Addressed This Visit    Advanced care planning/counseling discussion    Advanced directive: has at home. HCPOA would be wife. Asked to bring copy to update chart.      Chronic kidney disease (CKD), stage III (moderate) (HCC)    Chronic, improved. Continue to monitor.       Elevated prostate specific antigen (PSA)    Appreciate uro care.       Essential hypertension    Chronic, stable. Continue current regimen.       Health maintenance examination    Preventative protocols reviewed and updated unless pt declined. Discussed healthy diet and lifestyle.       HLD (hyperlipidemia)    Chronic, stable off statin.  The 10-year ASCVD risk score Mikey Bussing DC Brooke Bonito., et al., 2013) is: 14.6%   Values used to calculate the score:     Age: 71 years     Sex: Male     Is Non-Hispanic African American: No     Diabetic: No     Tobacco smoker: No     Systolic Blood Pressure: 010 mmHg     Is BP treated: Yes     HDL Cholesterol: 51.7 mg/dL     Total Cholesterol: 200 mg/dL       Left foot pain    Benign exam - ?subluxation of tendon with residual  irritation, now resolved. He hikes without problem. Will monitor for now.       Medicare annual wellness visit, subsequent - Primary    I have personally reviewed the Medicare Annual Wellness questionnaire and have noted 1. The patient's medical and social history 2. Their use of alcohol, tobacco or illicit drugs 3. Their current medications and supplements 4. The patient's functional ability including ADL's, fall risks, home safety risks and hearing or visual impairment. Cognitive function has been assessed and addressed as indicated.  5. Diet and physical activity 6. Evidence for depression or mood disorders The patients weight, height, BMI have been recorded in the chart. I have made referrals, counseling and provided education to the patient based on review of the above and I have provided the pt with a written personalized care plan for preventive services. Provider list updated.. See scanned questionairre as needed for further documentation. Reviewed preventative protocols and updated unless pt declined.       Snoring    Does not sound like OSA. Forgot to provide ESS - will mail.       Vitamin D deficiency    repleting well. Continue.           No orders of the defined types were placed in this encounter.  No orders of the defined types were placed in this encounter.   Follow up plan: Return in about 1 year (around 11/05/2018) for annual exam, prior fasting for blood work, medicare wellness visit.  Ria Bush, MD

## 2017-11-05 NOTE — Assessment & Plan Note (Signed)
Chronic, stable off statin.  The 10-year ASCVD risk score William Shelton., et al., 2013) is: 14.6%   Values used to calculate the score:     Age: 67 years     Sex: Male     Is Non-Hispanic African American: No     Diabetic: No     Tobacco smoker: No     Systolic Blood Pressure: 801 mmHg     Is BP treated: Yes     HDL Cholesterol: 51.7 mg/dL     Total Cholesterol: 200 mg/dL

## 2017-11-05 NOTE — Assessment & Plan Note (Signed)
Benign exam - ?subluxation of tendon with residual irritation, now resolved. He hikes without problem. Will monitor for now.

## 2017-11-05 NOTE — Assessment & Plan Note (Signed)
Preventative protocols reviewed and updated unless pt declined. Discussed healthy diet and lifestyle.  

## 2017-11-05 NOTE — Assessment & Plan Note (Signed)
Does not sound like OSA. Forgot to provide ESS - will mail.

## 2017-11-05 NOTE — Assessment & Plan Note (Signed)
Advanced directive: has at home. HCPOA would be wife. Asked to bring copy to update chart. 

## 2017-11-12 ENCOUNTER — Telehealth: Payer: Self-pay | Admitting: Family Medicine

## 2017-11-12 DIAGNOSIS — R0683 Snoring: Secondary | ICD-10-CM

## 2017-11-12 NOTE — Telephone Encounter (Signed)
I forgot to provide him with this at office visit but I asked Lattie Haw to mail him the [place]. Did he not receive it yet? If not, may mail another - placed in Lisa's box.

## 2017-11-12 NOTE — Telephone Encounter (Signed)
Spoke to pt he stated the last time he saw you, You said something to him about a sleep apena questionnaire.  He wanted to know where he could get this at

## 2017-11-13 NOTE — Telephone Encounter (Signed)
Pt dropped off epworth sleepiness scale form On cart to be delivered to dr g

## 2017-11-13 NOTE — Telephone Encounter (Signed)
Spoke with pt, says he received questionnaire yesterday and will probably drop it off today.

## 2017-11-16 NOTE — Telephone Encounter (Signed)
ESS = 6 plz notify sleep score was borderline.  Recommend we just monitor this for now. If more trouble with non-restorative sleep or daytime somnolence, to let me know.

## 2017-11-17 NOTE — Telephone Encounter (Signed)
Spoke with pt relaying message per Dr. G. Pt verbalizes understanding. 

## 2018-04-04 ENCOUNTER — Encounter: Payer: Self-pay | Admitting: Family Medicine

## 2018-04-04 ENCOUNTER — Encounter: Payer: Self-pay | Admitting: Urology

## 2018-04-04 LAB — PSA: PSA: 4.4

## 2018-04-08 DIAGNOSIS — H16041 Marginal corneal ulcer, right eye: Secondary | ICD-10-CM | POA: Diagnosis not present

## 2018-04-11 NOTE — Telephone Encounter (Signed)
plz add PSA in attachment into chart (4.4). Let me know if any trouble opening file.

## 2018-04-15 ENCOUNTER — Encounter: Payer: Self-pay | Admitting: Family Medicine

## 2018-04-15 NOTE — Telephone Encounter (Signed)
Documented PSA into pt's chart. Printed results and placed in Dr. Synthia Innocent box.

## 2018-07-13 ENCOUNTER — Encounter: Payer: Self-pay | Admitting: Gastroenterology

## 2018-08-20 ENCOUNTER — Other Ambulatory Visit: Payer: Self-pay

## 2018-08-20 DIAGNOSIS — R972 Elevated prostate specific antigen [PSA]: Secondary | ICD-10-CM

## 2018-08-21 ENCOUNTER — Other Ambulatory Visit: Payer: PPO

## 2018-08-21 DIAGNOSIS — R972 Elevated prostate specific antigen [PSA]: Secondary | ICD-10-CM

## 2018-08-22 LAB — PSA: Prostate Specific Ag, Serum: 6.6 ng/mL — ABNORMAL HIGH (ref 0.0–4.0)

## 2018-08-26 ENCOUNTER — Encounter: Payer: Self-pay | Admitting: Urology

## 2018-08-26 ENCOUNTER — Ambulatory Visit: Payer: PPO | Admitting: Urology

## 2018-08-26 ENCOUNTER — Other Ambulatory Visit: Payer: Self-pay

## 2018-08-26 VITALS — BP 150/86 | HR 76 | Ht 72.0 in | Wt 181.0 lb

## 2018-08-26 DIAGNOSIS — N183 Chronic kidney disease, stage 3 unspecified: Secondary | ICD-10-CM

## 2018-08-26 DIAGNOSIS — R972 Elevated prostate specific antigen [PSA]: Secondary | ICD-10-CM

## 2018-08-26 NOTE — Progress Notes (Signed)
08/26/2018 8:44 AM   Leonie Man Lavonia Drafts 08/14/1951 [id number]  Referring provider: Ria Bush, MD [address] [place], [place] [address]  Chief Complaint  Patient presents with  . Elevated PSA    HPI: William Shelton is a 67 yo M with a personal history of elevated PSA who returns today for a 1-year f/u. PSA trend below.   Pt reports of doing well overall and denies gross hematuria, burning and experiences no urinary symptoms. Pt reports of flu prior to PSA testing this year.   6.6 on 08/21/2018 4.7  On 08/20/2017 6.0 on 02/17/17   6.6 on 10/11/16  --> MRI 10/2016 negative, no high grade lesions 6.7 on 03/25/16 4.95 on 10/06/15 5.8 on 10/16 4.20 October 2014  --> negative prostate biopsy (unable to pull up records, TRUS volume unknown) 4.22 July 2014 2.04 2014 4.25 2013  Background history:  He has a significant family history of prostate cancer in his father, grandfather, great-grandfather. His father was diagnosed in his 2s with prostate cancer and died at age 80 of other etiologies. He does have baseline renal insufficiency, most recent creatinine improved to 1.21 from baseline ~1.5 on 10/06/2015.  PMH: Past Medical History:  Diagnosis Date  . Allergic rhinitis, cause unspecified   . Arthritis   . BPH (benign prostatic hypertrophy)   . BPV (benign positional vertigo) 09/28/2015  . Chronic kidney disease (CKD), stage III (moderate) ([place]) 10/22/2011  . ED (erectile dysfunction)   . Elevated prostate specific antigen (PSA) 2016   eval by Dr Erlene Quan - s/p benign biopsy   . Essential hypertension 08/04/2014  . Hematuria, microscopic   . HLD (hyperlipidemia)   . Hydrocele, left   . IBS (irritable bowel syndrome)   . Internal hemorrhoid     Surgical History: Past Surgical History:  Procedure Laterality Date  . COLONOSCOPY  07/12/08   Internal hemorrhoids---Dr. Fuller Plan  . INGUINAL HERNIA REPAIR  07/21/02   Left---Dr. Bary Castilla    Home Medications:    Allergies as of 08/26/2018   No Known Allergies     Medication List        Accurate as of 08/26/18  8:44 AM. Always use your most recent med list.          amLODipine 10 MG tablet Commonly known as:  NORVASC TAKE 1 TABLET (10 MG TOTAL) BY MOUTH DAILY.   EPINEPHrine 0.3 mg/0.3 mL Soaj injection Commonly known as:  EPI-PEN Inject 0.3 mLs (0.3 mg total) into the muscle once.   multivitamin capsule Take 1 capsule by mouth daily.   Vitamin D 50 MCG (2000 UT) Caps Take 1 capsule (2,000 Units total) by mouth daily.       Allergies: No Known Allergies  Family History: Family History  Problem Relation Age of Onset  . Hypertension Father   . Prostate cancer Father 22       Radiation seeds  . Macular degeneration Father   . Hyperlipidemia Brother   . Other Brother        BPH  . Diabetes Neg Hx   . Breast cancer Neg Hx   . Colon cancer Neg Hx   . Depression Neg Hx   . Alcohol abuse Neg Hx   . Stroke Neg Hx   . CAD Neg Hx     Social History:  reports that he has never smoked. He has never used smokeless tobacco. He reports that he does not drink alcohol or use drugs.  ROS: UROLOGY  Frequent Urination?: No Hard to postpone urination?: No Burning/pain with urination?: No Get up at night to urinate?: No Leakage of urine?: No Urine stream starts and stops?: No Trouble starting stream?: No Do you have to strain to urinate?: No Blood in urine?: No Urinary tract infection?: No Sexually transmitted disease?: No Injury to kidneys or bladder?: No Painful intercourse?: No Weak stream?: No Erection problems?: No Penile pain?: No  Gastrointestinal Nausea?: No Vomiting?: No Indigestion/heartburn?: No Diarrhea?: No Constipation?: No  Constitutional Fever: No Night sweats?: No Weight loss?: No Fatigue?: No  Skin Skin rash/lesions?: No Itching?: No  Eyes Blurred vision?: No Double vision?: No  Ears/Nose/Throat Sore throat?: No Sinus problems?:  No  Hematologic/Lymphatic Swollen glands?: No Easy bruising?: No  Cardiovascular Leg swelling?: No Chest pain?: No  Respiratory Cough?: No Shortness of breath?: No  Endocrine Excessive thirst?: No  Musculoskeletal Back pain?: No Joint pain?: No  Neurological Headaches?: No Dizziness?: No  Psychologic Depression?: No Anxiety?: No  Physical Exam: BP (!) 150/86   Pulse 76   Ht 6' (1.829 m)   Wt 181 lb (82.1 kg)   BMI 24.55 kg/m   Constitutional:  Alert and oriented, No acute distress. HEENT: [place] AT, moist mucus membranes.  Trachea midline, no masses. Cardiovascular: No clubbing, cyanosis, or edema. Respiratory: Normal respiratory effort, no increased work of breathing. Rectal: External hemmorhoid, smooth rectal tone, 50 cc prostate gland, no nodules, non tender.  Skin: No rashes, bruises or suspicious lesions. Neurologic: Grossly intact, no focal deficits, moving all 4 extremities. Psychiatric: Normal mood and affect.  Assessment & Plan:    1. Elevated PSA / Rising PSA  -Most recent PSA is 6.6, stable from previous visit  -Recent MRI reassuring in 2018 -Appropriate for continued observatoin at this time -Return in 6 months for PSA (lab only); 12 months with MD for PSA/DRE   2.Marland Kitchen CKD  -Cr 1.22 stable from 10/29/2017  Return in 6 months for PSA(lab only); 12 months with MD for PSA/DRE   Return in about 6 months (around 02/25/2019) for PSA(lab only); 12 months with MD for PSA/DRE.  [facility] [address] [place], [place] [address] [phone]  I, Lucas Mallow, am acting as a scribe for Dr. [name],  I have reviewed the above documentation for accuracy and completeness, and I agree with the above.   [name], MD

## 2018-09-28 ENCOUNTER — Encounter: Payer: Self-pay | Admitting: Family Medicine

## 2018-10-24 ENCOUNTER — Other Ambulatory Visit: Payer: Self-pay | Admitting: Family Medicine

## 2018-11-01 ENCOUNTER — Other Ambulatory Visit: Payer: Self-pay | Admitting: Family Medicine

## 2018-11-01 DIAGNOSIS — N183 Chronic kidney disease, stage 3 unspecified: Secondary | ICD-10-CM

## 2018-11-01 DIAGNOSIS — R972 Elevated prostate specific antigen [PSA]: Secondary | ICD-10-CM

## 2018-11-01 DIAGNOSIS — E559 Vitamin D deficiency, unspecified: Secondary | ICD-10-CM

## 2018-11-01 DIAGNOSIS — E785 Hyperlipidemia, unspecified: Secondary | ICD-10-CM

## 2018-11-03 ENCOUNTER — Other Ambulatory Visit (INDEPENDENT_AMBULATORY_CARE_PROVIDER_SITE_OTHER): Payer: PPO

## 2018-11-03 DIAGNOSIS — E559 Vitamin D deficiency, unspecified: Secondary | ICD-10-CM | POA: Diagnosis not present

## 2018-11-03 DIAGNOSIS — E785 Hyperlipidemia, unspecified: Secondary | ICD-10-CM

## 2018-11-03 DIAGNOSIS — N183 Chronic kidney disease, stage 3 unspecified: Secondary | ICD-10-CM

## 2018-11-03 LAB — CBC WITH DIFFERENTIAL/PLATELET
Basophils Absolute: 0 10*3/uL (ref 0.0–0.1)
Basophils Relative: 0.7 % (ref 0.0–3.0)
Eosinophils Absolute: 0.5 10*3/uL (ref 0.0–0.7)
Eosinophils Relative: 7.1 % — ABNORMAL HIGH (ref 0.0–5.0)
HCT: 45.8 % (ref 39.0–52.0)
HEMOGLOBIN: 15.2 g/dL (ref 13.0–17.0)
Lymphocytes Relative: 34.6 % (ref 12.0–46.0)
Lymphs Abs: 2.5 10*3/uL (ref 0.7–4.0)
MCHC: 33.1 g/dL (ref 30.0–36.0)
MCV: 90.2 fl (ref 78.0–100.0)
Monocytes Absolute: 0.8 10*3/uL (ref 0.1–1.0)
Monocytes Relative: 10.8 % (ref 3.0–12.0)
Neutro Abs: 3.3 10*3/uL (ref 1.4–7.7)
Neutrophils Relative %: 46.8 % (ref 43.0–77.0)
Platelets: 261 10*3/uL (ref 150.0–400.0)
RBC: 5.08 Mil/uL (ref 4.22–5.81)
RDW: 14.4 % (ref 11.5–15.5)
WBC: 7.1 10*3/uL (ref 4.0–10.5)

## 2018-11-03 LAB — COMPREHENSIVE METABOLIC PANEL
ALT: 25 U/L (ref 0–53)
AST: 33 U/L (ref 0–37)
Albumin: 4.4 g/dL (ref 3.5–5.2)
Alkaline Phosphatase: 91 U/L (ref 39–117)
BILIRUBIN TOTAL: 0.6 mg/dL (ref 0.2–1.2)
BUN: 24 mg/dL — ABNORMAL HIGH (ref 6–23)
CO2: 27 mEq/L (ref 19–32)
Calcium: 9.5 mg/dL (ref 8.4–10.5)
Chloride: 104 mEq/L (ref 96–112)
Creatinine, Ser: 1.35 mg/dL (ref 0.40–1.50)
GFR: 52.46 mL/min — ABNORMAL LOW (ref >60.00)
Glucose, Bld: 90 mg/dL (ref 70–99)
Potassium: 4.3 mEq/L (ref 3.5–5.1)
Sodium: 140 mEq/L (ref 135–145)
Total Protein: 7.6 g/dL (ref 6.0–8.3)

## 2018-11-03 LAB — LIPID PANEL
Cholesterol: 223 mg/dL — ABNORMAL HIGH (ref 0–200)
HDL: 64.5 mg/dL (ref >39.00)
LDL Cholesterol: 138 mg/dL — ABNORMAL HIGH (ref 0–99)
NonHDL: 158.59
Total CHOL/HDL Ratio: 3
Triglycerides: 105 mg/dL (ref 0.0–149.0)
VLDL: 21 mg/dL (ref 0.0–40.0)

## 2018-11-03 LAB — VITAMIN D 25 HYDROXY (VIT D DEFICIENCY, FRACTURES): VITD: 49 ng/mL (ref 30.00–100.00)

## 2018-11-03 LAB — MICROALBUMIN / CREATININE URINE RATIO
Creatinine,U: 114.7 mg/dL
Microalb Creat Ratio: 3.4 mg/g (ref 0.0–30.0)
Microalb, Ur: 3.9 mg/dL — ABNORMAL HIGH (ref 0.0–1.9)

## 2018-11-04 ENCOUNTER — Encounter: Payer: Self-pay | Admitting: Family Medicine

## 2018-11-04 DIAGNOSIS — D721 Eosinophilia, unspecified: Secondary | ICD-10-CM | POA: Insufficient documentation

## 2018-11-06 ENCOUNTER — Other Ambulatory Visit: Payer: PPO

## 2018-11-10 ENCOUNTER — Encounter: Payer: Self-pay | Admitting: Family Medicine

## 2018-11-10 ENCOUNTER — Ambulatory Visit (INDEPENDENT_AMBULATORY_CARE_PROVIDER_SITE_OTHER): Payer: PPO | Admitting: Family Medicine

## 2018-11-10 VITALS — BP 118/82 | HR 64 | Temp 97.6°F | Ht 71.75 in | Wt 175.6 lb

## 2018-11-10 DIAGNOSIS — N183 Chronic kidney disease, stage 3 unspecified: Secondary | ICD-10-CM

## 2018-11-10 DIAGNOSIS — E785 Hyperlipidemia, unspecified: Secondary | ICD-10-CM

## 2018-11-10 DIAGNOSIS — Z0000 Encounter for general adult medical examination without abnormal findings: Secondary | ICD-10-CM

## 2018-11-10 DIAGNOSIS — Z7189 Other specified counseling: Secondary | ICD-10-CM

## 2018-11-10 DIAGNOSIS — E559 Vitamin D deficiency, unspecified: Secondary | ICD-10-CM

## 2018-11-10 DIAGNOSIS — R972 Elevated prostate specific antigen [PSA]: Secondary | ICD-10-CM

## 2018-11-10 DIAGNOSIS — M79671 Pain in right foot: Secondary | ICD-10-CM | POA: Insufficient documentation

## 2018-11-10 DIAGNOSIS — J309 Allergic rhinitis, unspecified: Secondary | ICD-10-CM

## 2018-11-10 DIAGNOSIS — D721 Eosinophilia, unspecified: Secondary | ICD-10-CM

## 2018-11-10 DIAGNOSIS — I10 Essential (primary) hypertension: Secondary | ICD-10-CM

## 2018-11-10 NOTE — Assessment & Plan Note (Signed)

## 2018-11-10 NOTE — Assessment & Plan Note (Signed)
Good levels with replacement.

## 2018-11-10 NOTE — Assessment & Plan Note (Signed)
Preventative protocols reviewed and updated unless pt declined. Discussed healthy diet and lifestyle.  

## 2018-11-10 NOTE — Assessment & Plan Note (Signed)
Chronic. No h/o parasite infection. Possibly related to allergic rhinitis. Will continue to monitor. Discussed with patient.

## 2018-11-10 NOTE — Assessment & Plan Note (Signed)
Chronic, stable. LDL and TC mildly elevated. Consider statin.  The 10-year ASCVD risk score Mikey Bussing DC Brooke Bonito., et al., 2013) is: 15%   Values used to calculate the score:     Age: 68 years     Sex: Male     Is Non-Hispanic African American: No     Diabetic: No     Tobacco smoker: No     Systolic Blood Pressure: 228 mmHg     Is BP treated: Yes     HDL Cholesterol: 64.5 mg/dL     Total Cholesterol: 223 mg/dL

## 2018-11-10 NOTE — Assessment & Plan Note (Signed)
Deteriorated today. Encouraged good hydration status, avoiding NSAIDs

## 2018-11-10 NOTE — Assessment & Plan Note (Signed)
Appreciate uro care 

## 2018-11-10 NOTE — Patient Instructions (Addendum)
If interested, check with pharmacy about new 2 shot shingles series (shingrix).  Bring me copy of living will.  You are doing well today Ensure good water intake.  Return as needed or in 1 year for next physical and wellness visit.  Health Maintenance After Age 69 After age 7, you are at a higher risk for certain long-term diseases and infections as well as injuries from falls. Falls are a major cause of broken bones and head injuries in people who are older than age 6. Getting regular preventive care can help to keep you healthy and well. Preventive care includes getting regular testing and making lifestyle changes as recommended by your health care provider. Talk with your health care provider about:  Which screenings and tests you should have. A screening is a test that checks for a disease when you have no symptoms.  A diet and exercise plan that is right for you. What should I know about screenings and tests to prevent falls? Screening and testing are the best ways to find a health problem early. Early diagnosis and treatment give you the best chance of managing medical conditions that are common after age 57. Certain conditions and lifestyle choices may make you more likely to have a fall. Your health care provider may recommend:  Regular vision checks. Poor vision and conditions such as cataracts can make you more likely to have a fall. If you wear glasses, make sure to get your prescription updated if your vision changes.  Medicine review. Work with your health care provider to regularly review all of the medicines you are taking, including over-the-counter medicines. Ask your health care provider about any side effects that may make you more likely to have a fall. Tell your health care provider if any medicines that you take make you feel dizzy or sleepy.  Osteoporosis screening. Osteoporosis is a condition that causes the bones to get weaker. This can make the bones weak and cause them  to break more easily.  Blood pressure screening. Blood pressure changes and medicines to control blood pressure can make you feel dizzy.  Strength and balance checks. Your health care provider may recommend certain tests to check your strength and balance while standing, walking, or changing positions.  Foot health exam. Foot pain and numbness, as well as not wearing proper footwear, can make you more likely to have a fall.  Depression screening. You may be more likely to have a fall if you have a fear of falling, feel emotionally low, or feel unable to do activities that you used to do.  Alcohol use screening. Using too much alcohol can affect your balance and may make you more likely to have a fall. What actions can I take to lower my risk of falls? General instructions  Talk with your health care provider about your risks for falling. Tell your health care provider if: ? You fall. Be sure to tell your health care provider about all falls, even ones that seem minor. ? You feel dizzy, sleepy, or off-balance.  Take over-the-counter and prescription medicines only as told by your health care provider. These include any supplements.  Eat a healthy diet and maintain a healthy weight. A healthy diet includes low-fat dairy products, low-fat (lean) meats, and fiber from whole grains, beans, and lots of fruits and vegetables. Home safety  Remove any tripping hazards, such as rugs, cords, and clutter.  Install safety equipment such as grab bars in bathrooms and safety rails on stairs.  Keep rooms and walkways well-lit. Activity   Follow a regular exercise program to stay fit. This will help you maintain your balance. Ask your health care provider what types of exercise are appropriate for you.  If you need a cane or walker, use it as recommended by your health care provider.  Wear supportive shoes that have nonskid soles. Lifestyle  Do not drink alcohol if your health care provider  tells you not to drink.  If you drink alcohol, limit how much you have: ? 0-1 drink a day for women. ? 0-2 drinks a day for men.  Be aware of how much alcohol is in your drink. In the [place] one drink equals one typical bottle of beer (12 oz), one-half glass of wine (5 oz), or one shot of hard liquor (1 oz).  Do not use any products that contain nicotine or tobacco, such as cigarettes and e-cigarettes. If you need help quitting, ask your health care provider. Summary  Having a healthy lifestyle and getting preventive care can help to protect your health and wellness after age 69.  Screening and testing are the best way to find a health problem early and help you avoid having a fall. Early diagnosis and treatment give you the best chance for managing medical conditions that are more common for people who are older than age 54.  Falls are a major cause of broken bones and head injuries in people who are older than age 51. Take precautions to prevent a fall at home.  Work with your health care provider to learn what changes you can make to improve your health and wellness and to prevent falls. This information is not intended to replace advice given to you by your health care provider. Make sure you discuss any questions you have with your health care provider. Document Released: 07/16/2017 Document Revised: 07/16/2017 Document Reviewed: 07/16/2017 Elsevier Interactive Patient Education  2019 [employer].

## 2018-11-10 NOTE — Assessment & Plan Note (Signed)
Chronic, stable. Continue current regimen. 

## 2018-11-10 NOTE — Assessment & Plan Note (Signed)
Advanced directive: has at home. HCPOA would be wife. Asked to bring copy to update chart. 

## 2018-11-10 NOTE — Assessment & Plan Note (Signed)
Anticipate plantar fasciitis. Discussed stretching and frozen water bottle massage as well as importance of supportive shoeware.

## 2018-11-10 NOTE — Progress Notes (Signed)
BP 118/82 (BP Location: Left Arm, Patient Position: Sitting, Cuff Size: Normal)   Pulse 64   Temp 97.6 F (36.4 C) (Oral)   Ht 5' 11.75" (1.822 m)   Wt 175 lb 9 oz (79.6 kg)   SpO2 100%   BMI 23.98 kg/m    CC: CPE Subjective:    Patient ID: William Shelton, male    DOB: [birth year], 68 y.o.   MRN: [id number]  HPI: William Shelton is a 68 y.o. male presenting on 11/10/2018 for Medicare Wellness   Did not see Katha Cabal this year.  Member of [employer] - walks/hikes 3-11 miles daily (>100 mi/mo). Noticing some discomfort R inner knee.notes R heel pain when walking barefoot in house.   Spot on L forearm after bad sunburn several years ago. May be enlarging. Would like this checked out.   Gets a few tick bites each year. Asks about lyme prevention.   Chronic eosinophilia - endorses intermittent allergies, worse in spring. Denies h/o parasites.   Hearing Screening Comments: Wears bilateral hearing aids but left them home today. Vision Screening Comments: Last eye exam 10/2017. Has eye exam coming up.  Depression screen - passed Fall screen - passed  Preventative: COLONOSCOPY Date: 07/12/08 Internal hemorrhoids Fuller Plan). Will call to schedule rpt. Prostate cancer screening -elevated PSAfollowed by Dr Erlene Quan. h/o low risk velocity.Biopsy normal 12/2014. Prostate MRI reassuring 10/2016.+ fmhx father with prostate cancer dx age 15s, controlled with seeds.  Flu shot yearly Prevnar1/2017, pneumovax 2018 Td 2009  zostavax - 07/2014  shingrix - discussed Advanced directive: has at home. HCPOA would be wife. Asked to bring copy to update chart. Seat belt use discussed Sunscreen use discussed. No changing moles on skin Nonsmoker Alcohol - rare Dentist q6 mo  Eye exam yearly   Married; lives with wife  1 son  Retired Microbiologist  Activity: walks daily5-10 milesdaily, minimum 3 miles. Diet: good water,  fruits/vegetables daily     Relevant past medical, surgical, family and social history reviewed and updated as indicated. Interim medical history since our last visit reviewed. Allergies and medications reviewed and updated. Outpatient Medications Prior to Visit  Medication Sig Dispense Refill  . amLODipine (NORVASC) 10 MG tablet TAKE 1 TABLET (10 MG TOTAL) BY MOUTH DAILY. 90 tablet 0  . Cholecalciferol (VITAMIN D) 2000 units CAPS Take 1 capsule (2,000 Units total) by mouth daily. 30 capsule   . EPINEPHrine 0.3 mg/0.3 mL IJ SOAJ injection Inject 0.3 mLs (0.3 mg total) into the muscle once. 1 Device 1  . Multiple Vitamin (MULTIVITAMIN) capsule Take 1 capsule by mouth daily.    . Omega-3 Fatty Acids (FISH OIL PO) Take 1 capsule by mouth daily.     No facility-administered medications prior to visit.      Per HPI unless specifically indicated in ROS section below Review of Systems  Constitutional: Negative for activity change, appetite change, chills, fatigue, fever and unexpected weight change.  HENT: Negative for hearing loss.   Eyes: Negative for visual disturbance.  Respiratory: Negative for cough, chest tightness, shortness of breath and wheezing.   Cardiovascular: Negative for chest pain, palpitations and leg swelling.  Gastrointestinal: Negative for abdominal distention, abdominal pain, blood in stool, constipation, diarrhea, nausea and vomiting.  Genitourinary: Negative for difficulty urinating and hematuria.  Musculoskeletal: Negative for arthralgias, myalgias and neck pain.  Skin: Negative for rash.  Neurological: Negative for dizziness, seizures, syncope and headaches.  Hematological: Negative for adenopathy. Does not bruise/bleed  easily.  Psychiatric/Behavioral: Negative for dysphoric mood. The patient is not nervous/anxious.    Objective:    BP 118/82 (BP Location: Left Arm, Patient Position: Sitting, Cuff Size: Normal)   Pulse 64   Temp 97.6 F (36.4 C) (Oral)   Ht 5'  11.75" (1.822 m)   Wt 175 lb 9 oz (79.6 kg)   SpO2 100%   BMI 23.98 kg/m   Wt Readings from Last 3 Encounters:  11/10/18 175 lb 9 oz (79.6 kg)  08/26/18 181 lb (82.1 kg)  11/05/17 174 lb 4 oz (79 kg)    Physical Exam Vitals signs and nursing note reviewed.  Constitutional:      General: He is not in acute distress.    Appearance: Normal appearance. He is well-developed.  HENT:     Head: Normocephalic and atraumatic.     Right Ear: Hearing, tympanic membrane, ear canal and external ear normal.     Left Ear: Hearing, tympanic membrane, ear canal and external ear normal.     Nose: Nose normal.     Mouth/Throat:     Mouth: Mucous membranes are moist.     Pharynx: Uvula midline. No oropharyngeal exudate or posterior oropharyngeal erythema.  Eyes:     General: No scleral icterus.    Conjunctiva/sclera: Conjunctivae normal.     Pupils: Pupils are equal, round, and reactive to light.  Neck:     Musculoskeletal: Normal range of motion and neck supple.     Vascular: No carotid bruit.  Cardiovascular:     Rate and Rhythm: Normal rate and regular rhythm.     Pulses: Normal pulses.          Radial pulses are 2+ on the right side and 2+ on the left side.     Heart sounds: Normal heart sounds. No murmur.  Pulmonary:     Effort: Pulmonary effort is normal. No respiratory distress.     Breath sounds: Normal breath sounds. No wheezing, rhonchi or rales.  Abdominal:     General: Bowel sounds are normal. There is no distension.     Palpations: Abdomen is soft. There is no mass.     Tenderness: There is no abdominal tenderness. There is no guarding or rebound.  Musculoskeletal: Normal range of motion.  Lymphadenopathy:     Cervical: No cervical adenopathy.  Skin:    General: Skin is warm and dry.     Findings: No rash.  Neurological:     Mental Status: He is alert and oriented to person, place, and time.     Comments: CN grossly intact, station and gait intact  Psychiatric:         Behavior: Behavior normal.        Thought Content: Thought content normal.        Judgment: Judgment normal.       Results for orders placed or performed in visit on 11/03/18  Microalbumin / creatinine urine ratio  Result Value Ref Range   Microalb, Ur 3.9 (H) 0.0 - 1.9 mg/dL   Creatinine,U 114.7 mg/dL   Microalb Creat Ratio 3.4 0.0 - 30.0 mg/g  VITAMIN D 25 Hydroxy (Vit-D Deficiency, Fractures)  Result Value Ref Range   VITD 49.00 30.00 - 100.00 ng/mL  CBC with Differential/Platelet  Result Value Ref Range   WBC 7.1 4.0 - 10.5 K/uL   RBC 5.08 4.22 - 5.81 Mil/uL   Hemoglobin 15.2 13.0 - 17.0 g/dL   HCT 45.8 39.0 - 52.0 %  MCV 90.2 78.0 - 100.0 fl   MCHC 33.1 30.0 - 36.0 g/dL   RDW 14.4 11.5 - 15.5 %   Platelets 261.0 150.0 - 400.0 K/uL   Neutrophils Relative % 46.8 43.0 - 77.0 %   Lymphocytes Relative 34.6 12.0 - 46.0 %   Monocytes Relative 10.8 3.0 - 12.0 %   Eosinophils Relative 7.1 (H) 0.0 - 5.0 %   Basophils Relative 0.7 0.0 - 3.0 %   Neutro Abs 3.3 1.4 - 7.7 K/uL   Lymphs Abs 2.5 0.7 - 4.0 K/uL   Monocytes Absolute 0.8 0.1 - 1.0 K/uL   Eosinophils Absolute 0.5 0.0 - 0.7 K/uL   Basophils Absolute 0.0 0.0 - 0.1 K/uL  Comprehensive metabolic panel  Result Value Ref Range   Sodium 140 135 - 145 mEq/L   Potassium 4.3 3.5 - 5.1 mEq/L   Chloride 104 96 - 112 mEq/L   CO2 27 19 - 32 mEq/L   Glucose, Bld 90 70 - 99 mg/dL   BUN 24 (H) 6 - 23 mg/dL   Creatinine, Ser 1.35 0.40 - 1.50 mg/dL   Total Bilirubin 0.6 0.2 - 1.2 mg/dL   Alkaline Phosphatase 91 39 - 117 U/L   AST 33 0 - 37 U/L   ALT 25 0 - 53 U/L   Total Protein 7.6 6.0 - 8.3 g/dL   Albumin 4.4 3.5 - 5.2 g/dL   Calcium 9.5 8.4 - 10.5 mg/dL   GFR 52.46 (L) >60.00 mL/min  Lipid panel  Result Value Ref Range   Cholesterol 223 (H) 0 - 200 mg/dL   Triglycerides 105.0 0.0 - 149.0 mg/dL   HDL 64.50 >39.00 mg/dL   VLDL 21.0 0.0 - 40.0 mg/dL   LDL Cholesterol 138 (H) 0 - 99 mg/dL   Total CHOL/HDL Ratio 3    NonHDL  158.59    Assessment & Plan:  Discussed pros/cons of tick ppx abx. He decides to avoid for now.  Problem List Items Addressed This Visit    Vitamin D deficiency    Good levels with replacement.       Right foot pain    Anticipate plantar fasciitis. Discussed stretching and frozen water bottle massage as well as importance of supportive shoeware.      Medicare annual wellness visit, subsequent - Primary    I have personally reviewed the Medicare Annual Wellness questionnaire and have noted 1. The patient's medical and social history 2. Their use of alcohol, tobacco or illicit drugs 3. Their current medications and supplements 4. The patient's functional ability including ADL's, fall risks, home safety risks and hearing or visual impairment. Cognitive function has been assessed and addressed as indicated.  5. Diet and physical activity 6. Evidence for depression or mood disorders The patients weight, height, BMI have been recorded in the chart. I have made referrals, counseling and provided education to the patient based on review of the above and I have provided the pt with a written personalized care plan for preventive services. Provider list updated.. See scanned questionairre as needed for further documentation. Reviewed preventative protocols and updated unless pt declined.       HLD (hyperlipidemia)    Chronic, stable. LDL and TC mildly elevated. Consider statin.  The 10-year ASCVD risk score Mikey Bussing DC Brooke Bonito., et al., 2013) is: 15%   Values used to calculate the score:     Age: 45 years     Sex: Male     Is Non-Hispanic African American: No  Diabetic: No     Tobacco smoker: No     Systolic Blood Pressure: 962 mmHg     Is BP treated: Yes     HDL Cholesterol: 64.5 mg/dL     Total Cholesterol: 223 mg/dL       Health maintenance examination    Preventative protocols reviewed and updated unless pt declined. Discussed healthy diet and lifestyle.       Essential  hypertension    Chronic, stable. Continue current regimen.       Eosinophilia    Chronic. No h/o parasite infection. Possibly related to allergic rhinitis. Will continue to monitor. Discussed with patient.       Elevated prostate specific antigen (PSA)    Appreciate uro care.       Chronic kidney disease (CKD), stage III (moderate) (HCC)    Deteriorated today. Encouraged good hydration status, avoiding NSAIDs      Allergic rhinitis   Advanced care planning/counseling discussion    Advanced directive: has at home. HCPOA would be wife. Asked to bring copy to update chart.          No orders of the defined types were placed in this encounter.  No orders of the defined types were placed in this encounter.   Follow up plan: Return in about 1 year (around 11/11/2019) for annual exam, prior fasting for blood work, medicare wellness visit.  Ria Bush, MD

## 2019-01-19 ENCOUNTER — Other Ambulatory Visit: Payer: Self-pay | Admitting: Family Medicine

## 2019-03-09 DIAGNOSIS — H524 Presbyopia: Secondary | ICD-10-CM | POA: Diagnosis not present

## 2019-03-09 DIAGNOSIS — H5203 Hypermetropia, bilateral: Secondary | ICD-10-CM | POA: Diagnosis not present

## 2019-03-09 DIAGNOSIS — D3131 Benign neoplasm of right choroid: Secondary | ICD-10-CM | POA: Diagnosis not present

## 2019-03-09 DIAGNOSIS — H2513 Age-related nuclear cataract, bilateral: Secondary | ICD-10-CM | POA: Diagnosis not present

## 2019-03-09 DIAGNOSIS — D3132 Benign neoplasm of left choroid: Secondary | ICD-10-CM | POA: Diagnosis not present

## 2019-03-11 ENCOUNTER — Other Ambulatory Visit: Payer: PPO

## 2019-03-11 ENCOUNTER — Other Ambulatory Visit: Payer: Self-pay

## 2019-03-11 DIAGNOSIS — R972 Elevated prostate specific antigen [PSA]: Secondary | ICD-10-CM | POA: Diagnosis not present

## 2019-03-12 ENCOUNTER — Telehealth: Payer: Self-pay | Admitting: *Deleted

## 2019-03-12 LAB — PSA: Prostate Specific Ag, Serum: 5.5 ng/mL — ABNORMAL HIGH (ref 0.0–4.0)

## 2019-03-12 NOTE — Telephone Encounter (Addendum)
Patient advised-verbalized understanding.  ----- Message from [name], MD sent at 03/12/2019  1:38 PM EDT ----- PSA is 5.5.  This is down from 6 months ago which is good.  We will see you in 6 months.  [name], MD

## 2019-06-16 DIAGNOSIS — M7751 Other enthesopathy of right foot: Secondary | ICD-10-CM | POA: Diagnosis not present

## 2019-06-16 DIAGNOSIS — M79671 Pain in right foot: Secondary | ICD-10-CM | POA: Diagnosis not present

## 2019-06-16 DIAGNOSIS — M216X1 Other acquired deformities of right foot: Secondary | ICD-10-CM | POA: Diagnosis not present

## 2019-06-30 DIAGNOSIS — M216X1 Other acquired deformities of right foot: Secondary | ICD-10-CM | POA: Diagnosis not present

## 2019-06-30 DIAGNOSIS — M7751 Other enthesopathy of right foot: Secondary | ICD-10-CM | POA: Diagnosis not present

## 2019-08-30 ENCOUNTER — Other Ambulatory Visit: Payer: Self-pay

## 2019-08-30 ENCOUNTER — Other Ambulatory Visit: Payer: PPO

## 2019-08-30 DIAGNOSIS — R972 Elevated prostate specific antigen [PSA]: Secondary | ICD-10-CM | POA: Diagnosis not present

## 2019-08-31 LAB — PSA: Prostate Specific Ag, Serum: 6.1 ng/mL — ABNORMAL HIGH (ref 0.0–4.0)

## 2019-09-01 ENCOUNTER — Other Ambulatory Visit: Payer: Self-pay

## 2019-09-01 ENCOUNTER — Ambulatory Visit: Payer: PPO | Admitting: Urology

## 2019-09-01 VITALS — BP 138/75 | HR 74 | Ht 72.0 in | Wt 180.0 lb

## 2019-09-01 DIAGNOSIS — N1831 Chronic kidney disease, stage 3a: Secondary | ICD-10-CM | POA: Diagnosis not present

## 2019-09-01 DIAGNOSIS — R972 Elevated prostate specific antigen [PSA]: Secondary | ICD-10-CM

## 2019-09-01 NOTE — Progress Notes (Signed)
09/01/2019 12:16 PM   William Shelton 1951/04/11 [id number]  Referring provider: Ria Bush, MD [address] [place],  [place] [address]  Chief Complaint  Patient presents with  . Elevated PSA    HPI: 68 year old male with a personal history of elevated/fluctuating PSA returns today for routine annual follow-up.  We have been monitoring his PSA closely.  He continues to have no baseline significant urinary symptoms.  No weight loss or bone pain.  No new medical changes this year.  6.1 on 08/30/19 5.5 on 03/11/19 6.6 on 08/21/2018 4.7 On 08/20/2017 6.0 on 02/17/17  6.6 on 10/11/16 -->MRI 10/2016 negative, no high grade lesions 6.7 on 03/25/16 4.95 on 10/06/15 5.8 on 10/16 4.20 October 2014 -->negative prostate biopsy (unable to pull up records, TRUS volume unknown) 4.22 July 2014 2.04 2014 4.25 2013  Background history:  He has a significant family history of prostate cancer in his father, grandfather, great-grandfather. His father was diagnosed in his 81s with prostate cancer and died at age 13 of other etiologies.  He does have baseline renal insufficiency, most recent creatinine 1.35 on 11/05/2018, stable   PMH: Past Medical History:  Diagnosis Date  . Allergic rhinitis, cause unspecified   . Arthritis   . BPH (benign prostatic hypertrophy)   . BPV (benign positional vertigo) 09/28/2015  . Chronic kidney disease (CKD), stage III (moderate) ([place]) 10/22/2011  . ED (erectile dysfunction)   . Elevated prostate specific antigen (PSA) 2016   eval by Dr Erlene Quan - s/p benign biopsy   . Essential hypertension 08/04/2014  . Hematuria, microscopic   . HLD (hyperlipidemia)   . Hydrocele, left   . IBS (irritable bowel syndrome)   . Internal hemorrhoid     Surgical History: Past Surgical History:  Procedure Laterality Date  . COLONOSCOPY  07/12/08   Internal hemorrhoids---Dr. Fuller Plan  . INGUINAL HERNIA REPAIR  07/21/02   Left---Dr. Bary Castilla    Home  Medications:  Allergies as of 09/01/2019   No Known Allergies     Medication List       Accurate as of September 01, 2019 12:16 PM. If you have any questions, ask your nurse or doctor.        amLODipine 10 MG tablet Commonly known as: NORVASC TAKE 1 TABLET BY MOUTH EVERY DAY   EPINEPHrine 0.3 mg/0.3 mL Soaj injection Commonly known as: EPI-PEN Inject 0.3 mLs (0.3 mg total) into the muscle once.   FISH OIL PO Take 1 capsule by mouth daily.   multivitamin capsule Take 1 capsule by mouth daily.   Vitamin D 50 MCG (2000 UT) Caps Take 1 capsule (2,000 Units total) by mouth daily.       Allergies: No Known Allergies  Family History: Family History  Problem Relation Age of Onset  . Hypertension Father   . Prostate cancer Father 31       Radiation seeds  . Macular degeneration Father   . Hyperlipidemia Brother   . Other Brother        BPH  . Diabetes Neg Hx   . Breast cancer Neg Hx   . Colon cancer Neg Hx   . Depression Neg Hx   . Alcohol abuse Neg Hx   . Stroke Neg Hx   . CAD Neg Hx     Social History:  reports that he has never smoked. He has never used smokeless tobacco. He reports that he does not drink alcohol or use drugs.  ROS: UROLOGY Frequent  Urination?: No Hard to postpone urination?: No Burning/pain with urination?: No Get up at night to urinate?: No Leakage of urine?: No Urine stream starts and stops?: No Trouble starting stream?: No Do you have to strain to urinate?: No Blood in urine?: No Urinary tract infection?: No Sexually transmitted disease?: No Injury to kidneys or bladder?: No Painful intercourse?: No Weak stream?: No Erection problems?: No Penile pain?: No  Gastrointestinal Nausea?: No Vomiting?: No Indigestion/heartburn?: No Diarrhea?: No Constipation?: No  Constitutional Fever: No Night sweats?: No Weight loss?: No Fatigue?: No  Skin Skin rash/lesions?: No Itching?: No  Eyes Blurred vision?: No Double vision?:  No  Ears/Nose/Throat Sore throat?: No Sinus problems?: No  Hematologic/Lymphatic Swollen glands?: No Easy bruising?: No  Cardiovascular Leg swelling?: No Chest pain?: No  Respiratory Cough?: No Shortness of breath?: No  Endocrine Excessive thirst?: No  Musculoskeletal Back pain?: No Joint pain?: No  Neurological Headaches?: No Dizziness?: No  Psychologic Depression?: No Anxiety?: No  Physical Exam: BP 138/75   Pulse 74   Ht 6' (1.829 m)   Wt 180 lb (81.6 kg)   BMI 24.41 kg/m   Constitutional:  Alert and oriented, No acute distress. HEENT: [place] AT, moist mucus membranes.  Trachea midline, no masses. Cardiovascular: No clubbing, cyanosis, or edema. Respiratory: Normal respiratory effort, no increased work of breathing. Rectal: Normal sphincter tone, 50 cc prostate, no nodules.  There are some prominence of the right lateral ridge but no discrete nodules.  Nontender. Skin: No rashes, bruises or suspicious lesions. Neurologic: Grossly intact, no focal deficits, moving all 4 extremities. Psychiatric: Normal mood and affect.  Laboratory Data: As above   Assessment & Plan:    1. Elevated PSA Personal history of elevated PSA status post extensive evaluation in the past as above  PSA is relatively stable more than certain ranges last year  As such, I recommended continued close surveillance especially given his family history of prostate cancer.  We will plan for PSA only in 6 months and repeat PSA and rectal exam in 12 months - PSA; Future  2. Stage 3a chronic kidney disease Cr stable    Return for 6 month PSA lab only, 12 months with MD for PSA / DRE.  [name], MD  [facility] [address] [place], [place] [address] [phone]

## 2019-09-17 DIAGNOSIS — D689 Coagulation defect, unspecified: Secondary | ICD-10-CM

## 2019-09-17 HISTORY — DX: Coagulation defect, unspecified: D68.9

## 2019-09-20 ENCOUNTER — Encounter: Payer: Self-pay | Admitting: Family Medicine

## 2019-09-23 NOTE — Telephone Encounter (Signed)
plz call to get an update on leg swelling - if no better, rec UCC eval today if no available slots.

## 2019-09-23 NOTE — Telephone Encounter (Signed)
Spoke with asking for an update on leg pain/swelling.  States today it is much better.  Says he walked 4 mi and would have to stop and stretch often due to cramping feeling in top of calf.   Today he was able to walk 5 mi and only had to stretch couple of times.  I offered OV but pt declines at this time in light of improvement.  Recommended pt seek UC if pain worsens after hours or call to schedule OV.  Pt verbalizes understanding.  Fyi to Dr. Darnell Level.

## 2019-09-24 ENCOUNTER — Encounter: Payer: Self-pay | Admitting: Family Medicine

## 2019-09-24 ENCOUNTER — Other Ambulatory Visit: Payer: Self-pay

## 2019-09-24 ENCOUNTER — Ambulatory Visit
Admission: RE | Admit: 2019-09-24 | Discharge: 2019-09-24 | Disposition: A | Discharge status: Home / Self Care | Payer: PPO | Source: Ambulatory Visit | Attending: Family Medicine | Admitting: Family Medicine

## 2019-09-24 ENCOUNTER — Ambulatory Visit (INDEPENDENT_AMBULATORY_CARE_PROVIDER_SITE_OTHER): Payer: PPO | Admitting: Family Medicine

## 2019-09-24 ENCOUNTER — Telehealth: Payer: Self-pay

## 2019-09-24 VITALS — BP 122/82 | HR 73 | Temp 97.6°F | Ht 72.0 in | Wt 185.1 lb

## 2019-09-24 DIAGNOSIS — Z1211 Encounter for screening for malignant neoplasm of colon: Secondary | ICD-10-CM

## 2019-09-24 DIAGNOSIS — M7989 Other specified soft tissue disorders: Secondary | ICD-10-CM

## 2019-09-24 DIAGNOSIS — M79662 Pain in left lower leg: Secondary | ICD-10-CM

## 2019-09-24 DIAGNOSIS — I82432 Acute embolism and thrombosis of left popliteal vein: Secondary | ICD-10-CM | POA: Diagnosis not present

## 2019-09-24 MED ORDER — APIXABAN 5 MG PO TABS: 5.0000 mg | ORAL_TABLET | Freq: Two times a day (BID) | ORAL | 6 refills | Status: DC

## 2019-09-24 NOTE — Patient Instructions (Signed)
For left leg pain and swelling I do recommend checking venous ultrasound today - see Rosaria Ferries on your way out to get this scheduled.

## 2019-09-24 NOTE — Telephone Encounter (Signed)
Call report came in from [place] at [facility]. Left lower extremity venous doppler US is positive for DVT.  Dr. Darnell Level made aware.

## 2019-09-24 NOTE — Telephone Encounter (Signed)
Spoke with patient Start eliquis See result note.

## 2019-09-24 NOTE — Progress Notes (Signed)
This visit was conducted in person.  BP 122/82 (BP Location: Left Arm, Patient Position: Sitting, Cuff Size: Normal)   Pulse 73   Temp 97.6 F (36.4 C) (Temporal)   Ht 6' (1.829 m)   Wt 185 lb 1 oz (83.9 kg)   SpO2 97%   BMI 25.10 kg/m    CC: leg pain Subjective:    Patient ID: William Shelton, male    DOB: Mar 14, 1951, 69 y.o.   MRN: [id number]  HPI: William Shelton is a 69 y.o. male presenting on 09/24/2019 for Leg Pain (C/o left leg pain from hip to low leg.  Feels like cramping.  Also, had some swelling.  Started about 5 days ago. )   4-5 d h/o L calf pain/cramping, tender to touch. Possible mild swelling of calf. Does better with stretching. Denies inciting trauma/injury or falls. Denies log car or plane rides, no hormone use, no fmhx or personal history of blood clots. No fevers/chills, back pain. Over last 24 hours noticing pain radiating up posterior thigh to hip  He continues walking 3-6 miles daily - had to decrease distance for a few days due to calf pain.      Relevant past medical, surgical, family and social history reviewed and updated as indicated. Interim medical history since our last visit reviewed. Allergies and medications reviewed and updated. Outpatient Medications Prior to Visit  Medication Sig Dispense Refill  . amLODipine (NORVASC) 10 MG tablet TAKE 1 TABLET BY MOUTH EVERY DAY 90 tablet 3  . Cholecalciferol (VITAMIN D) 2000 units CAPS Take 1 capsule (2,000 Units total) by mouth daily. 30 capsule   . EPINEPHrine 0.3 mg/0.3 mL IJ SOAJ injection Inject 0.3 mLs (0.3 mg total) into the muscle once. (Patient taking differently: Inject 0.3 mg into the muscle once. As needed) 1 Device 1  . Multiple Vitamin (MULTIVITAMIN) capsule Take 1 capsule by mouth daily.    . Omega-3 Fatty Acids (FISH OIL PO) Take 1 capsule by mouth daily.     No facility-administered medications prior to visit.     Per HPI unless specifically indicated in ROS section below Review of  Systems Objective:    BP 122/82 (BP Location: Left Arm, Patient Position: Sitting, Cuff Size: Normal)   Pulse 73   Temp 97.6 F (36.4 C) (Temporal)   Ht 6' (1.829 m)   Wt 185 lb 1 oz (83.9 kg)   SpO2 97%   BMI 25.10 kg/m   Wt Readings from Last 3 Encounters:  09/24/19 185 lb 1 oz (83.9 kg)  09/01/19 180 lb (81.6 kg)  11/10/18 175 lb 9 oz (79.6 kg)    Physical Exam Vitals and nursing note reviewed.  Constitutional:      Appearance: Normal appearance. He is not ill-appearing.  Musculoskeletal:        General: Swelling present. Normal range of motion.     Right lower leg: No edema.     Left lower leg: No edema.     Comments:  2+ DP bilaterally Neg SLR bilaterally. No pain with int/ext rotation at hip. No pain at SIJ, GTB or sciatic notch bilaterally.  R calf circ 38cm L calf circ 39cm  Skin:    General: Skin is warm and dry.     Findings: No erythema or rash.  Neurological:     Mental Status: He is alert.  Psychiatric:        Mood and Affect: Mood normal.        Behavior:  Behavior normal.       Results for orders placed or performed in visit on 08/30/19  PSA  Result Value Ref Range   Prostate Specific Ag, Serum 6.1 (H) 0.0 - 4.0 ng/mL   Assessment & Plan:  This visit occurred during the SARS-CoV-2 public health emergency.  Safety protocols were in place, including screening questions prior to the visit, additional usage of staff PPE, and extensive cleaning of exam room while observing appropriate contact time as indicated for disinfecting solutions.   Problem List Items Addressed This Visit    Pain and swelling of left lower leg - Primary    Calf strain, r/o DVT - will send for [place] today as it's Friday and there's possibility of snow. Pt agrees with plan.       Relevant Orders   US Venous Img Lower Unilateral Left       No orders of the defined types were placed in this encounter.  Orders Placed This Encounter  Procedures  . US Venous Img Lower Unilateral  Left    Standing Status:   Future    Standing Expiration Date:   11/21/2020    Order Specific Question:   Reason for Exam (SYMPTOM  OR DIAGNOSIS REQUIRED)    Answer:   L calf pain and swelling    Order Specific Question:   Preferred imaging location?    Answer:   [place]    Patient Instructions  For left leg pain and swelling I do recommend checking venous ultrasound today - see Rosaria Ferries on your way out to get this scheduled.    Follow up plan: Return if symptoms worsen or fail to improve.  Ria Bush, MD

## 2019-09-24 NOTE — Assessment & Plan Note (Signed)
Calf strain, r/o DVT - will send for [place] today as it's Friday and there's possibility of snow. Pt agrees with plan.

## 2019-09-25 ENCOUNTER — Encounter: Payer: Self-pay | Admitting: Family Medicine

## 2019-09-25 NOTE — Addendum Note (Signed)
Addended by: Ria Bush on: 09/25/2019 12:35 PM   Modules accepted: Orders

## 2019-09-25 NOTE — Telephone Encounter (Addendum)
LLE venous US 09/2019 - age-indeterminate occlusive DVT involving the left popliteal vein extending to involve both paired left posterior tibial veins.  Unprovoked as far as I can tell.  Will ask him to complete iFOB as overdue for colon cancer screening and will likely need to postpone colonoscopy due to recent eliquis commencement, will ask him to return for CPE for labs and further eval of chronic eosinophilia.  Closely monitored by urology for elevated PSA. Will cc Dr Erlene Quan regarding new DVT as fyi.

## 2019-09-25 NOTE — Telephone Encounter (Signed)
William Shelton, plz mail patient iFOB kit.

## 2019-09-27 ENCOUNTER — Encounter: Payer: Self-pay | Admitting: Family Medicine

## 2019-09-27 NOTE — Telephone Encounter (Signed)
Noted.  Kit mailed.

## 2019-09-28 NOTE — Telephone Encounter (Addendum)
Looks like pt scheduled for AWV on 10/01/19 at 8:15.

## 2019-09-29 ENCOUNTER — Other Ambulatory Visit: Payer: Self-pay | Admitting: Family Medicine

## 2019-09-29 ENCOUNTER — Other Ambulatory Visit: Payer: Self-pay

## 2019-09-29 ENCOUNTER — Other Ambulatory Visit (INDEPENDENT_AMBULATORY_CARE_PROVIDER_SITE_OTHER): Payer: PPO

## 2019-09-29 DIAGNOSIS — E559 Vitamin D deficiency, unspecified: Secondary | ICD-10-CM | POA: Diagnosis not present

## 2019-09-29 DIAGNOSIS — R972 Elevated prostate specific antigen [PSA]: Secondary | ICD-10-CM

## 2019-09-29 DIAGNOSIS — E785 Hyperlipidemia, unspecified: Secondary | ICD-10-CM | POA: Diagnosis not present

## 2019-09-29 DIAGNOSIS — N1831 Chronic kidney disease, stage 3a: Secondary | ICD-10-CM | POA: Diagnosis not present

## 2019-09-29 DIAGNOSIS — D7219 Other eosinophilia: Secondary | ICD-10-CM

## 2019-09-29 LAB — COMPREHENSIVE METABOLIC PANEL
ALT: 19 U/L (ref 0–53)
AST: 20 U/L (ref 0–37)
Albumin: 4.2 g/dL (ref 3.5–5.2)
Alkaline Phosphatase: 106 U/L (ref 39–117)
BUN: 26 mg/dL — ABNORMAL HIGH (ref 6–23)
CO2: 30 mEq/L (ref 19–32)
Calcium: 9.5 mg/dL (ref 8.4–10.5)
Chloride: 104 mEq/L (ref 96–112)
Creatinine, Ser: 1.21 mg/dL (ref 0.40–1.50)
GFR: 59.37 mL/min — ABNORMAL LOW (ref >60.00)
Glucose, Bld: 98 mg/dL (ref 70–99)
Potassium: 4.6 mEq/L (ref 3.5–5.1)
Sodium: 140 mEq/L (ref 135–145)
Total Bilirubin: 0.5 mg/dL (ref 0.2–1.2)
Total Protein: 6.9 g/dL (ref 6.0–8.3)

## 2019-09-29 LAB — MICROALBUMIN / CREATININE URINE RATIO
Creatinine,U: 166.1 mg/dL
Microalb Creat Ratio: 5.8 mg/g (ref 0.0–30.0)
Microalb, Ur: 9.7 mg/dL — ABNORMAL HIGH (ref 0.0–1.9)

## 2019-09-29 LAB — LIPID PANEL
Cholesterol: 211 mg/dL — ABNORMAL HIGH (ref 0–200)
HDL: 49.1 mg/dL (ref >39.00)
LDL Cholesterol: 137 mg/dL — ABNORMAL HIGH (ref 0–99)
NonHDL: 161.85
Total CHOL/HDL Ratio: 4
Triglycerides: 125 mg/dL (ref 0.0–149.0)
VLDL: 25 mg/dL (ref 0.0–40.0)

## 2019-09-29 LAB — VITAMIN D 25 HYDROXY (VIT D DEFICIENCY, FRACTURES): VITD: 33.09 ng/mL (ref 30.00–100.00)

## 2019-09-29 NOTE — Addendum Note (Signed)
Addended by: Ellamae Sia on: 09/29/2019 11:36 AM   Modules accepted: Orders

## 2019-09-29 NOTE — Addendum Note (Signed)
Addended by: Ellamae Sia on: 09/29/2019 09:21 AM   Modules accepted: Orders

## 2019-09-29 NOTE — Addendum Note (Signed)
Addended by: Ellamae Sia on: 09/29/2019 08:53 AM   Modules accepted: Orders

## 2019-09-30 LAB — CBC WITH DIFFERENTIAL/PLATELET
Absolute Monocytes: 933 cells/uL (ref 200–950)
Basophils Absolute: 62 cells/uL (ref 0–200)
Basophils Relative: 0.7 %
Eosinophils Absolute: 449 cells/uL (ref 15–500)
Eosinophils Relative: 5.1 %
HCT: 43 % (ref 38.5–50.0)
Hemoglobin: 14.8 g/dL (ref 13.2–17.1)
Lymphs Abs: 2482 cells/uL (ref 850–3900)
MCH: 30.7 pg (ref 27.0–33.0)
MCHC: 34.4 g/dL (ref 32.0–36.0)
MCV: 89.2 fL (ref 80.0–100.0)
MPV: 10.1 fL (ref 7.5–12.5)
Monocytes Relative: 10.6 %
Neutro Abs: 4875 cells/uL (ref 1500–7800)
Neutrophils Relative %: 55.4 %
Platelets: 279 10*3/uL (ref 140–400)
RBC: 4.82 10*6/uL (ref 4.20–5.80)
RDW: 12.4 % (ref 11.0–15.0)
Total Lymphocyte: 28.2 %
WBC: 8.8 10*3/uL (ref 3.8–10.8)

## 2019-09-30 LAB — PATHOLOGIST SMEAR REVIEW

## 2019-10-01 ENCOUNTER — Ambulatory Visit (INDEPENDENT_AMBULATORY_CARE_PROVIDER_SITE_OTHER): Payer: PPO | Admitting: Family Medicine

## 2019-10-01 ENCOUNTER — Other Ambulatory Visit (INDEPENDENT_AMBULATORY_CARE_PROVIDER_SITE_OTHER): Payer: PPO

## 2019-10-01 ENCOUNTER — Other Ambulatory Visit: Payer: Self-pay

## 2019-10-01 ENCOUNTER — Encounter: Payer: Self-pay | Admitting: Family Medicine

## 2019-10-01 VITALS — BP 120/80 | HR 75 | Temp 97.8°F | Ht 72.0 in | Wt 180.3 lb

## 2019-10-01 DIAGNOSIS — Z1211 Encounter for screening for malignant neoplasm of colon: Secondary | ICD-10-CM

## 2019-10-01 DIAGNOSIS — D7219 Other eosinophilia: Secondary | ICD-10-CM

## 2019-10-01 DIAGNOSIS — I82432 Acute embolism and thrombosis of left popliteal vein: Secondary | ICD-10-CM | POA: Diagnosis not present

## 2019-10-01 DIAGNOSIS — Z7189 Other specified counseling: Secondary | ICD-10-CM | POA: Diagnosis not present

## 2019-10-01 DIAGNOSIS — Z0000 Encounter for general adult medical examination without abnormal findings: Secondary | ICD-10-CM

## 2019-10-01 DIAGNOSIS — N1831 Chronic kidney disease, stage 3a: Secondary | ICD-10-CM | POA: Diagnosis not present

## 2019-10-01 DIAGNOSIS — E559 Vitamin D deficiency, unspecified: Secondary | ICD-10-CM | POA: Diagnosis not present

## 2019-10-01 DIAGNOSIS — E785 Hyperlipidemia, unspecified: Secondary | ICD-10-CM | POA: Diagnosis not present

## 2019-10-01 DIAGNOSIS — R972 Elevated prostate specific antigen [PSA]: Secondary | ICD-10-CM | POA: Diagnosis not present

## 2019-10-01 DIAGNOSIS — I10 Essential (primary) hypertension: Secondary | ICD-10-CM

## 2019-10-01 LAB — FECAL OCCULT BLOOD, IMMUNOCHEMICAL: Fecal Occult Bld: NEGATIVE

## 2019-10-01 LAB — FECAL OCCULT BLOOD, GUAIAC: Fecal Occult Blood: NEGATIVE

## 2019-10-01 NOTE — Assessment & Plan Note (Signed)
Improvement noted. Continue good water intake.

## 2019-10-01 NOTE — Progress Notes (Signed)
This visit was conducted in person.  BP 120/80 (BP Location: Left Arm, Patient Position: Sitting, Cuff Size: Normal)   Pulse 75   Temp 97.8 F (36.6 C) (Temporal)   Ht 6' (1.829 m)   Wt 180 lb 5 oz (81.8 kg)   SpO2 97%   BMI 24.45 kg/m    CC: CPE Subjective:    Patient ID: William Shelton, male    DOB: 27-Dec-1950, 69 y.o.   MRN: [id number]  HPI: William Shelton is a 69 y.o. male presenting on 10/01/2019 for Medicare Wellness   Did not see health advisor this year.  Last wellness visit 10/2018.  He checked with insurance, told could have 1 physical per calendar year.     Hearing Screening   125Hz  250Hz  500Hz  1000Hz  2000Hz  3000Hz  4000Hz  6000Hz  8000Hz   Right ear:   20 25 40  0    Left ear:   25 40 40  0    Vision Screening Comments: Last eye exam, Apr or May 2020.    Office Visit from 10/01/2019 in [facility] [place]  PHQ-2 Total Score  0      Fall Risk  10/01/2019 11/10/2018 11/05/2017 10/31/2016 10/12/2015  Falls in the past year? 0 0 No No No      LLE venous US 09/2019 - age-indeterminate occlusive DVT involving the left popliteal vein extending to involve both paired left posterior tibial veins. Started on eliquis 5mg  bid 09/24/2019.   He notes that 1-2 yrs ago had some left leg swelling that limited bending - which got better on its own.   Preventative: COLONOSCOPY Date: 07/12/08 Internal hemorrhoids [facility] Plan). iFOB returned today.  Prostate cancer screening -elevated PSAfollowed by Dr Erlene Quan. h/o low risk velocity.Biopsy normal 12/2014.ProstateMRIreassuring 10/2016. + fmhx father with prostate cancer dx age 27s, controlled with seeds.  Lung cancer screening - not eligible Flu shot yearly Prevnar1/2017, pneumovax2018 Td 2009  zostavax - 07/2014 shingrix - discussed  Advanced directive: has at home. HCPOA would be wife. Asked to bring copy to update chart. Seat belt use discussed Sunscreen use discussed. No changing moles on  skin Nonsmoker Alcohol - rare  Dentist q6 mo  Eye exam yearly  Bowel - no constipation Bladder - no incontinence  Married; lives with wife  1 son  Retired Microbiologist  Activity: walks daily5-10 milesdaily, minimum 3 miles. Diet: good water, fruits/vegetables daily     Relevant past medical, surgical, family and social history reviewed and updated as indicated. Interim medical history since our last visit reviewed. Allergies and medications reviewed and updated. Outpatient Medications Prior to Visit  Medication Sig Dispense Refill  . amLODipine (NORVASC) 10 MG tablet TAKE 1 TABLET BY MOUTH EVERY DAY 90 tablet 3  . apixaban (ELIQUIS) 5 MG TABS tablet Take 1 tablet (5 mg total) by mouth 2 (two) times daily. 60 tablet 6  . Cholecalciferol (VITAMIN D) 2000 units CAPS Take 1 capsule (2,000 Units total) by mouth daily. 30 capsule   . EPINEPHrine 0.3 mg/0.3 mL IJ SOAJ injection Inject 0.3 mLs (0.3 mg total) into the muscle once. (Patient taking differently: Inject 0.3 mg into the muscle once. As needed) 1 Device 1  . Multiple Vitamin (MULTIVITAMIN) capsule Take 1 capsule by mouth daily.    . Omega-3 Fatty Acids (FISH OIL PO) Take 1 capsule by mouth daily.     No facility-administered medications prior to visit.     Per HPI unless specifically indicated in ROS  section below Review of Systems  Constitutional: Negative for activity change, appetite change, chills, fatigue, fever and unexpected weight change.  HENT: Negative for hearing loss.   Eyes: Negative for visual disturbance.  Respiratory: Positive for cough (mild). Negative for chest tightness, shortness of breath and wheezing.   Cardiovascular: Positive for leg swelling (left sided). Negative for chest pain and palpitations.  Gastrointestinal: Negative for abdominal distention, abdominal pain, blood in stool, constipation, diarrhea, nausea and vomiting.   Genitourinary: Negative for difficulty urinating and hematuria.  Musculoskeletal: Negative for arthralgias, myalgias and neck pain.  Skin: Negative for rash.  Neurological: Positive for headaches (with eliquis). Negative for dizziness, seizures and syncope.  Hematological: Negative for adenopathy. Does not bruise/bleed easily.  Psychiatric/Behavioral: Negative for dysphoric mood. The patient is not nervous/anxious.    Objective:    BP 120/80 (BP Location: Left Arm, Patient Position: Sitting, Cuff Size: Normal)   Pulse 75   Temp 97.8 F (36.6 C) (Temporal)   Ht 6' (1.829 m)   Wt 180 lb 5 oz (81.8 kg)   SpO2 97%   BMI 24.45 kg/m   Wt Readings from Last 3 Encounters:  10/01/19 180 lb 5 oz (81.8 kg)  09/24/19 185 lb 1 oz (83.9 kg)  09/01/19 180 lb (81.6 kg)    Physical Exam Vitals and nursing note reviewed.  Constitutional:      General: He is not in acute distress.    Appearance: Normal appearance. He is well-developed. He is not ill-appearing.  HENT:     Head: Normocephalic and atraumatic.     Right Ear: Hearing, tympanic membrane, ear canal and external ear normal.     Left Ear: Hearing, tympanic membrane, ear canal and external ear normal.     Mouth/Throat:     Pharynx: Uvula midline.  Eyes:     General: No scleral icterus.    Extraocular Movements: Extraocular movements intact.     Conjunctiva/sclera: Conjunctivae normal.     Pupils: Pupils are equal, round, and reactive to light.  Cardiovascular:     Rate and Rhythm: Normal rate and regular rhythm.     Pulses: Normal pulses.          Radial pulses are 2+ on the right side and 2+ on the left side.     Heart sounds: Normal heart sounds. No murmur.  Pulmonary:     Effort: Pulmonary effort is normal. No respiratory distress.     Breath sounds: Normal breath sounds. No wheezing, rhonchi or rales.  Abdominal:     General: Abdomen is flat. Bowel sounds are normal. There is no distension.     Palpations: Abdomen is soft.  There is no mass.     Tenderness: There is no abdominal tenderness. There is no guarding or rebound.     Hernia: No hernia is present.  Musculoskeletal:        General: Normal range of motion.     Cervical back: Normal range of motion and neck supple.     Right lower leg: No edema.     Left lower leg: Edema (1+, tender) present.  Lymphadenopathy:     Cervical: No cervical adenopathy.  Skin:    General: Skin is warm and dry.     Findings: No rash.  Neurological:     General: No focal deficit present.     Mental Status: He is alert and oriented to person, place, and time.     Comments: CN grossly intact, station and gait intact  Psychiatric:        Mood and Affect: Mood normal.        Behavior: Behavior normal.        Thought Content: Thought content normal.        Judgment: Judgment normal.       Results for orders placed or performed in visit on 09/29/19  CBC with Differential  Result Value Ref Range   WBC 8.8 3.8 - 10.8 Thousand/uL   RBC 4.82 4.20 - 5.80 Million/uL   Hemoglobin 14.8 13.2 - 17.1 g/dL   HCT 43.0 38.5 - 50.0 %   MCV 89.2 80.0 - 100.0 fL   MCH 30.7 27.0 - 33.0 pg   MCHC 34.4 32.0 - 36.0 g/dL   RDW 12.4 11.0 - 15.0 %   Platelets 279 140 - 400 Thousand/uL   MPV 10.1 7.5 - 12.5 fL   Neutro Abs 4,875 1,500 - 7,800 cells/uL   Lymphs Abs 2,482 850 - 3,900 cells/uL   Absolute Monocytes 933 200 - 950 cells/uL   Eosinophils Absolute 449 15 - 500 cells/uL   Basophils Absolute 62 0 - 200 cells/uL   Neutrophils Relative % 55.4 %   Total Lymphocyte 28.2 %   Monocytes Relative 10.6 %   Eosinophils Relative 5.1 %   Basophils Relative 0.7 %  peripheral smear  Result Value Ref Range   Path Review    vit d  Result Value Ref Range   VITD 33.09 30.00 - 100.00 ng/mL  Lipid panel  Result Value Ref Range   Cholesterol 211 (H) 0 - 200 mg/dL   Triglycerides 125.0 0.0 - 149.0 mg/dL   HDL 49.10 >39.00 mg/dL   VLDL 25.0 0.0 - 40.0 mg/dL   LDL Cholesterol 137 (H) 0 - 99  mg/dL   Total CHOL/HDL Ratio 4    NonHDL 161.85   Comprehensive metabolic panel  Result Value Ref Range   Sodium 140 135 - 145 mEq/L   Potassium 4.6 3.5 - 5.1 mEq/L   Chloride 104 96 - 112 mEq/L   CO2 30 19 - 32 mEq/L   Glucose, Bld 98 70 - 99 mg/dL   BUN 26 (H) 6 - 23 mg/dL   Creatinine, Ser 1.21 0.40 - 1.50 mg/dL   Total Bilirubin 0.5 0.2 - 1.2 mg/dL   Alkaline Phosphatase 106 39 - 117 U/L   AST 20 0 - 37 U/L   ALT 19 0 - 53 U/L   Total Protein 6.9 6.0 - 8.3 g/dL   Albumin 4.2 3.5 - 5.2 g/dL   GFR 59.37 (L) >60.00 mL/min   Calcium 9.5 8.4 - 10.5 mg/dL  Microalbumin / creatinine urine ratio  Result Value Ref Range   Microalb, Ur 9.7 (H) 0.0 - 1.9 mg/dL   Creatinine,U 166.1 mg/dL   Microalb Creat Ratio 5.8 0.0 - 30.0 mg/g   Assessment & Plan:  This visit occurred during the SARS-CoV-2 public health emergency.  Safety protocols were in place, including screening questions prior to the visit, additional usage of staff PPE, and extensive cleaning of exam room while observing appropriate contact time as indicated for disinfecting solutions.   Problem List Items Addressed This Visit    Vitamin D deficiency    Continue vit D replacement.       HLD (hyperlipidemia)    Chronic, stable. Continue fish oil. Consider statin discussion.  The 10-year ASCVD risk score Mikey Bussing DC Jr., et al., 2013) is: 18.2%   Values used to calculate the score:  Age: 62 years     Sex: Male     Is Non-Hispanic African American: No     Diabetic: No     Tobacco smoker: No     Systolic Blood Pressure: 123456 mmHg     Is BP treated: Yes     HDL Cholesterol: 49.1 mg/dL     Total Cholesterol: 211 mg/dL       Health maintenance examination - Primary    Preventative protocols reviewed and updated unless pt declined. Discussed healthy diet and lifestyle.       Essential hypertension    Chronic, stable. Continue amlodipine.       Eosinophilia    In h/o allergic rhinitis. Levels actually improved. Now  with new DVT, recommend heme eval.       Relevant Orders   Ambulatory referral to Hematology   Elevated prostate specific antigen (PSA)    Appreciate uro care. Has had biopsy and MRI. Monitoring with Q54mo PSA      Deep vein thrombosis (DVT) of popliteal vein of left lower extremity ([place])    Reviewed venous US results. Age-indeterminate. ?unprovoked. In h/o eosinophilia, suggested heme eval for recs on duration of therapy and to evaluate eosinophilia.       Relevant Orders   Ambulatory referral to Hematology   Chronic kidney disease (CKD), stage III (moderate)    Improvement noted. Continue good water intake.       Advanced care planning/counseling discussion    Advanced directive: has at home. HCPOA would be wife. Asked to bring copy to update chart.          No orders of the defined types were placed in this encounter.  Orders Placed This Encounter  Procedures  . Ambulatory referral to Hematology    Referral Priority:   Routine    Referral Type:   Consultation    Referral Reason:   Specialty Services Required    Requested Specialty:   Oncology    Number of Visits Requested:   1    Patient instructions: If interested, check with pharmacy about new 2 shot shingles series (shingrix).  Continue eliquis. We will refer you to hematology for further evaluation.  Bring [place] a copy of your living will.  You are doing well today - watch for easy bleeding on eliquis.  Return as needed or in 3-6 months for follow up.   Follow up plan: Return in about 6 months (around 03/30/2020) for follow up visit.  Ria Bush, MD

## 2019-10-01 NOTE — Assessment & Plan Note (Signed)
Advanced directive: has at home. HCPOA would be wife. Asked to bring copy to update chart. 

## 2019-10-01 NOTE — Assessment & Plan Note (Signed)
Preventative protocols reviewed and updated unless pt declined. Discussed healthy diet and lifestyle.  

## 2019-10-01 NOTE — Assessment & Plan Note (Addendum)
Appreciate uro care. Has had biopsy and MRI. Monitoring with Q12mo PSA

## 2019-10-01 NOTE — Assessment & Plan Note (Signed)
In h/o allergic rhinitis. Levels actually improved. Now with new DVT, recommend heme eval.

## 2019-10-01 NOTE — Assessment & Plan Note (Signed)
Reviewed venous US results. Age-indeterminate. ?unprovoked. In h/o eosinophilia, suggested heme eval for recs on duration of therapy and to evaluate eosinophilia.

## 2019-10-01 NOTE — Assessment & Plan Note (Signed)
Chronic, stable. Continue amlodipine.  

## 2019-10-01 NOTE — Patient Instructions (Addendum)
If interested, check with pharmacy about new 2 shot shingles series (shingrix).  Continue eliquis. We will refer you to hematology for further evaluation.  Bring [place] a copy of your living will.  You are doing well today - watch for easy bleeding on eliquis.  Return as needed or in 3-6 months for follow up.   Health Maintenance After Age 70 After age 74, you are at a higher risk for certain long-term diseases and infections as well as injuries from falls. Falls are a major cause of broken bones and head injuries in people who are older than age 22. Getting regular preventive care can help to keep you healthy and well. Preventive care includes getting regular testing and making lifestyle changes as recommended by your health care provider. Talk with your health care provider about:  Which screenings and tests you should have. A screening is a test that checks for a disease when you have no symptoms.  A diet and exercise plan that is right for you. What should I know about screenings and tests to prevent falls? Screening and testing are the best ways to find a health problem early. Early diagnosis and treatment give you the best chance of managing medical conditions that are common after age 60. Certain conditions and lifestyle choices may make you more likely to have a fall. Your health care provider may recommend:  Regular vision checks. Poor vision and conditions such as cataracts can make you more likely to have a fall. If you wear glasses, make sure to get your prescription updated if your vision changes.  Medicine review. Work with your health care provider to regularly review all of the medicines you are taking, including over-the-counter medicines. Ask your health care provider about any side effects that may make you more likely to have a fall. Tell your health care provider if any medicines that you take make you feel dizzy or sleepy.  Osteoporosis screening. Osteoporosis is a condition that  causes the bones to get weaker. This can make the bones weak and cause them to break more easily.  Blood pressure screening. Blood pressure changes and medicines to control blood pressure can make you feel dizzy.  Strength and balance checks. Your health care provider may recommend certain tests to check your strength and balance while standing, walking, or changing positions.  Foot health exam. Foot pain and numbness, as well as not wearing proper footwear, can make you more likely to have a fall.  Depression screening. You may be more likely to have a fall if you have a fear of falling, feel emotionally low, or feel unable to do activities that you used to do.  Alcohol use screening. Using too much alcohol can affect your balance and may make you more likely to have a fall. What actions can I take to lower my risk of falls? General instructions  Talk with your health care provider about your risks for falling. Tell your health care provider if: ? You fall. Be sure to tell your health care provider about all falls, even ones that seem minor. ? You feel dizzy, sleepy, or off-balance.  Take over-the-counter and prescription medicines only as told by your health care provider. These include any supplements.  Eat a healthy diet and maintain a healthy weight. A healthy diet includes low-fat dairy products, low-fat (lean) meats, and fiber from whole grains, beans, and lots of fruits and vegetables. Home safety  Remove any tripping hazards, such as rugs, cords, and clutter.  Install safety equipment such as grab bars in bathrooms and safety rails on stairs.  Keep rooms and walkways well-lit. Activity   Follow a regular exercise program to stay fit. This will help you maintain your balance. Ask your health care provider what types of exercise are appropriate for you.  If you need a cane or walker, use it as recommended by your health care provider.  Wear supportive shoes that have nonskid  soles. Lifestyle  Do not drink alcohol if your health care provider tells you not to drink.  If you drink alcohol, limit how much you have: ? 0-1 drink a day for women. ? 0-2 drinks a day for men.  Be aware of how much alcohol is in your drink. In the [place] one drink equals one typical bottle of beer (12 oz), one-half glass of wine (5 oz), or one shot of hard liquor (1 oz).  Do not use any products that contain nicotine or tobacco, such as cigarettes and e-cigarettes. If you need help quitting, ask your health care provider. Summary  Having a healthy lifestyle and getting preventive care can help to protect your health and wellness after age 6.  Screening and testing are the best way to find a health problem early and help you avoid having a fall. Early diagnosis and treatment give you the best chance for managing medical conditions that are more common for people who are older than age 55.  Falls are a major cause of broken bones and head injuries in people who are older than age 30. Take precautions to prevent a fall at home.  Work with your health care provider to learn what changes you can make to improve your health and wellness and to prevent falls. This information is not intended to replace advice given to you by your health care provider. Make sure you discuss any questions you have with your health care provider. Document Revised: 12/24/2018 Document Reviewed: 07/16/2017 Elsevier Patient Education  2020 [employer].

## 2019-10-01 NOTE — Assessment & Plan Note (Signed)
Chronic, stable. Continue fish oil. Consider statin discussion.  The 10-year ASCVD risk score Mikey Bussing DC Brooke Bonito., et al., 2013) is: 18.2%   Values used to calculate the score:     Age: 69 years     Sex: Male     Is Non-Hispanic African American: No     Diabetic: No     Tobacco smoker: No     Systolic Blood Pressure: 123456 mmHg     Is BP treated: Yes     HDL Cholesterol: 49.1 mg/dL     Total Cholesterol: 211 mg/dL

## 2019-10-01 NOTE — Assessment & Plan Note (Signed)
Continue vit D replacement.  

## 2019-10-05 ENCOUNTER — Encounter: Payer: Self-pay | Admitting: Family Medicine

## 2019-10-06 ENCOUNTER — Inpatient Hospital Stay: Payer: PPO | Attending: Internal Medicine | Admitting: Internal Medicine

## 2019-10-06 ENCOUNTER — Other Ambulatory Visit: Payer: Self-pay

## 2019-10-06 ENCOUNTER — Encounter: Payer: Self-pay | Admitting: Internal Medicine

## 2019-10-06 DIAGNOSIS — N400 Enlarged prostate without lower urinary tract symptoms: Secondary | ICD-10-CM | POA: Diagnosis not present

## 2019-10-06 DIAGNOSIS — K589 Irritable bowel syndrome without diarrhea: Secondary | ICD-10-CM | POA: Diagnosis not present

## 2019-10-06 DIAGNOSIS — M129 Arthropathy, unspecified: Secondary | ICD-10-CM | POA: Diagnosis not present

## 2019-10-06 DIAGNOSIS — Z803 Family history of malignant neoplasm of breast: Secondary | ICD-10-CM | POA: Insufficient documentation

## 2019-10-06 DIAGNOSIS — I82432 Acute embolism and thrombosis of left popliteal vein: Secondary | ICD-10-CM

## 2019-10-06 DIAGNOSIS — E875 Hyperkalemia: Secondary | ICD-10-CM | POA: Insufficient documentation

## 2019-10-06 DIAGNOSIS — N529 Male erectile dysfunction, unspecified: Secondary | ICD-10-CM | POA: Diagnosis not present

## 2019-10-06 DIAGNOSIS — Z8042 Family history of malignant neoplasm of prostate: Secondary | ICD-10-CM | POA: Diagnosis not present

## 2019-10-06 DIAGNOSIS — N183 Chronic kidney disease, stage 3 unspecified: Secondary | ICD-10-CM | POA: Diagnosis not present

## 2019-10-06 DIAGNOSIS — Z79899 Other long term (current) drug therapy: Secondary | ICD-10-CM | POA: Diagnosis not present

## 2019-10-06 DIAGNOSIS — I129 Hypertensive chronic kidney disease with stage 1 through stage 4 chronic kidney disease, or unspecified chronic kidney disease: Secondary | ICD-10-CM | POA: Insufficient documentation

## 2019-10-06 DIAGNOSIS — Z7901 Long term (current) use of anticoagulants: Secondary | ICD-10-CM | POA: Diagnosis not present

## 2019-10-06 DIAGNOSIS — Z800 Family history of malignant neoplasm of digestive organs: Secondary | ICD-10-CM | POA: Diagnosis not present

## 2019-10-06 DIAGNOSIS — Z86711 Personal history of pulmonary embolism: Secondary | ICD-10-CM | POA: Diagnosis not present

## 2019-10-06 DIAGNOSIS — Z86718 Personal history of other venous thrombosis and embolism: Secondary | ICD-10-CM | POA: Diagnosis not present

## 2019-10-06 NOTE — Assessment & Plan Note (Addendum)
#   LEFT POPLITEAL VEIN DVT-ultrasound January 2021-occlusive DVT left popliteal vein extending into left posterior tibial veins-age-indeterminate.  Unprovoked-no identifiable cause noted.  #Clinically appears acute-given the onset of symptoms.  Agree with proceeding with anticoagulation for 3 months.  Patient to follow-up with PCP regarding continued prescription for anticoagulation with Eliquis.  #Discussed that would recommend further work-up hypercoagulable work-up/D-dimer at next visit.  Discussed that this is however unlikely to influence patient's current duration of anticoagulation.  Discussed that we also consider ultrasound for follow-up.  Thank you Dr.Gutierrez for allowing me to participate in the care of your pleasant patient. Please do not hesitate to contact me with questions or concerns in the interim.  # DISPOSITION: # 1st week of may 2021; 10 days prior labs- cbc-Dr.B   Cc; Dr.G

## 2019-10-06 NOTE — Progress Notes (Signed)
[facility] NOTE  Patient Care Team: Ria Bush, MD as PCP - General (Family Medicine)  CHIEF COMPLAINTS/PURPOSE OF CONSULTATION: DVT/PE  # LEFT POPLITEAL DVT- [jan 3rd 2021]-occlusive DVT of the left popliteal vein/involving posterior tibial veins-age indeterminate; Eliquis.  #  Oncology History   No history exists.     HISTORY OF PRESENTING ILLNESS:  William Shelton 69 y.o.  male has been referred to [place] for further evaluation recommendations for left lower extremity DVT.  Patient states that noted to have significant pain/cramping in the left leg in January 2021-which was further evaluated with an ultrasound that showed DVT as above.  Patient has been on Eliquis since then.  He has been tolerating well.  No blood in stools black or stools but no easy bruising.  Interestingly patient noted to have calf pain in the left leg approximately 2 years ago.  Patient had intermittent soreness but no pain.  He did not have that worked up/or had any treatment.    With regards risk factors: Long distance travel- none Immobilization/trauma: none Previous history of DVT/PE: none Family history: none  Review of Systems  Constitutional: Negative for chills, diaphoresis, fever, malaise/fatigue and weight loss.  HENT: Negative for nosebleeds and sore throat.   Eyes: Negative for double vision.  Respiratory: Negative for cough, hemoptysis, sputum production, shortness of breath and wheezing.   Cardiovascular: Negative for chest pain, palpitations, orthopnea and leg swelling.  Gastrointestinal: Negative for abdominal pain, blood in stool, constipation, diarrhea, heartburn, melena, nausea and vomiting.  Genitourinary: Negative for dysuria, frequency and urgency.  Musculoskeletal: Positive for myalgias. Negative for back pain and joint pain.  Skin: Negative.  Negative for itching and rash.  Neurological: Negative for dizziness, tingling, focal weakness, weakness and  headaches.  Endo/Heme/Allergies: Does not bruise/bleed easily.  Psychiatric/Behavioral: Negative for depression. The patient is not nervous/anxious and does not have insomnia.      MEDICAL HISTORY:  Past Medical History:  Diagnosis Date  . Allergic rhinitis, cause unspecified   . Arthritis   . BPH (benign prostatic hypertrophy)   . BPV (benign positional vertigo) 09/28/2015  . Chronic kidney disease (CKD), stage III (moderate) 10/22/2011  . ED (erectile dysfunction)   . Elevated prostate specific antigen (PSA) 2016   eval by Dr Erlene Quan - s/p benign biopsy   . Essential hypertension 08/04/2014  . Hematuria, microscopic   . HLD (hyperlipidemia)   . Hydrocele, left   . IBS (irritable bowel syndrome)   . Internal hemorrhoid     SURGICAL HISTORY: Past Surgical History:  Procedure Laterality Date  . COLONOSCOPY  07/12/08   Internal hemorrhoids---Dr. Fuller Plan  . INGUINAL HERNIA REPAIR  07/21/02   Left---Dr. Bary Castilla    SOCIAL HISTORY: Social History   Socioeconomic History  . Marital status: Married    Spouse name: Not on file  . Number of children: 1  . Years of education: Not on file  . Highest education level: Not on file  Occupational History  . Occupation: Retired  Tobacco Use  . Smoking status: Never Smoker  . Smokeless tobacco: Never Used  Substance and Sexual Activity  . Alcohol use: No  . Drug use: No  . Sexual activity: Not on file  Other Topics Concern  . Not on file  Social History Narrative   Lives in [place]. Married; lives with wife; 1 son Retired Surveyor, mining ; Activity: walks daily Diet: good water, fruits/vegetables daily.      No smoking; ocassional  beer.    Social Determinants of Health   Financial Resource Strain:   . Difficulty of Paying Living Expenses: Not on file  Food Insecurity:   . Worried About Charity fundraiser in the Last Year: Not on file  . Ran Out of Food in the Last Year: Not on file  Transportation  Needs:   . Lack of Transportation (Medical): Not on file  . Lack of Transportation (Non-Medical): Not on file  Physical Activity:   . Days of Exercise per Week: Not on file  . Minutes of Exercise per Session: Not on file  Stress:   . Feeling of Stress : Not on file  Social Connections:   . Frequency of Communication with Friends and Family: Not on file  . Frequency of Social Gatherings with Friends and Family: Not on file  . Attends Religious Services: Not on file  . Active Member of Clubs or Organizations: Not on file  . Attends Archivist Meetings: Not on file  . Marital Status: Not on file  Intimate Partner Violence:   . Fear of Current or Ex-Partner: Not on file  . Emotionally Abused: Not on file  . Physically Abused: Not on file  . Sexually Abused: Not on file    FAMILY HISTORY: Family History  Problem Relation Age of Onset  . Hypertension Father   . Prostate cancer Father 56       Radiation seeds  . Macular degeneration Father   . Hyperlipidemia Brother   . Other Brother        BPH  . Diabetes Neg Hx   . Breast cancer Neg Hx   . Colon cancer Neg Hx   . Depression Neg Hx   . Alcohol abuse Neg Hx   . Stroke Neg Hx   . CAD Neg Hx     ALLERGIES:  has No Known Allergies.  MEDICATIONS:  Current Outpatient Medications  Medication Sig Dispense Refill  . amLODipine (NORVASC) 10 MG tablet TAKE 1 TABLET BY MOUTH EVERY DAY 90 tablet 3  . apixaban (ELIQUIS) 5 MG TABS tablet Take 1 tablet (5 mg total) by mouth 2 (two) times daily. 60 tablet 6  . Cholecalciferol (VITAMIN D) 2000 units CAPS Take 1 capsule (2,000 Units total) by mouth daily. 30 capsule   . EPINEPHrine 0.3 mg/0.3 mL IJ SOAJ injection Inject 0.3 mLs (0.3 mg total) into the muscle once. 1 Device 1  . Multiple Vitamin (MULTIVITAMIN) capsule Take 1 capsule by mouth daily.    . Omega-3 Fatty Acids (FISH OIL PO) Take 1 capsule by mouth daily.     No current facility-administered medications for this  visit.      Marland Kitchen  PHYSICAL EXAMINATION:  Vitals:   10/06/19 1400  BP: 120/83  Pulse: 71  Temp: (!) 95.7 F (35.4 C)   Filed Weights   10/06/19 1400  Weight: 183 lb (83 kg)    Physical Exam  Constitutional: He is oriented to person, place, and time and well-developed, well-nourished, and in no distress.  HENT:  Head: Normocephalic and atraumatic.  Mouth/Throat: Oropharynx is clear and moist. No oropharyngeal exudate.  Eyes: Pupils are equal, round, and reactive to light.  Cardiovascular: Normal rate and regular rhythm.  Pulmonary/Chest: No respiratory distress. He has no wheezes.  Abdominal: Soft. Bowel sounds are normal. He exhibits no distension and no mass. There is no abdominal tenderness. There is no rebound and no guarding.  Musculoskeletal:  General: No tenderness or edema. Normal range of motion.     Cervical back: Normal range of motion and neck supple.  Neurological: He is alert and oriented to person, place, and time.  Skin: Skin is warm.  Psychiatric: Affect normal.     LABORATORY DATA:  I have reviewed the data as listed Lab Results  Component Value Date   WBC 8.8 09/29/2019   HGB 14.8 09/29/2019   HCT 43.0 09/29/2019   MCV 89.2 09/29/2019   PLT 279 09/29/2019   Recent Labs    11/03/18 0805 09/29/19 0858  NA 140 140  K 4.3 4.6  CL 104 104  CO2 27 30  GLUCOSE 90 98  BUN 24* 26*  CREATININE 1.35 1.21  CALCIUM 9.5 9.5  PROT 7.6 6.9  ALBUMIN 4.4 4.2  AST 33 20  ALT 25 19  ALKPHOS 91 106  BILITOT 0.6 0.5    RADIOGRAPHIC STUDIES: I have personally reviewed the radiological images as listed and agreed with the findings in the report. US Venous Img Lower Unilateral Left  Result Date: 09/24/2019 CLINICAL DATA:  Left lower extremity pain and edema. Evaluate for DVT. EXAM: LEFT LOWER EXTREMITY VENOUS DOPPLER ULTRASOUND TECHNIQUE: Gray-scale sonography with graded compression, as well as color Doppler and duplex ultrasound were performed to  evaluate the lower extremity deep venous systems from the level of the common femoral vein and including the common femoral, femoral, profunda femoral, popliteal and calf veins including the posterior tibial, peroneal and gastrocnemius veins when visible. The superficial great saphenous vein was also interrogated. Spectral Doppler was utilized to evaluate flow at rest and with distal augmentation maneuvers in the common femoral, femoral and popliteal veins. COMPARISON:  None. FINDINGS: Contralateral Common Femoral Vein: Respiratory phasicity is normal and symmetric with the symptomatic side. No evidence of thrombus. Normal compressibility. Common Femoral Vein: No evidence of thrombus. Normal compressibility, respiratory phasicity and response to augmentation. Saphenofemoral Junction: No evidence of thrombus. Normal compressibility and flow on color Doppler imaging. Profunda Femoral Vein: No evidence of thrombus. Normal compressibility and flow on color Doppler imaging. Femoral Vein: No evidence of thrombus. Normal compressibility, respiratory phasicity and response to augmentation. Popliteal Vein: Age-indeterminate mixed echogenic occlusive thrombus involving the left popliteal vein (images 27 and 30). Calf Veins: There is hypoechoic expansile thrombus involving both paired left posterior tibial veins (image 33). Both paired peroneal veins appear patent where imaged. Superficial Great Saphenous Vein: No evidence of thrombus. Normal compressibility. Other Findings:  None. IMPRESSION: Examination is positive for age-indeterminate occlusive DVT involving the left popliteal vein extending to involve both paired left posterior tibial veins. In the absence of prior examinations, an acute on chronic process is not excluded. Clinical correlation is advised. Electronically Signed   By: Sandi Mariscal M.D.   On: 09/24/2019 11:59    ASSESSMENT & PLAN:   Deep vein thrombosis (DVT) of popliteal vein of left lower extremity  [facility]) # LEFT POPLITEAL VEIN DVT-ultrasound January 2021-occlusive DVT left popliteal vein extending into left posterior tibial veins-age-indeterminate.  Unprovoked-no identifiable cause noted.  #Clinically appears acute-given the onset of symptoms.  Agree with proceeding with anticoagulation for 3 months.  Patient to follow-up with PCP regarding continued prescription for anticoagulation with Eliquis.  #Discussed that would recommend further work-up hypercoagulable work-up/D-dimer at next visit.  Discussed that this is however unlikely to influence patient's current duration of anticoagulation.  Discussed that we also consider ultrasound for follow-up.  Thank you Dr. Caryn Section for allowing me to participate in  the care of your pleasant patient. Please do not hesitate to contact me with questions or concerns in the interim.  # DISPOSITION: # 1st week of may 2021; 10 days prior labs- cbc-Dr.B   Cc; Dr.G   All questions were answered. The patient knows to call the clinic with any problems, questions or concerns.    Cammie Sickle, MD 10/11/2019 8:09 AM

## 2019-10-15 ENCOUNTER — Ambulatory Visit: Payer: PPO

## 2019-10-20 ENCOUNTER — Ambulatory Visit: Payer: PPO

## 2019-10-22 ENCOUNTER — Telehealth: Payer: PPO | Admitting: Internal Medicine

## 2019-10-22 ENCOUNTER — Telehealth: Payer: Self-pay

## 2019-10-22 NOTE — Telephone Encounter (Signed)
Left message on vm per dpr relaying Dr. G's message.  

## 2019-10-22 NOTE — Telephone Encounter (Signed)
Patient called and l/m stating that he is scheduled to receive COVID vaccine tomorrow-10/23/19, and he needed to get a verbal ok to proceed with this since he is taking blood thinner. Please review today. Thank you

## 2019-10-22 NOTE — Telephone Encounter (Signed)
Ok to receive this vaccine. I'm glad he got scheduled.

## 2019-10-23 ENCOUNTER — Ambulatory Visit: Payer: PPO | Attending: Internal Medicine

## 2019-10-23 DIAGNOSIS — Z23 Encounter for immunization: Secondary | ICD-10-CM | POA: Insufficient documentation

## 2019-10-23 NOTE — Progress Notes (Signed)
   Covid-19 Vaccination Clinic  Name:  William Shelton    MRN: [id number] DOB: 1950/11/06  10/23/2019  [name] was observed post Covid-19 immunization for 15 minutes without incidence. He was provided with Vaccine Information Sheet and instruction to access the V-Safe system.   [name] was instructed to call 911 with any severe reactions post vaccine: Marland Kitchen Difficulty breathing  . Swelling of your face and throat  . A fast heartbeat  . A bad rash all over your body  . Dizziness and weakness    Immunizations Administered    Name Date Dose VIS Date Route   Pfizer COVID-19 Vaccine 10/23/2019  5:11 PM 0.3 mL 08/27/2019 Intramuscular   Manufacturer: [place]   Lot: [id number]   [place]: [id number]

## 2019-11-13 ENCOUNTER — Encounter: Payer: Self-pay | Admitting: Family Medicine

## 2019-11-17 ENCOUNTER — Ambulatory Visit: Payer: PPO | Attending: Internal Medicine

## 2019-11-17 DIAGNOSIS — Z23 Encounter for immunization: Secondary | ICD-10-CM | POA: Insufficient documentation

## 2019-11-17 NOTE — Progress Notes (Signed)
   Covid-19 Vaccination Clinic  Name:  William Shelton    MRN: [id number] DOB: 07/12/1951  11/17/2019  William Shelton was observed post Covid-19 immunization for 15 minutes without incident. He was provided with Vaccine Information Sheet and instruction to access the V-Safe system.   William Shelton was instructed to call 911 with any severe reactions post vaccine: Marland Kitchen Difficulty breathing  . Swelling of face and throat  . A fast heartbeat  . A bad rash all over body  . Dizziness and weakness   Immunizations Administered    Name Date Dose VIS Date Route   Pfizer COVID-19 Vaccine 11/17/2019  2:05 PM 0.3 mL 08/27/2019 Intramuscular   Manufacturer: [place]   Lot: [id number]   [place]: [id number]

## 2020-01-07 ENCOUNTER — Inpatient Hospital Stay: Payer: PPO | Attending: Internal Medicine

## 2020-01-07 ENCOUNTER — Other Ambulatory Visit: Payer: Self-pay

## 2020-01-07 ENCOUNTER — Other Ambulatory Visit: Payer: Self-pay | Admitting: Family Medicine

## 2020-01-07 DIAGNOSIS — Z86718 Personal history of other venous thrombosis and embolism: Secondary | ICD-10-CM | POA: Insufficient documentation

## 2020-01-07 DIAGNOSIS — I82432 Acute embolism and thrombosis of left popliteal vein: Secondary | ICD-10-CM

## 2020-01-07 LAB — FIBRIN DERIVATIVES D-DIMER (ARMC ONLY): Fibrin derivatives D-dimer (ARMC): 386.82 ng/mL (FEU) (ref 0.00–499.00)

## 2020-01-07 LAB — CBC WITH DIFFERENTIAL/PLATELET
Abs Immature Granulocytes: 0.01 10*3/uL (ref 0.00–0.07)
Basophils Absolute: 0.1 10*3/uL (ref 0.0–0.1)
Basophils Relative: 1 %
Eosinophils Absolute: 0.5 10*3/uL (ref 0.0–0.5)
Eosinophils Relative: 6 %
HCT: 47.4 % (ref 39.0–52.0)
Hemoglobin: 15.4 g/dL (ref 13.0–17.0)
Immature Granulocytes: 0 %
Lymphocytes Relative: 35 %
Lymphs Abs: 2.5 10*3/uL (ref 0.7–4.0)
MCH: 29.8 pg (ref 26.0–34.0)
MCHC: 32.5 g/dL (ref 30.0–36.0)
MCV: 91.9 fL (ref 80.0–100.0)
Monocytes Absolute: 0.6 10*3/uL (ref 0.1–1.0)
Monocytes Relative: 9 %
Neutro Abs: 3.4 10*3/uL (ref 1.7–7.7)
Neutrophils Relative %: 49 %
Platelets: 249 10*3/uL (ref 150–400)
RBC: 5.16 MIL/uL (ref 4.22–5.81)
RDW: 13.2 % (ref 11.5–15.5)
WBC: 7 10*3/uL (ref 4.0–10.5)
nRBC: 0 % (ref 0.0–0.2)

## 2020-01-07 LAB — ANTITHROMBIN III: AntiThromb III Func: 92 % (ref 75–120)

## 2020-01-08 LAB — ANTIPHOSPHOLIPID SYNDROME PROF
Anticardiolipin IgG: <9 GPL U/mL (ref 0–14)
Anticardiolipin IgM: <9 MPL U/mL (ref 0–12)
DRVVT: 46.4 s (ref 0.0–47.0)
PTT Lupus Anticoagulant: 32.2 s (ref 0.0–51.9)

## 2020-01-08 LAB — PROTEIN S, TOTAL: Protein S Ag, Total: 100 % (ref 60–150)

## 2020-01-09 LAB — PROTEIN C, TOTAL: Protein C, Total: 105 % (ref 60–150)

## 2020-01-12 LAB — FACTOR 5 LEIDEN

## 2020-01-13 LAB — PROTHROMBIN GENE MUTATION

## 2020-01-21 ENCOUNTER — Inpatient Hospital Stay: Payer: PPO | Attending: Internal Medicine | Admitting: Internal Medicine

## 2020-01-21 ENCOUNTER — Other Ambulatory Visit: Payer: Self-pay

## 2020-01-21 ENCOUNTER — Encounter: Payer: Self-pay | Admitting: Family Medicine

## 2020-01-21 DIAGNOSIS — I82432 Acute embolism and thrombosis of left popliteal vein: Secondary | ICD-10-CM | POA: Diagnosis not present

## 2020-01-21 NOTE — Assessment & Plan Note (Addendum)
#   LEFT POPLITEAL VEIN DVT-ultrasound January 2021-occlusive DVT left popliteal vein extending into left posterior tibial veins-age-indeterminate.  Unprovoked-no identifiable cause noted.  Hypercoagulable Negative.  D-dimer negative.  #Patient about 5 months of anticoagulation with Eliquis.  Recommend stopping Eliquis.   #Cancer screening colonoscopy- > 10 years; slightly elevated PSA- 6 [Dr.Brandon]; recommend follow-up with PCP/GI regarding screening colonoscopy.  Clinically no other concerns for malignancy.  # DISPOSITION: #Follow-up as needed Dr.B  Dr. Danise Mina.    Cc; Dr.G

## 2020-01-21 NOTE — Progress Notes (Signed)
I connected with [name] on 01/21/20 at  1:00 PM EDT by video enabled telemedicine visit and verified that I am speaking with the correct person using two identifiers.  I discussed the limitations, risks, security and privacy concerns of performing an evaluation and management service by telemedicine and the availability of in-person appointments. I also discussed with the patient that there may be a patient responsible charge related to this service. The patient expressed understanding and agreed to proceed.    Other persons participating in the visit and their role in the encounter: RN/medical reconciliation Patient's location: Home Provider's location: office  Oncology History   No history exists.   # LEFT POPLITEAL DVT- [jan 3rd 2021]-occlusive DVT of the left popliteal vein/involving posterior tibial veins-age indeterminate; Eliquis; ; May 2021- hypercoagulable work-up-factor V Leiden/prothrombin gene mutation/antiphospholipid antibody syndrome/protein C&S-normal; D-dimer normal.   Chief Complaint: Left LE DVT   History of present illness:William Shelton 69 y.o.  male with history of left lower extremity DVT currently status post Eliquis is here for follow-up.  Patient denies any significant swelling of the left leg.  Denies any shortness of breath or cough.  Denies any blood in stools or black or stools.  No nausea vomiting.    Observation/objective: No acute distress.  Assessment and plan: Deep vein thrombosis (DVT) of popliteal vein of left lower extremity [facility]) # LEFT POPLITEAL VEIN DVT-ultrasound January 2021-occlusive DVT left popliteal vein extending into left posterior tibial veins-age-indeterminate.  Unprovoked-no identifiable cause noted.  Hypercoagulable Negative.  D-dimer negative.  #Patient about 5 months of anticoagulation with Eliquis.  Recommend stopping Eliquis.   #Cancer screening colonoscopy- > 10 years; slightly elevated PSA- 6 [Dr.Brandon];  recommend follow-up with PCP/GI regarding screening colonoscopy.  Clinically no other concerns for malignancy.  # DISPOSITION: #Follow-up as needed Dr.B  Dr. Danise Mina.    Cc; Dr.G  Follow-up instructions:  I discussed the assessment and treatment plan with the patient.  The patient was provided an opportunity to ask questions and all were answered.  The patient agreed with the plan and demonstrated understanding of instructions.  The patient was advised to call back or seek an in person evaluation if the symptoms worsen or if the condition fails to improve as anticipated.   Dr. Charlaine Dalton [place] [facility] 01/21/2020 1:09 PM

## 2020-02-07 ENCOUNTER — Telehealth: Payer: Self-pay | Admitting: Family Medicine

## 2020-02-07 NOTE — Telephone Encounter (Signed)
Left message for patient to call back and schedule Medicare Annual Wellness Visit (AWV) with Nurse Health Advisor   This should be a telephone visit only.  Last AWV 11/10/18

## 2020-02-13 ENCOUNTER — Other Ambulatory Visit: Payer: Self-pay | Admitting: Family Medicine

## 2020-02-28 ENCOUNTER — Encounter: Payer: Self-pay | Admitting: Family Medicine

## 2020-02-29 ENCOUNTER — Encounter: Payer: Self-pay | Admitting: Internal Medicine

## 2020-03-01 ENCOUNTER — Other Ambulatory Visit: Payer: Self-pay

## 2020-03-01 ENCOUNTER — Other Ambulatory Visit: Payer: PPO

## 2020-03-01 DIAGNOSIS — R972 Elevated prostate specific antigen [PSA]: Secondary | ICD-10-CM | POA: Diagnosis not present

## 2020-03-01 NOTE — Telephone Encounter (Signed)
Pt scheduled 03/14/20 @ 8:15

## 2020-03-02 ENCOUNTER — Other Ambulatory Visit: Payer: PPO

## 2020-03-02 ENCOUNTER — Encounter: Payer: Self-pay | Admitting: Urology

## 2020-03-02 LAB — PSA: Prostate Specific Ag, Serum: 6.1 ng/mL — ABNORMAL HIGH (ref 0.0–4.0)

## 2020-03-02 NOTE — Telephone Encounter (Signed)
-----   Message from [name], MD sent at 03/02/2020 12:59 PM EDT ----- PSA is stable, 6.1.  WE will continue to follow closely.  See you in Dec 2021.  [name], MD

## 2020-03-03 ENCOUNTER — Encounter: Payer: Self-pay | Admitting: Family Medicine

## 2020-03-03 DIAGNOSIS — H9193 Unspecified hearing loss, bilateral: Secondary | ICD-10-CM

## 2020-03-03 NOTE — Telephone Encounter (Signed)
See pt message

## 2020-03-07 DIAGNOSIS — D3131 Benign neoplasm of right choroid: Secondary | ICD-10-CM | POA: Diagnosis not present

## 2020-03-07 DIAGNOSIS — D3132 Benign neoplasm of left choroid: Secondary | ICD-10-CM | POA: Diagnosis not present

## 2020-03-07 DIAGNOSIS — H5203 Hypermetropia, bilateral: Secondary | ICD-10-CM | POA: Diagnosis not present

## 2020-03-07 DIAGNOSIS — H524 Presbyopia: Secondary | ICD-10-CM | POA: Diagnosis not present

## 2020-03-07 DIAGNOSIS — H2513 Age-related nuclear cataract, bilateral: Secondary | ICD-10-CM | POA: Diagnosis not present

## 2020-03-14 ENCOUNTER — Ambulatory Visit (INDEPENDENT_AMBULATORY_CARE_PROVIDER_SITE_OTHER): Payer: PPO

## 2020-03-14 VITALS — Wt 180.0 lb

## 2020-03-14 DIAGNOSIS — Z0000 Encounter for general adult medical examination without abnormal findings: Secondary | ICD-10-CM

## 2020-03-14 NOTE — Patient Instructions (Signed)
[name] , Thank you for taking time to come for your Medicare Wellness Visit. I appreciate your ongoing commitment to your health goals. Please review the following plan we discussed and let me know if I can assist you in the future.   Screening recommendations/referrals: Colonoscopy: FOBT completed 10/01/2019, due 09/2020 Recommended yearly ophthalmology/optometry visit for glaucoma screening and checkup Recommended yearly dental visit for hygiene and checkup  Vaccinations: Influenza vaccine: Up to date, completed 07/01/2019, due 04/2020 Pneumococcal vaccine: Completed series Tdap vaccine: due, check with pharmacy or health dept Shingles vaccine: due, check with insurance for coverage    Advanced directives: Please bring a copy of your POA (Power of [place]) and/or Living Will to your next appointment.    Conditions/risks identified: hypertension, hyperlipidemia  Next appointment: none  Preventive Care 70 Years and Older, Male Preventive care refers to lifestyle choices and visits with your health care provider that can promote health and wellness. What does preventive care include?  A yearly physical exam. This is also called an annual well check.  Dental exams once or twice a year.  Routine eye exams. Ask your health care provider how often you should have your eyes checked.  Personal lifestyle choices, including:  Daily care of your teeth and gums.  Regular physical activity.  Eating a healthy diet.  Avoiding tobacco and drug use.  Limiting alcohol use.  Practicing safe sex.  Taking low doses of aspirin every day.  Taking vitamin and mineral supplements as recommended by your health care provider. What happens during an annual well check? The services and screenings done by your health care provider during your annual well check will depend on your age, overall health, lifestyle risk factors, and family history of disease. Counseling  Your health care  provider may ask you questions about your:  Alcohol use.  Tobacco use.  Drug use.  Emotional well-being.  Home and relationship well-being.  Sexual activity.  Eating habits.  History of falls.  Memory and ability to understand (cognition).  Work and work Statistician. Screening  You may have the following tests or measurements:  Height, weight, and BMI.  Blood pressure.  Lipid and cholesterol levels. These may be checked every 5 years, or more frequently if you are over 40 years old.  Skin check.  Lung cancer screening. You may have this screening every year starting at age 51 if you have a 30-pack-year history of smoking and currently smoke or have quit within the past 15 years.  Fecal occult blood test (FOBT) of the stool. You may have this test every year starting at age 17.  Flexible sigmoidoscopy or colonoscopy. You may have a sigmoidoscopy every 5 years or a colonoscopy every 10 years starting at age 53.  Prostate cancer screening. Recommendations will vary depending on your family history and other risks.  Hepatitis C blood test.  Hepatitis B blood test.  Sexually transmitted disease (STD) testing.  Diabetes screening. This is done by checking your blood sugar (glucose) after you have not eaten for a while (fasting). You may have this done every 1-3 years.  Abdominal aortic aneurysm (AAA) screening. You may need this if you are a current or former smoker.  Osteoporosis. You may be screened starting at age 48 if you are at high risk. Talk with your health care provider about your test results, treatment options, and if necessary, the need for more tests. Vaccines  Your health care provider may recommend certain vaccines, such as:  Influenza vaccine.  This is recommended every year.  Tetanus, diphtheria, and acellular pertussis (Tdap, Td) vaccine. You may need a Td booster every 10 years.  Zoster vaccine. You may need this after age 11.  Pneumococcal  13-valent conjugate (PCV13) vaccine. One dose is recommended after age 19.  Pneumococcal polysaccharide (PPSV23) vaccine. One dose is recommended after age 55. Talk to your health care provider about which screenings and vaccines you need and how often you need them. This information is not intended to replace advice given to you by your health care provider. Make sure you discuss any questions you have with your health care provider. Document Released: 09/29/2015 Document Revised: 05/22/2016 Document Reviewed: 07/04/2015 Elsevier Interactive Patient Education  2017 [place] Prevention in the Home Falls can cause injuries. They can happen to people of all ages. There are many things you can do to make your home safe and to help prevent falls. What can I do on the outside of my home?  Regularly fix the edges of walkways and driveways and fix any cracks.  Remove anything that might make you trip as you walk through a door, such as a raised step or threshold.  Trim any bushes or trees on the path to your home.  Use bright outdoor lighting.  Clear any walking paths of anything that might make someone trip, such as rocks or tools.  Regularly check to see if handrails are loose or broken. Make sure that both sides of any steps have handrails.  Any raised decks and porches should have guardrails on the edges.  Have any leaves, snow, or ice cleared regularly.  Use sand or salt on walking paths during winter.  Clean up any spills in your garage right away. This includes oil or grease spills. What can I do in the bathroom?  Use night lights.  Install grab bars by the toilet and in the tub and shower. Do not use towel bars as grab bars.  Use non-skid mats or decals in the tub or shower.  If you need to sit down in the shower, use a plastic, non-slip stool.  Keep the floor dry. Clean up any water that spills on the floor as soon as it happens.  Remove soap buildup in the  tub or shower regularly.  Attach bath mats securely with double-sided non-slip rug tape.  Do not have throw rugs and other things on the floor that can make you trip. What can I do in the bedroom?  Use night lights.  Make sure that you have a light by your bed that is easy to reach.  Do not use any sheets or blankets that are too big for your bed. They should not hang down onto the floor.  Have a firm chair that has side arms. You can use this for support while you get dressed.  Do not have throw rugs and other things on the floor that can make you trip. What can I do in the kitchen?  Clean up any spills right away.  Avoid walking on wet floors.  Keep items that you use a lot in easy-to-reach places.  If you need to reach something above you, use a strong step stool that has a grab bar.  Keep electrical cords out of the way.  Do not use floor polish or wax that makes floors slippery. If you must use wax, use non-skid floor wax.  Do not have throw rugs and other things on the floor that can make  you trip. What can I do with my stairs?  Do not leave any items on the stairs.  Make sure that there are handrails on both sides of the stairs and use them. Fix handrails that are broken or loose. Make sure that handrails are as long as the stairways.  Check any carpeting to make sure that it is firmly attached to the stairs. Fix any carpet that is loose or worn.  Avoid having throw rugs at the top or bottom of the stairs. If you do have throw rugs, attach them to the floor with carpet tape.  Make sure that you have a light switch at the top of the stairs and the bottom of the stairs. If you do not have them, ask someone to add them for you. What else can I do to help prevent falls?  Wear shoes that:  Do not have high heels.  Have rubber bottoms.  Are comfortable and fit you well.  Are closed at the toe. Do not wear sandals.  If you use a stepladder:  Make sure that it  is fully opened. Do not climb a closed stepladder.  Make sure that both sides of the stepladder are locked into place.  Ask someone to hold it for you, if possible.  Clearly mark and make sure that you can see:  Any grab bars or handrails.  First and last steps.  Where the edge of each step is.  Use tools that help you move around (mobility aids) if they are needed. These include:  Canes.  Walkers.  Scooters.  Crutches.  Turn on the lights when you go into a dark area. Replace any light bulbs as soon as they burn out.  Set up your furniture so you have a clear path. Avoid moving your furniture around.  If any of your floors are uneven, fix them.  If there are any pets around you, be aware of where they are.  Review your medicines with your doctor. Some medicines can make you feel dizzy. This can increase your chance of falling. Ask your doctor what other things that you can do to help prevent falls. This information is not intended to replace advice given to you by your health care provider. Make sure you discuss any questions you have with your health care provider. Document Released: 06/29/2009 Document Revised: 02/08/2016 Document Reviewed: 10/07/2014 Elsevier Interactive Patient Education  2017 [employer].

## 2020-03-14 NOTE — Progress Notes (Signed)
Subjective:   William Shelton is a 69 y.o. male who presents for Medicare Annual/Subsequent preventive examination.  Review of Systems: N/A     I connected with the patient today by telephone and verified that I am speaking with the correct person using two identifiers. Location patient: home Location nurse: work Persons participating in the virtual visit: patient, Marine scientist.   I discussed the limitations, risks, security and privacy concerns of performing an evaluation and management service by telephone and the availability of in person appointments. I also discussed with the patient that there may be a patient responsible charge related to this service. The patient expressed understanding and verbally consented to this telephonic visit.    Interactive audio and video telecommunications were attempted between this nurse and patient, however failed, due to patient having technical difficulties OR patient did not have access to video capability.  We continued and completed visit with audio only.     Cardiac Risk Factors include: advanced age (>44men, >96 women);hypertension;dyslipidemia;male gender     Objective:    Today's Vitals   03/14/20 0812  Weight: 180 lb (81.6 kg)   Body mass index is 24.41 kg/m.  Advanced Directives 03/14/2020  Does Patient Have a Medical Advance Directive? Yes  Type of Paramedic of [place];Living will  Copy of [place] in Chart? No - copy requested    Current Medications (verified) Outpatient Encounter Medications as of 03/14/2020  Medication Sig  . amLODipine (NORVASC) 10 MG tablet TAKE 1 TABLET BY MOUTH EVERY DAY  . Cholecalciferol (VITAMIN D) 2000 units CAPS Take 1 capsule (2,000 Units total) by mouth daily.  Marland Kitchen EPINEPHrine 0.3 mg/0.3 mL IJ SOAJ injection Inject 0.3 mLs (0.3 mg total) into the muscle once.  . Multiple Vitamin (MULTIVITAMIN) capsule Take 1 capsule by mouth daily.  . Omega-3 Fatty Acids  (FISH OIL PO) Take 1 capsule by mouth daily.   No facility-administered encounter medications on file as of 03/14/2020.    Allergies (verified) Patient has no known allergies.   History: Past Medical History:  Diagnosis Date  . Allergic rhinitis, cause unspecified   . Arthritis   . BPH (benign prostatic hypertrophy)   . BPV (benign positional vertigo) 09/28/2015  . Chronic kidney disease (CKD), stage III (moderate) 10/22/2011  . Clotting disorder ([place]) 09/2019   left leg DVT  . ED (erectile dysfunction)   . Elevated prostate specific antigen (PSA) 2016   eval by Dr Erlene Quan - s/p benign biopsy   . Essential hypertension 08/04/2014  . Hematuria, microscopic   . HLD (hyperlipidemia)   . Hydrocele, left   . IBS (irritable bowel syndrome)   . Internal hemorrhoid    Past Surgical History:  Procedure Laterality Date  . COLONOSCOPY  07/12/08   Internal hemorrhoids---Dr. Fuller Plan  . INGUINAL HERNIA REPAIR  07/21/02   Left---Dr. Bary Castilla   Family History  Problem Relation Age of Onset  . Hypertension Father   . Prostate cancer Father 66       Radiation seeds  . Macular degeneration Father   . Hyperlipidemia Brother   . Other Brother        BPH  . Diabetes Neg Hx   . Breast cancer Neg Hx   . Colon cancer Neg Hx   . Depression Neg Hx   . Alcohol abuse Neg Hx   . Stroke Neg Hx   . CAD Neg Hx    Social History   Socioeconomic History  .  Marital status: Married    Spouse name: Not on file  . Number of children: 1  . Years of education: Not on file  . Highest education level: Not on file  Occupational History  . Occupation: Retired  Tobacco Use  . Smoking status: Never Smoker  . Smokeless tobacco: Never Used  Substance and Sexual Activity  . Alcohol use: No  . Drug use: No  . Sexual activity: Not on file  Other Topics Concern  . Not on file  Social History Narrative   Lives in [place]. Married; lives with wife; 1 son Retired Surveyor, mining ;  Activity: walks daily Diet: good water, fruits/vegetables daily.      No smoking; ocassional beer.    Social Determinants of Health   Financial Resource Strain: Low Risk   . Difficulty of Paying Living Expenses: Not hard at all  Food Insecurity: No Food Insecurity  . Worried About Charity fundraiser in the Last Year: Never true  . Ran Out of Food in the Last Year: Never true  Transportation Needs: No Transportation Needs  . Lack of Transportation (Medical): No  . Lack of Transportation (Non-Medical): No  Physical Activity: Sufficiently Active  . Days of Exercise per Week: 7 days  . Minutes of Exercise per Session: 60 min  Stress: No Stress Concern Present  . Feeling of Stress : Not at all  Social Connections:   . Frequency of Communication with Friends and Family:   . Frequency of Social Gatherings with Friends and Family:   . Attends Religious Services:   . Active Member of Clubs or Organizations:   . Attends Archivist Meetings:   Marland Kitchen Marital Status:     Tobacco Counseling Counseling given: Not Answered   Clinical Intake:  Pre-visit preparation completed: Yes  Pain : No/denies pain     Nutritional Risks: None Diabetes: No  How often do you need to have someone help you when you read instructions, pamphlets, or other written materials from your doctor or pharmacy?: 1 - Never What is the last grade level you completed in school?: Associate  Diabetic: No Nutrition Risk Assessment:  Has the patient had any N/V/D within the last 2 months?  No  Does the patient have any non-healing wounds?  No  Has the patient had any unintentional weight loss or weight gain?  No   Diabetes:  Is the patient diabetic?  No  If diabetic, was a CBG obtained today?  No  Did the patient bring in their glucometer from home?  No  How often do you monitor your CBG's? N/A.   Financial Strains and Diabetes Management:  Are you having any financial strains with the device, your  supplies or your medication? No .  Does the patient want to be seen by Chronic Care Management for management of their diabetes?  No  Would the patient like to be referred to a Nutritionist or for Diabetic Management?  No     Interpreter Needed?: No  Information entered by :: CJohnson, LPN   Activities of Daily Living In your present state of health, do you have any difficulty performing the following activities: 03/14/2020  Hearing? Y  Comment wears hearing aids  Vision? N  Difficulty concentrating or making decisions? N  Walking or climbing stairs? N  Dressing or bathing? N  Doing errands, shopping? N  Preparing Food and eating ? N  Using the Toilet? N  In the past six months, have you  accidently leaked urine? N  Do you have problems with loss of bowel control? N  Managing your Medications? N  Managing your Finances? N  Housekeeping or managing your Housekeeping? N  Some recent data might be hidden    Patient Care Team: Ria Bush, MD as PCP - General (Family Medicine)  Indicate any recent Medical Services you may have received from other than [facility] providers in the past year (date may be approximate).     Assessment:   This is a routine wellness examination for [place].  Hearing/Vision screen  Hearing Screening   125Hz  250Hz  500Hz  1000Hz  2000Hz  3000Hz  4000Hz  6000Hz  8000Hz   Right ear:           Left ear:           Vision Screening Comments: Patient gets annual eye exams   Dietary issues and exercise activities discussed: Current Exercise Habits: Home exercise routine, Type of exercise: walking, Time (Minutes): 60, Frequency (Times/Week): 7, Weekly Exercise (Minutes/Week): 420, Intensity: Moderate, Exercise limited by: None identified  Goals    . Patient Stated     03/14/2020, I will continue to walk everyday for 3 miles.       Depression Screen PHQ 2/9 Scores 03/14/2020 10/01/2019 11/10/2018 11/05/2017 10/31/2016 10/12/2015  PHQ - 2 Score 0 0 0 0 0 0  PHQ- 9  Score 0 - - - - -    Fall Risk Fall Risk  03/14/2020 10/01/2019 11/10/2018 11/05/2017 10/31/2016  Falls in the past year? 0 0 0 No No  Number falls in past yr: 0 - - - -  Injury with Fall? 0 - - - -  Risk for fall due to : Medication side effect - - - -  Follow up Falls evaluation completed;Falls prevention discussed - - - -    Any stairs in or around the home? Yes  If so, are there any without handrails? No  Home free of loose throw rugs in walkways, pet beds, electrical cords, etc? Yes  Adequate lighting in your home to reduce risk of falls? Yes   ASSISTIVE DEVICES UTILIZED TO PREVENT FALLS:   Life alert? No  Use of a cane, walker or w/c? No  Grab bars in the bathroom? No  Shower chair or bench in shower? No  Elevated toilet seat or a handicapped toilet? No   TIMED UP AND GO:  Was the test performed? N/A, telephonic visit .    Cognitive Function: MMSE - Mini Mental State Exam 03/14/2020  Orientation to time 5  Orientation to Place 5  Registration 3  Attention/ Calculation 5  Recall 3  Language- repeat 1       Mini Cog  Mini-Cog screen was completed. Maximum score is 22. A value of 0 denotes this part of the MMSE was not completed or the patient failed this part of the Mini-Cog screening.  Immunizations Immunization History  Administered Date(s) Administered  . Fluad Quad(high Dose 65+) 07/01/2019  . Influenza Split 06/05/2011, 07/22/2012  . Influenza Whole 06/07/2009, 08/04/2010  . Influenza, High Dose Seasonal PF 07/31/2017, 07/10/2018  . Influenza,inj,Quad PF,6+ Mos 08/04/2014, 08/03/2015, 06/21/2016  . PFIZER SARS-COV-2 Vaccination 10/23/2019, 11/17/2019  . Pneumococcal Conjugate-13 10/12/2015  . Pneumococcal Polysaccharide-23 10/31/2016  . Td 05/30/2008  . Zoster 08/04/2014    TDAP status: Due, Education has been provided regarding the importance of this vaccine. Advised may receive this vaccine at local pharmacy or Health Dept. Aware to provide a copy of  the vaccination record if obtained  from local pharmacy or Health Dept. Verbalized acceptance and understanding. Flu Vaccine status: Up to date Pneumococcal vaccine status: Up to date Covid-19 vaccine status: Completed vaccines  Qualifies for Shingles Vaccine? Yes   Zostavax completed Yes   Shingrix Completed?: No.    Education has been provided regarding the importance of this vaccine. Patient has been advised to call insurance company to determine out of pocket expense if they have not yet received this vaccine. Advised may also receive vaccine at local pharmacy or Health Dept. Verbalized acceptance and understanding.  Screening Tests Health Maintenance  Topic Date Due  . DTAP VACCINES (1) 05/23/1951  . DTaP/Tdap/Td (2 - Tdap) 05/30/2018  . COLONOSCOPY  07/12/2018  . TETANUS/TDAP  03/14/2024 (Originally 05/30/2018)  . INFLUENZA VACCINE  04/16/2020  . COLON CANCER SCREENING ANNUAL FOBT  09/30/2020  . COVID-19 Vaccine  Completed  . Hepatitis C Screening  Completed  . PNA vac Low Risk Adult  Completed    Health Maintenance  Health Maintenance Due  Topic Date Due  . DTAP VACCINES (1) 05/23/1951  . DTaP/Tdap/Td (2 - Tdap) 05/30/2018  . COLONOSCOPY  07/12/2018    Colorectal cancer screening: FOBT Completed 10/01/2019. Repeat every 1 years  Lung Cancer Screening: (Low Dose CT Chest recommended if Age 61-80 years, 30 pack-year currently smoking OR have quit w/in 15years.) does not qualify.    Additional Screening:  Hepatitis C Screening: does qualify; Completed 10/06/2015  Vision Screening: Recommended annual ophthalmology exams for early detection of glaucoma and other disorders of the eye. Is the patient up to date with their annual eye exam?  Yes  Who is the provider or what is the name of the office in which the patient attends annual eye exams? [facility] If pt is not established with a provider, would they like to be referred to a provider to establish care? No .    Dental Screening: Recommended annual dental exams for proper oral hygiene  Community Resource Referral / Chronic Care Management: CRR required this visit?  No   CCM required this visit?  No      Plan:     I have personally reviewed and noted the following in the patient's chart:   . Medical and social history . Use of alcohol, tobacco or illicit drugs  . Current medications and supplements . Functional ability and status . Nutritional status . Physical activity . Advanced directives . List of other physicians . Hospitalizations, surgeries, and ER visits in previous 12 months . Vitals . Screenings to include cognitive, depression, and falls . Referrals and appointments  In addition, I have reviewed and discussed with patient certain preventive protocols, quality metrics, and best practice recommendations. A written personalized care plan for preventive services as well as general preventive health recommendations were provided to patient.   Due to this being a telephonic visit, the after visit summary with patients personalized plan was offered to patient via mail or my-chart. Patient preferred to pick up at office at next visit.   Andrez Grime, LPN   02/27/[id number]

## 2020-03-14 NOTE — Progress Notes (Signed)
PCP notes:  Health Maintenance: Tdap- insurance/financial   Abnormal Screenings: none   Patient concerns: none   Nurse concerns: none   Next PCP appt.: none 

## 2020-03-17 DIAGNOSIS — H90A21 Sensorineural hearing loss, unilateral, right ear, with restricted hearing on the contralateral side: Secondary | ICD-10-CM | POA: Diagnosis not present

## 2020-03-17 DIAGNOSIS — H90A32 Mixed conductive and sensorineural hearing loss, unilateral, left ear with restricted hearing on the contralateral side: Secondary | ICD-10-CM | POA: Diagnosis not present

## 2020-04-27 DIAGNOSIS — H903 Sensorineural hearing loss, bilateral: Secondary | ICD-10-CM | POA: Diagnosis not present

## 2020-04-27 DIAGNOSIS — H9313 Tinnitus, bilateral: Secondary | ICD-10-CM | POA: Insufficient documentation

## 2020-04-27 DIAGNOSIS — Z974 Presence of external hearing-aid: Secondary | ICD-10-CM | POA: Diagnosis not present

## 2020-04-28 ENCOUNTER — Encounter: Payer: Self-pay | Admitting: Family Medicine

## 2020-04-28 DIAGNOSIS — H903 Sensorineural hearing loss, bilateral: Secondary | ICD-10-CM | POA: Insufficient documentation

## 2020-05-19 IMAGING — US US EXTREM LOW VENOUS*L*
1 series · 13 of 24 positions shown · non-contrast
Comparison: None.

CLINICAL DATA: Left lower extremity pain and edema. Evaluate for
DVT.



[Series 1: us extrem low venous*left* · 13 of 34 slices shown]
[im 1/34]
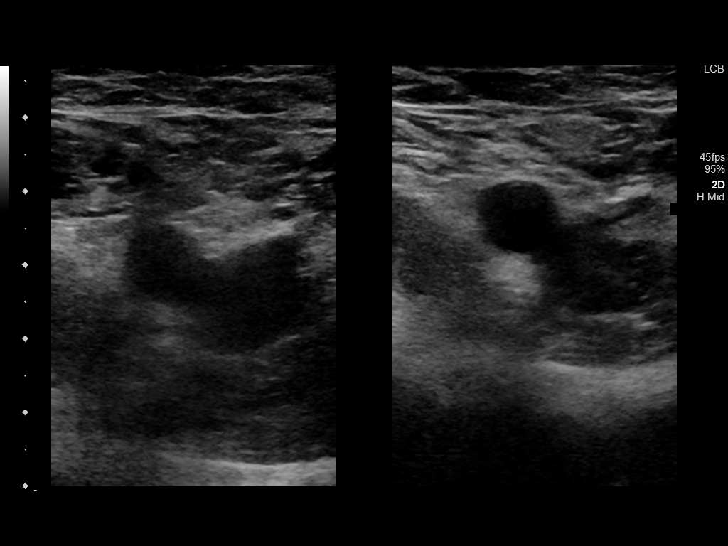
[im 3/34]
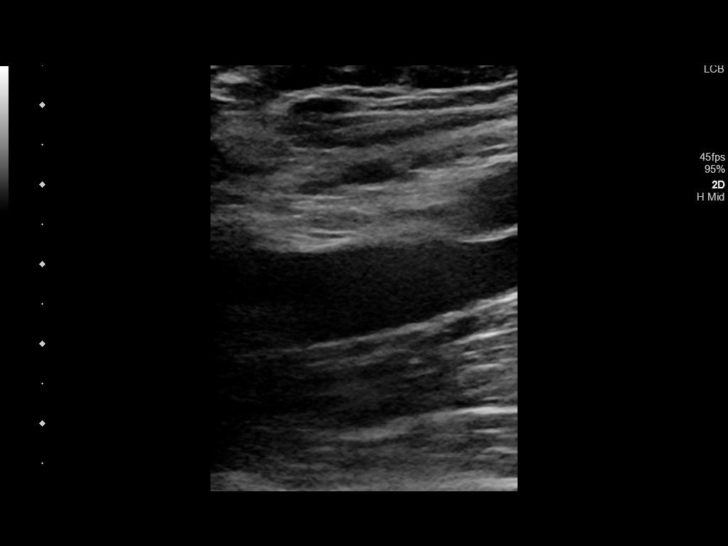
[im 6/34]
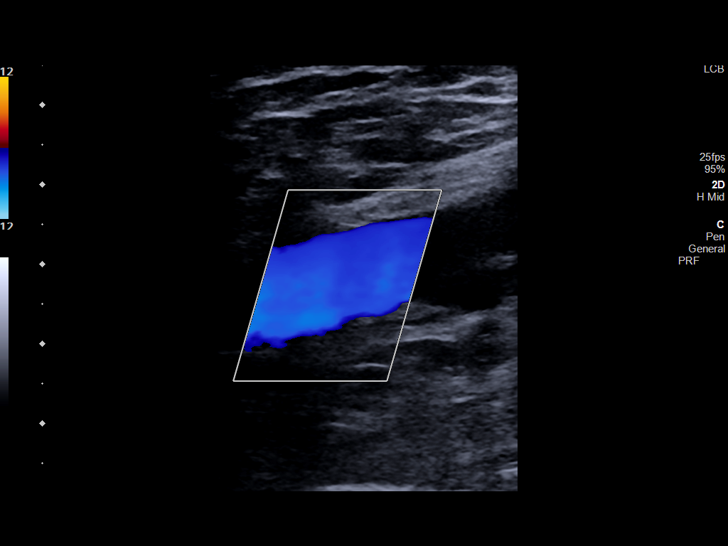
[im 9/34]
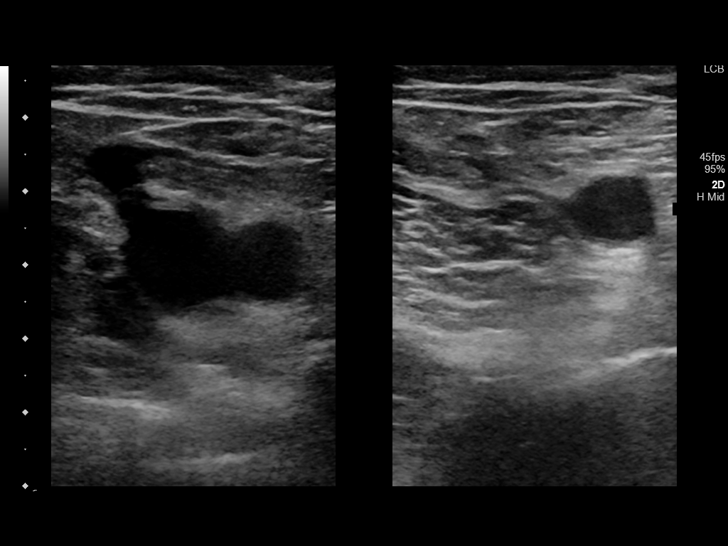
[im 12/34]
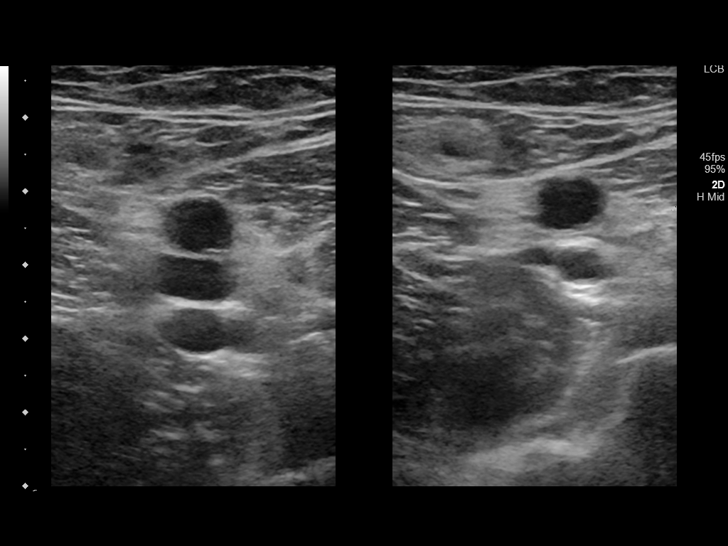
[im 15/34]
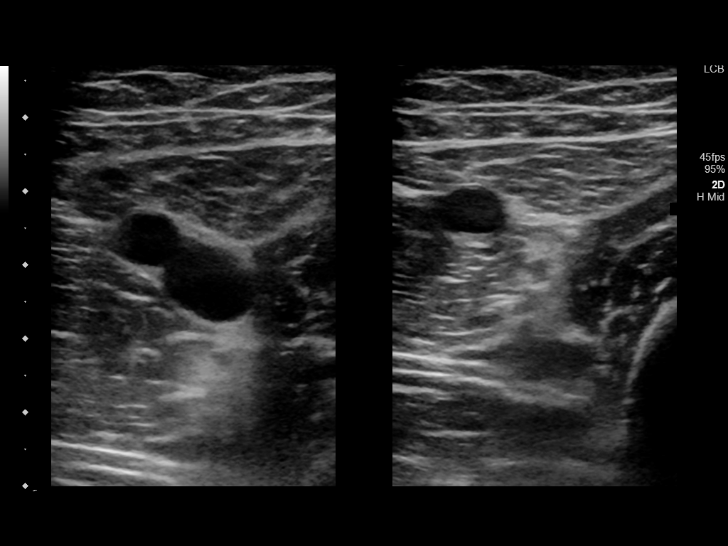
[im 18/34]
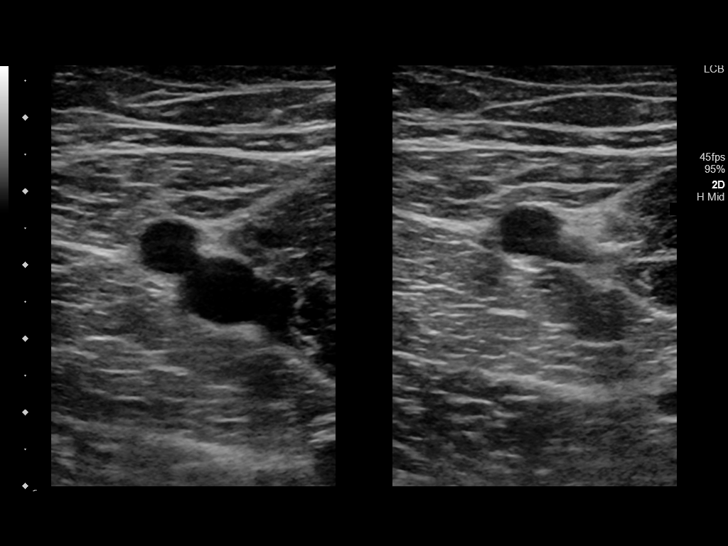
[im 19/34]
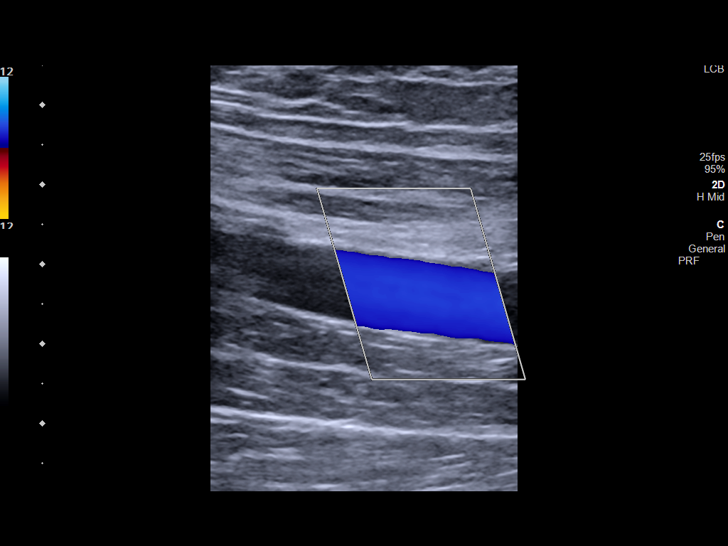
[im 22/34]
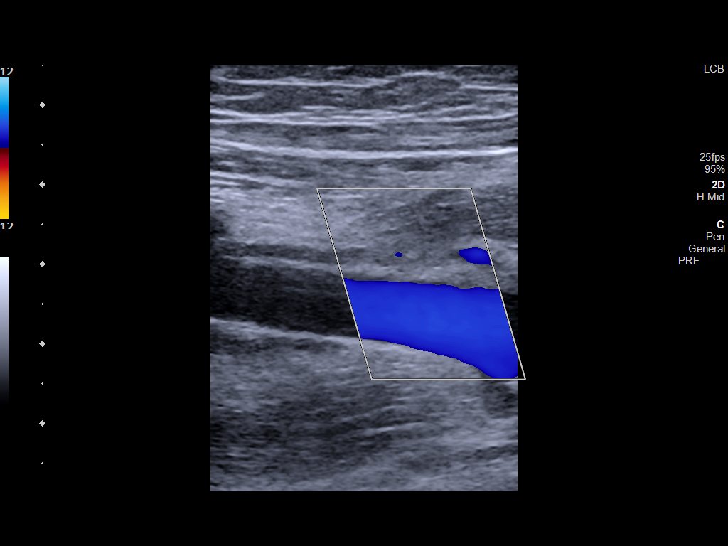
[im 25/34]
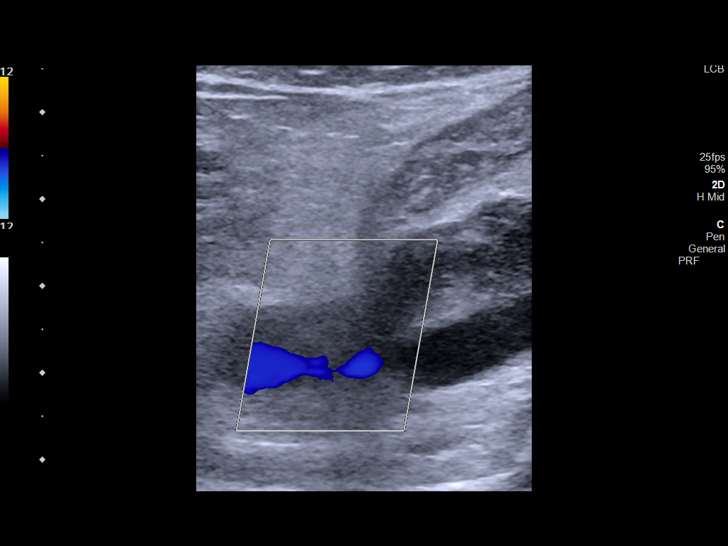
[im 28/34]
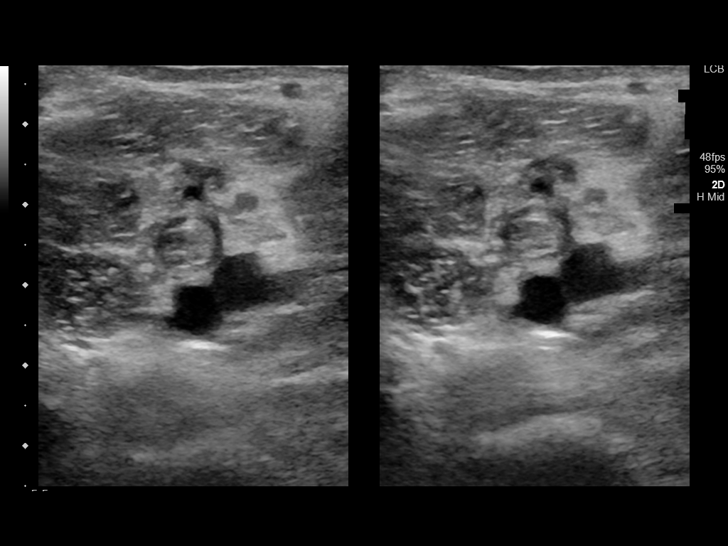
[im 31/34]
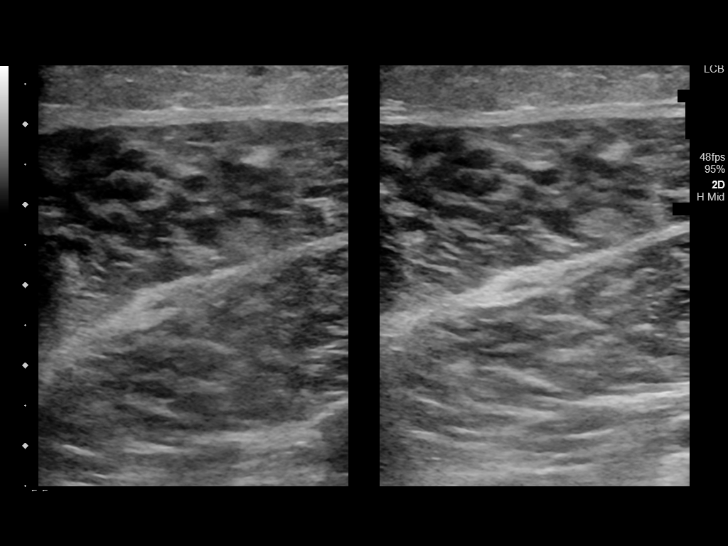
[im 34/34]
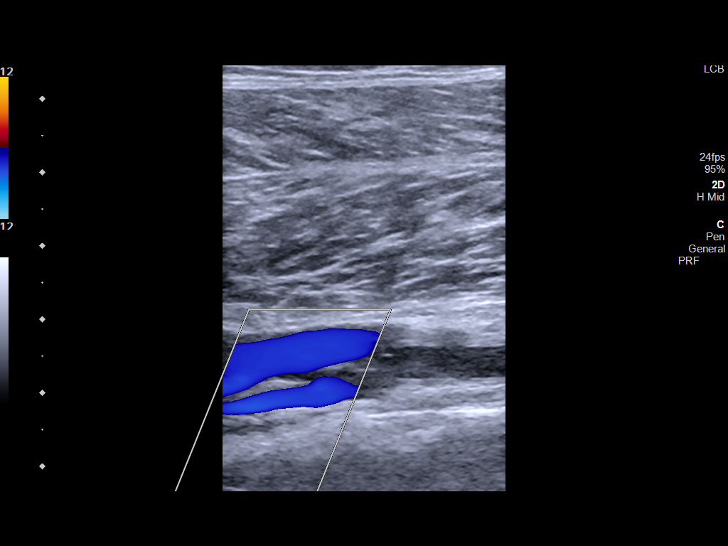

[13 of 24 positions shown; findings below may reference images not displayed]

FINDINGS: Contralateral Common Femoral Vein: Respiratory phasicity is normal
and symmetric with the symptomatic side. No evidence of thrombus.
Normal compressibility.

Common Femoral Vein: No evidence of thrombus. Normal
compressibility, respiratory phasicity and response to augmentation.

Saphenofemoral Junction: No evidence of thrombus. Normal
compressibility and flow on color Doppler imaging.

Profunda Femoral Vein: No evidence of thrombus. Normal
compressibility and flow on color Doppler imaging.

Femoral Vein: No evidence of thrombus. Normal compressibility,
respiratory phasicity and response to augmentation.

Popliteal Vein: Age-indeterminate mixed echogenic occlusive thrombus
involving the left popliteal vein (images 27 and 30).

Calf Veins: There is hypoechoic expansile thrombus involving both
paired left posterior tibial veins (image 33). Both paired peroneal
veins appear patent where imaged.

Superficial Great Saphenous Vein: No evidence of thrombus. Normal
compressibility.

Other Findings:  None.
IMPRESSION: Examination is positive for age-indeterminate occlusive DVT
involving the left popliteal vein extending to involve both paired
left posterior tibial veins. In the absence of prior examinations,
an acute on chronic process is not excluded. Clinical correlation is
advised.

## 2020-07-04 ENCOUNTER — Encounter: Payer: Self-pay | Admitting: Family Medicine

## 2020-09-04 ENCOUNTER — Other Ambulatory Visit: Payer: PPO

## 2020-09-04 ENCOUNTER — Other Ambulatory Visit: Payer: Self-pay

## 2020-09-04 DIAGNOSIS — R972 Elevated prostate specific antigen [PSA]: Secondary | ICD-10-CM | POA: Diagnosis not present

## 2020-09-05 LAB — PSA: Prostate Specific Ag, Serum: 5.1 ng/mL — ABNORMAL HIGH (ref 0.0–4.0)

## 2020-09-06 ENCOUNTER — Encounter: Payer: Self-pay | Admitting: Urology

## 2020-09-06 ENCOUNTER — Ambulatory Visit: Payer: PPO | Admitting: Urology

## 2020-09-26 ENCOUNTER — Encounter: Payer: Self-pay | Admitting: Urology

## 2020-09-26 ENCOUNTER — Other Ambulatory Visit: Payer: Self-pay

## 2020-09-26 ENCOUNTER — Ambulatory Visit (INDEPENDENT_AMBULATORY_CARE_PROVIDER_SITE_OTHER): Payer: PPO | Admitting: Urology

## 2020-09-26 VITALS — BP 152/91 | HR 60

## 2020-09-26 DIAGNOSIS — R972 Elevated prostate specific antigen [PSA]: Secondary | ICD-10-CM | POA: Diagnosis not present

## 2020-09-26 DIAGNOSIS — R6889 Other general symptoms and signs: Secondary | ICD-10-CM | POA: Diagnosis not present

## 2020-09-26 NOTE — Progress Notes (Signed)
09/26/2020 10:34 AM   William Shelton [birth year] [id number]  Referring provider: Ria Bush, MD [address] [place],  [place] [address]  Chief Complaint  Patient presents with  . Elevated PSA    HPI: 70 year old male with a personal history of elevated/fluctuating PSA returns today for routine annual follow-up.    He reports that his urinary symptoms are stable.  He gets up 0-3 times a night to void.  He feels like his stream is reasonably good and he empties well.  He has no complaints today.  He takes no BPH medications.  No other changes in his past medical history.  5.1 on 09/04/20 6.1 on 03/01/20 6.1 on 08/30/19 5.5 on 03/11/19 6.6 on 08/21/2018 4.7 On 08/20/2017 6.0 on 02/17/17  6.6 on 10/11/16 -->MRI 10/2016 negative, no high grade lesions 6.7 on 03/25/16 4.95 on 10/06/15 5.8 on 10/16 4.20 October 2014 -->negative prostate biopsy (unable to pull up records, TRUS volume unknown) 4.22 July 2014 2.04 2014 4.25 2013  Background history: He has a significant family history of prostate cancer in his father, grandfather, great-grandfather. His father was diagnosed in his 53s with prostate cancer and died at age 36 of other etiologies.   PMH: Past Medical History:  Diagnosis Date  . Allergic rhinitis, cause unspecified   . Arthritis   . BPH (benign prostatic hypertrophy)   . BPV (benign positional vertigo) 09/28/2015  . Chronic kidney disease (CKD), stage III (moderate) ([facility]) 10/22/2011  . Clotting disorder ([facility]) 09/2019   left leg DVT  . ED (erectile dysfunction)   . Elevated prostate specific antigen (PSA) 2016   eval by Dr Erlene Quan - s/p benign biopsy   . Essential hypertension 08/04/2014  . Hematuria, microscopic   . HLD (hyperlipidemia)   . Hydrocele, left   . IBS (irritable bowel syndrome)   . Internal hemorrhoid     Surgical History: Past Surgical History:  Procedure Laterality Date  . COLONOSCOPY  07/12/08   Internal  hemorrhoids---Dr. Fuller Plan  . INGUINAL HERNIA REPAIR  07/21/02   Left---Dr. Bary Castilla    Home Medications:  Allergies as of 09/26/2020   No Known Allergies     Medication List       Accurate as of September 26, 2020 10:34 AM. If you have any questions, ask your nurse or doctor.        amLODipine 10 MG tablet Commonly known as: NORVASC TAKE 1 TABLET BY MOUTH EVERY DAY   EPINEPHrine 0.3 mg/0.3 mL Soaj injection Commonly known as: EPI-PEN Inject 0.3 mLs (0.3 mg total) into the muscle once.   FISH OIL PO Take 1 capsule by mouth daily.   multivitamin capsule Take 1 capsule by mouth daily.   Vitamin D 50 MCG (2000 UT) Caps Take 1 capsule (2,000 Units total) by mouth daily.       Allergies: No Known Allergies  Family History: Family History  Problem Relation Age of Onset  . Hypertension Father   . Prostate cancer Father 40       Radiation seeds  . Macular degeneration Father   . Hyperlipidemia Brother   . Other Brother        BPH  . Diabetes Neg Hx   . Breast cancer Neg Hx   . Colon cancer Neg Hx   . Depression Neg Hx   . Alcohol abuse Neg Hx   . Stroke Neg Hx   . CAD Neg Hx     Social History:  reports that  he has never smoked. He has never used smokeless tobacco. He reports that he does not drink alcohol and does not use drugs.   Physical Exam: BP (!) 152/91   Pulse 60   Constitutional:  Alert and oriented, No acute distress. HEENT: [place] AT, moist mucus membranes.  Trachea midline, no masses. Cardiovascular: No clubbing, cyanosis, or edema. Respiratory: Normal respiratory effort, no increased work of breathing. GI: Abdomen is soft, nontender, nondistended, no abdominal masses Rectal: Normal sphincter tone.  Small external hemorrhoid appreciated.  50 cc prostate with some mild induration of the right lateral ridge, based on review of previous note (12/20), likely similar and unchanged Skin: No rashes, bruises or suspicious lesions. Neurologic: Grossly intact, no  focal deficits, moving all 4 extremities. Psychiatric: Normal mood and affect.  Assessment & Plan:    1. Elevated PSA Long history of elevated/fluctuating PSA  Overall, his PSA has essentially stabilized.  He has a slightly abnormal rectal exam however he has had prostate biopsy/prostate MRI, long history of surveillance which have been stable.    At this point, we discussed transition to annual PSA/DRE.  He is comfortable with this plan.  2. Abnormal digital rectal exam Slightly abnormal exam with firm lateral ridge, noted on previous exams with downward trending PSA which is reassuring as above  Follow-up in 1 year with PSA/DRE  [name], MD  [facility] [address], [place], [place] [address] [phone]

## 2021-02-08 ENCOUNTER — Telehealth: Payer: Self-pay | Admitting: Family Medicine

## 2021-02-08 NOTE — Telephone Encounter (Signed)
E-scribed refill.  Plz schedule wellness, lab and cpe visits after 03/14/21.

## 2021-02-09 NOTE — Telephone Encounter (Signed)
Noted.  FYI to Dr. G. 

## 2021-02-09 NOTE — Telephone Encounter (Signed)
Pt returned call. He stated that he is not going to make an appointment at this time. He said it is his decision when to make an appointment and that he does not want [place] calling him for that. He said when he wants to have an appointment, he will call us.

## 2021-02-09 NOTE — Telephone Encounter (Signed)
Called and left vm for the patient to call office ( no details left) . Home phone # is not accepting calls at this time. Left vm on cell phone. Needs to schedule cpe, [facility], and labs after 03/14/21 . EM

## 2021-02-09 NOTE — Telephone Encounter (Addendum)
He is on blood pressure medication. It is good medical care to be seen at least once a year for monitoring of chronic medical conditions including hypertension.  I last saw him 09/2019 so he is overdue for this.  If he doesn't want to schedule CPE, he should at least schedule HTN f/u visit.

## 2021-02-13 NOTE — Telephone Encounter (Signed)
Noted  

## 2021-02-13 NOTE — Telephone Encounter (Signed)
Patient is scheduled EM 

## 2021-04-15 ENCOUNTER — Encounter: Payer: Self-pay | Admitting: Family Medicine

## 2021-04-16 NOTE — Telephone Encounter (Addendum)
I didn't see the 2nd page.  Updated pt's chart with 4th dose.

## 2021-04-16 NOTE — Telephone Encounter (Signed)
According to pt's chart, this was his 3rd dose, which we already have.

## 2021-04-19 ENCOUNTER — Ambulatory Visit: Payer: PPO

## 2021-04-26 ENCOUNTER — Other Ambulatory Visit: Payer: Self-pay | Admitting: Family Medicine

## 2021-04-26 DIAGNOSIS — N1831 Chronic kidney disease, stage 3a: Secondary | ICD-10-CM

## 2021-04-26 DIAGNOSIS — R972 Elevated prostate specific antigen [PSA]: Secondary | ICD-10-CM

## 2021-04-26 DIAGNOSIS — E785 Hyperlipidemia, unspecified: Secondary | ICD-10-CM

## 2021-04-26 DIAGNOSIS — E559 Vitamin D deficiency, unspecified: Secondary | ICD-10-CM

## 2021-04-27 ENCOUNTER — Other Ambulatory Visit: Payer: Self-pay

## 2021-04-27 ENCOUNTER — Other Ambulatory Visit (INDEPENDENT_AMBULATORY_CARE_PROVIDER_SITE_OTHER): Payer: PPO

## 2021-04-27 DIAGNOSIS — R972 Elevated prostate specific antigen [PSA]: Secondary | ICD-10-CM | POA: Diagnosis not present

## 2021-04-27 DIAGNOSIS — E559 Vitamin D deficiency, unspecified: Secondary | ICD-10-CM

## 2021-04-27 DIAGNOSIS — N1831 Chronic kidney disease, stage 3a: Secondary | ICD-10-CM

## 2021-04-27 DIAGNOSIS — E785 Hyperlipidemia, unspecified: Secondary | ICD-10-CM

## 2021-04-27 LAB — CBC WITH DIFFERENTIAL/PLATELET
Basophils Absolute: 0.1 10*3/uL (ref 0.0–0.1)
Basophils Relative: 0.9 % (ref 0.0–3.0)
Eosinophils Absolute: 0.5 10*3/uL (ref 0.0–0.7)
Eosinophils Relative: 8.5 % — ABNORMAL HIGH (ref 0.0–5.0)
HCT: 44.2 % (ref 39.0–52.0)
Hemoglobin: 14.3 g/dL (ref 13.0–17.0)
Lymphocytes Relative: 35.5 % (ref 12.0–46.0)
Lymphs Abs: 2 10*3/uL (ref 0.7–4.0)
MCHC: 32.4 g/dL (ref 30.0–36.0)
MCV: 91.3 fl (ref 78.0–100.0)
Monocytes Absolute: 0.6 10*3/uL (ref 0.1–1.0)
Monocytes Relative: 10.6 % (ref 3.0–12.0)
Neutro Abs: 2.5 10*3/uL (ref 1.4–7.7)
Neutrophils Relative %: 44.5 % (ref 43.0–77.0)
Platelets: 225 10*3/uL (ref 150.0–400.0)
RBC: 4.84 Mil/uL (ref 4.22–5.81)
RDW: 13.9 % (ref 11.5–15.5)
WBC: 5.7 10*3/uL (ref 4.0–10.5)

## 2021-04-27 LAB — LIPID PANEL
Cholesterol: 235 mg/dL — ABNORMAL HIGH (ref 0–200)
HDL: 54.4 mg/dL (ref >39.00)
LDL Cholesterol: 159 mg/dL — ABNORMAL HIGH (ref 0–99)
NonHDL: 180.57
Total CHOL/HDL Ratio: 4
Triglycerides: 110 mg/dL (ref 0.0–149.0)
VLDL: 22 mg/dL (ref 0.0–40.0)

## 2021-04-27 LAB — COMPREHENSIVE METABOLIC PANEL
ALT: 23 U/L (ref 0–53)
AST: 23 U/L (ref 0–37)
Albumin: 4.2 g/dL (ref 3.5–5.2)
Alkaline Phosphatase: 82 U/L (ref 39–117)
BUN: 18 mg/dL (ref 6–23)
CO2: 30 mEq/L (ref 19–32)
Calcium: 9.5 mg/dL (ref 8.4–10.5)
Chloride: 105 mEq/L (ref 96–112)
Creatinine, Ser: 1.3 mg/dL (ref 0.40–1.50)
GFR: 55.43 mL/min — ABNORMAL LOW (ref >60.00)
Glucose, Bld: 84 mg/dL (ref 70–99)
Potassium: 4.8 mEq/L (ref 3.5–5.1)
Sodium: 141 mEq/L (ref 135–145)
Total Bilirubin: 0.6 mg/dL (ref 0.2–1.2)
Total Protein: 6.9 g/dL (ref 6.0–8.3)

## 2021-04-27 LAB — PSA: PSA: 4.75 ng/mL — ABNORMAL HIGH (ref 0.10–4.00)

## 2021-04-27 LAB — MICROALBUMIN / CREATININE URINE RATIO
Creatinine,U: 263.4 mg/dL
Microalb Creat Ratio: 2.5 mg/g (ref 0.0–30.0)
Microalb, Ur: 6.5 mg/dL — ABNORMAL HIGH (ref 0.0–1.9)

## 2021-04-27 LAB — VITAMIN D 25 HYDROXY (VIT D DEFICIENCY, FRACTURES): VITD: 55.77 ng/mL (ref 30.00–100.00)

## 2021-04-27 NOTE — Addendum Note (Signed)
Addended by: Ellamae Sia on: 04/27/2021 12:33 PM   Modules accepted: Orders

## 2021-04-28 LAB — PARATHYROID HORMONE, INTACT (NO CA): PTH: 31 pg/mL (ref 16–77)

## 2021-05-03 ENCOUNTER — Ambulatory Visit: Payer: PPO

## 2021-05-04 ENCOUNTER — Encounter: Payer: Self-pay | Admitting: Family Medicine

## 2021-05-04 ENCOUNTER — Ambulatory Visit (INDEPENDENT_AMBULATORY_CARE_PROVIDER_SITE_OTHER): Payer: PPO | Admitting: Family Medicine

## 2021-05-04 ENCOUNTER — Other Ambulatory Visit: Payer: Self-pay

## 2021-05-04 VITALS — BP 124/84 | HR 64 | Temp 97.1°F | Ht 71.5 in | Wt 177.0 lb

## 2021-05-04 DIAGNOSIS — E7800 Pure hypercholesterolemia, unspecified: Secondary | ICD-10-CM

## 2021-05-04 DIAGNOSIS — Z86718 Personal history of other venous thrombosis and embolism: Secondary | ICD-10-CM

## 2021-05-04 DIAGNOSIS — Z7189 Other specified counseling: Secondary | ICD-10-CM

## 2021-05-04 DIAGNOSIS — Z0000 Encounter for general adult medical examination without abnormal findings: Secondary | ICD-10-CM | POA: Diagnosis not present

## 2021-05-04 DIAGNOSIS — R972 Elevated prostate specific antigen [PSA]: Secondary | ICD-10-CM

## 2021-05-04 DIAGNOSIS — H903 Sensorineural hearing loss, bilateral: Secondary | ICD-10-CM

## 2021-05-04 DIAGNOSIS — E559 Vitamin D deficiency, unspecified: Secondary | ICD-10-CM

## 2021-05-04 DIAGNOSIS — Z1211 Encounter for screening for malignant neoplasm of colon: Secondary | ICD-10-CM

## 2021-05-04 DIAGNOSIS — N1831 Chronic kidney disease, stage 3a: Secondary | ICD-10-CM

## 2021-05-04 DIAGNOSIS — I10 Essential (primary) hypertension: Secondary | ICD-10-CM

## 2021-05-04 DIAGNOSIS — D721 Eosinophilia, unspecified: Secondary | ICD-10-CM

## 2021-05-04 MED ORDER — AMLODIPINE BESYLATE 10 MG PO TABS: 10.0000 mg | ORAL_TABLET | Freq: Every day | ORAL | 3 refills | Status: DC

## 2021-05-04 NOTE — Assessment & Plan Note (Signed)
Reviewed kidney function - continue to monitor.

## 2021-05-04 NOTE — Assessment & Plan Note (Signed)
Chronic, LDL elevated. Reviewed diet choices to improve levels. Reviewed ASCVD risk - not interested in statin at this time. Will focus on diet changes over the next 12 months and reassess control at next visit   The 10-year ASCVD risk score Mikey Bussing DC Brooke Bonito., et al., 2013) is: 22%   Values used to calculate the score:     Age: 70 years     Sex: Male     Is Non-Hispanic African American: No     Diabetic: No     Tobacco smoker: No     Systolic Blood Pressure: A999333 mmHg     Is BP treated: Yes     HDL Cholesterol: 54.4 mg/dL     Total Cholesterol: 235 mg/dL

## 2021-05-04 NOTE — Assessment & Plan Note (Signed)
Well replaced with oral supplement.

## 2021-05-04 NOTE — Assessment & Plan Note (Addendum)
Completed 4 months of eliquis and had negative hypercoagulable panel by heme Rogue Bussing)

## 2021-05-04 NOTE — Progress Notes (Signed)
Patient ID: William Shelton, male    DOB: Feb 17, 1951, 70 y.o.   MRN: [id number]  This visit was conducted in person.  BP 124/84   Pulse 64   Temp (!) 97.1 F (36.2 C) (Temporal)   Ht 5' 11.5" (1.816 m)   Wt 177 lb (80.3 kg)   SpO2 99%   BMI 24.34 kg/m    CC: AMW/CPE Subjective:   HPI: William Shelton is a 70 y.o. male presenting on 05/04/2021 for Medicare Wellness   Did not see health advisor this year.   Hearing Screening - Comments:: Wears bilateral hearing aids.  Wearing at today's OV.  Vision Screening - Comments:: Last eye exam, 04/2020.  [facility] Office Visit from 05/04/2021 in [facility] at [facility]  PHQ-2 Total Score 0       Fall Risk  05/04/2021 03/14/2020 10/01/2019 11/10/2018 11/05/2017  Falls in the past year? 0 0 0 0 No  Number falls in past yr: - 0 - - -  Injury with Fall? - 0 - - -  Risk for fall due to : - Medication side effect - - -  Follow up - Falls evaluation completed;Falls prevention discussed - - -   LLE venous US 09/2019 - age-indeterminate occlusive DVT involving the left popliteal vein extending to involve both paired left posterior tibial veins. Started on eliquis '5mg'$  bid 09/24/2019. Saw hematology in f/u - completed 3 mo eliquis treatment. Hypercoagulable workup negative.    Preventative: COLONOSCOPY Date: 07/12/08 Internal hemorrhoids Fuller Plan). Requests rpt colonoscopy.   Prostate cancer screening - elevated PSA followed by Dr Erlene Quan last seen 09/2020. h/o low risk velocity. Biopsy normal 12/2014. Prostate MRI reassuring 10/2016. + fmhx father with prostate cancer dx age 71s, controlled with seeds.  Lung cancer screening - not eligible Flu shot yearly  COVID vaccine - Pfizer 10/2019, 11/2019, booster 06/2020, 03/2021 Prevnar 09/2015, pneumovax 2018  Td 2009  zostavax - 07/2014  shingrix - discussed  Advanced directive: has at home. HCPOA would be wife. Asked to bring copy to update chart. Seat belt use discussed Sunscreen use  discussed. No changing moles on skin Nonsmoker  Alcohol - rare  Dentist q6 mo  Eye exam yearly  Bowel - no constipation  Bladder - no incontinence    Married; lives with wife  1 son  Retired Surveyor, mining  Activity: walks daily 5-10 miles daily, minimum 3 miles.  Diet: good water, fruits/vegetables daily      Relevant past medical, surgical, family and social history reviewed and updated as indicated. Interim medical history since our last visit reviewed. Allergies and medications reviewed and updated. Outpatient Medications Prior to Visit  Medication Sig Dispense Refill   Cholecalciferol (VITAMIN D) 2000 units CAPS Take 1 capsule (2,000 Units total) by mouth daily. 30 capsule    EPINEPHrine 0.3 mg/0.3 mL IJ SOAJ injection Inject 0.3 mLs (0.3 mg total) into the muscle once. 1 Device 1   Multiple Vitamin (MULTIVITAMIN) capsule Take 1 capsule by mouth daily.     Omega-3 Fatty Acids (FISH OIL PO) Take 1 capsule by mouth daily.     amLODipine (NORVASC) 10 MG tablet TAKE 1 TABLET BY MOUTH EVERY DAY 90 tablet 0   No facility-administered medications prior to visit.     Per HPI unless specifically indicated in ROS section below Review of Systems  Constitutional:  Negative for activity change, appetite change, chills, fatigue, fever and unexpected weight change.  HENT:  Negative for hearing  loss.   Eyes:  Negative for visual disturbance.  Respiratory:  Negative for cough, chest tightness, shortness of breath and wheezing.   Cardiovascular:  Negative for chest pain, palpitations and leg swelling.  Gastrointestinal:  Negative for abdominal distention, abdominal pain, blood in stool, constipation, diarrhea, nausea and vomiting.  Genitourinary:  Negative for difficulty urinating and hematuria.  Musculoskeletal:  Negative for arthralgias, myalgias and neck pain.  Skin:  Negative for rash.  Neurological:  Negative for dizziness, seizures, syncope and headaches.   Hematological:  Negative for adenopathy. Does not bruise/bleed easily.  Psychiatric/Behavioral:  Negative for dysphoric mood. The patient is not nervous/anxious.    Objective:  BP 124/84   Pulse 64   Temp (!) 97.1 F (36.2 C) (Temporal)   Ht 5' 11.5" (1.816 m)   Wt 177 lb (80.3 kg)   SpO2 99%   BMI 24.34 kg/m   Wt Readings from Last 3 Encounters:  05/04/21 177 lb (80.3 kg)  03/14/20 180 lb (81.6 kg)  10/06/19 183 lb (83 kg)      Physical Exam Vitals and nursing note reviewed.  Constitutional:      General: He is not in acute distress.    Appearance: Normal appearance. He is well-developed. He is not ill-appearing.  HENT:     Head: Normocephalic and atraumatic.     Right Ear: Hearing, tympanic membrane, ear canal and external ear normal.     Left Ear: Hearing, tympanic membrane, ear canal and external ear normal.  Eyes:     General: No scleral icterus.    Extraocular Movements: Extraocular movements intact.     Conjunctiva/sclera: Conjunctivae normal.     Pupils: Pupils are equal, round, and reactive to light.  Neck:     Thyroid: No thyroid mass or thyromegaly.     Vascular: No carotid bruit.  Cardiovascular:     Rate and Rhythm: Normal rate and regular rhythm.     Pulses: Normal pulses.          Radial pulses are 2+ on the right side and 2+ on the left side.     Heart sounds: Normal heart sounds. No murmur heard. Pulmonary:     Effort: Pulmonary effort is normal. No respiratory distress.     Breath sounds: Normal breath sounds. No wheezing, rhonchi or rales.  Abdominal:     General: Bowel sounds are normal. There is no distension.     Palpations: Abdomen is soft. There is no mass.     Tenderness: There is no abdominal tenderness. There is no guarding or rebound.     Hernia: No hernia is present.  Musculoskeletal:        General: Normal range of motion.     Cervical back: Normal range of motion and neck supple.     Right lower leg: No edema.     Left lower leg:  No edema.  Lymphadenopathy:     Cervical: No cervical adenopathy.  Skin:    General: Skin is warm and dry.     Findings: No rash.  Neurological:     General: No focal deficit present.     Mental Status: He is alert and oriented to person, place, and time.     Comments:  Recall 3/3 Calculation 5/5 DLROW  Psychiatric:        Mood and Affect: Mood normal.        Behavior: Behavior normal.        Thought Content: Thought content normal.  Judgment: Judgment normal.      Results for orders placed or performed in visit on 04/27/21  VITAMIN D 25 Hydroxy (Vit-D Deficiency, Fractures)  Result Value Ref Range   VITD 55.77 30.00 - 100.00 ng/mL  CBC with Differential/Platelet  Result Value Ref Range   WBC 5.7 4.0 - 10.5 K/uL   RBC 4.84 4.22 - 5.81 Mil/uL   Hemoglobin 14.3 13.0 - 17.0 g/dL   HCT 44.2 39.0 - 52.0 %   MCV 91.3 78.0 - 100.0 fl   MCHC 32.4 30.0 - 36.0 g/dL   RDW 13.9 11.5 - 15.5 %   Platelets 225.0 150.0 - 400.0 K/uL   Neutrophils Relative % 44.5 43.0 - 77.0 %   Lymphocytes Relative 35.5 12.0 - 46.0 %   Monocytes Relative 10.6 3.0 - 12.0 %   Eosinophils Relative 8.5 (H) 0.0 - 5.0 %   Basophils Relative 0.9 0.0 - 3.0 %   Neutro Abs 2.5 1.4 - 7.7 K/uL   Lymphs Abs 2.0 0.7 - 4.0 K/uL   Monocytes Absolute 0.6 0.1 - 1.0 K/uL   Eosinophils Absolute 0.5 0.0 - 0.7 K/uL   Basophils Absolute 0.1 0.0 - 0.1 K/uL  Parathyroid hormone, intact (no Ca)  Result Value Ref Range   PTH 31 16 - 77 pg/mL  PSA  Result Value Ref Range   PSA 4.75 (H) 0.10 - 4.00 ng/mL  Lipid panel  Result Value Ref Range   Cholesterol 235 (H) 0 - 200 mg/dL   Triglycerides 110.0 0.0 - 149.0 mg/dL   HDL 54.40 >39.00 mg/dL   VLDL 22.0 0.0 - 40.0 mg/dL   LDL Cholesterol 159 (H) 0 - 99 mg/dL   Total CHOL/HDL Ratio 4    NonHDL 180.57   Comprehensive metabolic panel  Result Value Ref Range   Sodium 141 135 - 145 mEq/L   Potassium 4.8 3.5 - 5.1 mEq/L   Chloride 105 96 - 112 mEq/L   CO2 30 19 - 32  mEq/L   Glucose, Bld 84 70 - 99 mg/dL   BUN 18 6 - 23 mg/dL   Creatinine, Ser 1.30 0.40 - 1.50 mg/dL   Total Bilirubin 0.6 0.2 - 1.2 mg/dL   Alkaline Phosphatase 82 39 - 117 U/L   AST 23 0 - 37 U/L   ALT 23 0 - 53 U/L   Total Protein 6.9 6.0 - 8.3 g/dL   Albumin 4.2 3.5 - 5.2 g/dL   GFR 55.43 (L) >60.00 mL/min   Calcium 9.5 8.4 - 10.5 mg/dL  Microalbumin / creatinine urine ratio  Result Value Ref Range   Microalb, Ur 6.5 (H) 0.0 - 1.9 mg/dL   Creatinine,U 263.4 mg/dL   Microalb Creat Ratio 2.5 0.0 - 30.0 mg/g    Assessment & Plan:  This visit occurred during the SARS-CoV-2 public health emergency.  Safety protocols were in place, including screening questions prior to the visit, additional usage of staff PPE, and extensive cleaning of exam room while observing appropriate contact time as indicated for disinfecting solutions.   Problem List Items Addressed This Visit     Health maintenance examination (Chronic)    Preventative protocols reviewed and updated unless pt declined. Discussed healthy diet and lifestyle.       Medicare annual wellness visit, subsequent - Primary (Chronic)    I have personally reviewed the Medicare Annual Wellness questionnaire and have noted 1. The patient's medical and social history 2. Their use of alcohol, tobacco or illicit drugs 3. Their current  medications and supplements 4. The patient's functional ability including ADL's, fall risks, home safety risks and hearing or visual impairment. Cognitive function has been assessed and addressed as indicated.  5. Diet and physical activity 6. Evidence for depression or mood disorders The patients weight, height, BMI have been recorded in the chart. I have made referrals, counseling and provided education to the patient based on review of the above and I have provided the pt with a written personalized care plan for preventive services. Provider list updated.. See scanned questionairre as needed for  further documentation. Reviewed preventative protocols and updated unless pt declined.       Advanced care planning/counseling discussion (Chronic)    Advanced directive: has at home. HCPOA would be wife. Asked to bring copy to update chart.      HLD (hyperlipidemia)    Chronic, LDL elevated. Reviewed diet choices to improve levels. Reviewed ASCVD risk - not interested in statin at this time. Will focus on diet changes over the next 12 months and reassess control at next visit   The 10-year ASCVD risk score Mikey Bussing DC Brooke Bonito., et al., 2013) is: 22%   Values used to calculate the score:     Age: 76 years     Sex: Male     Is Non-Hispanic African American: No     Diabetic: No     Tobacco smoker: No     Systolic Blood Pressure: A999333 mmHg     Is BP treated: Yes     HDL Cholesterol: 54.4 mg/dL     Total Cholesterol: 235 mg/dL       Relevant Medications   amLODipine (NORVASC) 10 MG tablet   Elevated prostate specific antigen (PSA)    Presumed BPH related - notes nocturia x2.  Followed by urology - appreciate Dr Cherrie Gauze care.       Chronic kidney disease (CKD), stage III (moderate) ([facility])    Reviewed kidney function - continue to monitor.       History of DVT of lower extremity    Completed 4 months of eliquis and had negative hypercoagulable panel by heme Rogue Bussing)      Essential hypertension    Chronic, stable on amlodipine - continue.       Relevant Medications   amLODipine (NORVASC) 10 MG tablet   Vitamin D deficiency    Well replaced with oral supplement.       Eosinophilia    Mild, ongoing.  Denies significant allergies. No concerns for parasite.  Will continue to monitor, consider periph smear next labwork.      Bilateral sensorineural hearing loss    Continue hearing aide use.       Other Visit Diagnoses     Special screening for malignant neoplasms, colon       Relevant Orders   Ambulatory referral to Gastroenterology        Meds ordered this  encounter  Medications   amLODipine (NORVASC) 10 MG tablet    Sig: Take 1 tablet (10 mg total) by mouth daily.    Dispense:  90 tablet    Refill:  3   Orders Placed This Encounter  Procedures   Ambulatory referral to Gastroenterology    Referral Priority:   Routine    Referral Type:   Consultation    Referral Reason:   Specialty Services Required    Number of Visits Requested:   1     Patient instructions: We will refer you back to see Dr Silvio Pate'  office for colonoscopy.  If interested, check with pharmacy about new 2 shot shingles series (shingrix).  Send [place] copy of your advanced directive.  You are doing well today Return as needed or in 1 year for next physical/wellness visit.   Follow up plan: Return in about 1 year (around 05/04/2022) for annual exam, prior fasting for blood work, medicare wellness visit.  Ria Bush, MD

## 2021-05-04 NOTE — Assessment & Plan Note (Signed)

## 2021-05-04 NOTE — Assessment & Plan Note (Signed)
Mild, ongoing.  Denies significant allergies. No concerns for parasite.  Will continue to monitor, consider periph smear next labwork.

## 2021-05-04 NOTE — Assessment & Plan Note (Signed)
Preventative protocols reviewed and updated unless pt declined. Discussed healthy diet and lifestyle.  

## 2021-05-04 NOTE — Assessment & Plan Note (Signed)
Advanced directive: has at home. HCPOA would be wife. Asked to bring copy to update chart. 

## 2021-05-04 NOTE — Assessment & Plan Note (Signed)
Presumed BPH related - notes nocturia x2.  Followed by urology - appreciate Dr Cherrie Gauze care.

## 2021-05-04 NOTE — Assessment & Plan Note (Signed)
Chronic, stable on amlodipine - continue.  

## 2021-05-04 NOTE — Assessment & Plan Note (Signed)
Continue hearing aide use.

## 2021-05-04 NOTE — Patient Instructions (Addendum)
We will refer you back to see Dr Silvio Pate' office for colonoscopy.  If interested, check with pharmacy about new 2 shot shingles series (shingrix).  Send [place] copy of your advanced directive.  You are doing well today Return as needed or in 1 year for next physical/wellness visit.   Health Maintenance After Age 71 After age 59, you are at a higher risk for certain long-term diseases and infections as well as injuries from falls. Falls are a major cause of broken bones and head injuries in people who are older than age 61. Getting regular preventive care can help to keep you healthy and well. Preventive care includes getting regular testing and making lifestyle changes as recommended by your health care provider. Talk with your health care provider about: Which screenings and tests you should have. A screening is a test that checks for a disease when you have no symptoms. A diet and exercise plan that is right for you. What should I know about screenings and tests to prevent falls? Screening and testing are the best ways to find a health problem early. Early diagnosis and treatment give you the best chance of managing medical conditions that are common after age 21. Certain conditions and lifestyle choices may make you more likely to have a fall. Your health care provider may recommend: Regular vision checks. Poor vision and conditions such as cataracts can make you more likely to have a fall. If you wear glasses, make sure to get your prescription updated if your vision changes. Medicine review. Work with your health care provider to regularly review all of the medicines you are taking, including over-the-counter medicines. Ask your health care provider about any side effects that may make you more likely to have a fall. Tell your health care provider if any medicines that you take make you feel dizzy or sleepy. Osteoporosis screening. Osteoporosis is a condition that causes the bones to get weaker. This  can make the bones weak and cause them to break more easily. Blood pressure screening. Blood pressure changes and medicines to control blood pressure can make you feel dizzy. Strength and balance checks. Your health care provider may recommend certain tests to check your strength and balance while standing, walking, or changing positions. Foot health exam. Foot pain and numbness, as well as not wearing proper footwear, can make you more likely to have a fall. Depression screening. You may be more likely to have a fall if you have a fear of falling, feel emotionally low, or feel unable to do activities that you used to do. Alcohol use screening. Using too much alcohol can affect your balance and may make you more likely to have a fall. What actions can I take to lower my risk of falls? General instructions Talk with your health care provider about your risks for falling. Tell your health care provider if: You fall. Be sure to tell your health care provider about all falls, even ones that seem minor. You feel dizzy, sleepy, or off-balance. Take over-the-counter and prescription medicines only as told by your health care provider. These include any supplements. Eat a healthy diet and maintain a healthy weight. A healthy diet includes low-fat dairy products, low-fat (lean) meats, and fiber from whole grains, beans, and lots of fruits and vegetables. Home safety Remove any tripping hazards, such as rugs, cords, and clutter. Install safety equipment such as grab bars in bathrooms and safety rails on stairs. Keep rooms and walkways well-lit. Activity  Follow a  regular exercise program to stay fit. This will help you maintain your balance. Ask your health care provider what types of exercise are appropriate for you. If you need a cane or walker, use it as recommended by your health care provider. Wear supportive shoes that have nonskid soles.  Lifestyle Do not drink alcohol if your health care  provider tells you not to drink. If you drink alcohol, limit how much you have: 0-1 drink a day for women. 0-2 drinks a day for men. Be aware of how much alcohol is in your drink. In the [place] one drink equals one typical bottle of beer (12 oz), one-half glass of wine (5 oz), or one shot of hard liquor (1 oz). Do not use any products that contain nicotine or tobacco, such as cigarettes and e-cigarettes. If you need help quitting, ask your health care provider. Summary Having a healthy lifestyle and getting preventive care can help to protect your health and wellness after age 72. Screening and testing are the best way to find a health problem early and help you avoid having a fall. Early diagnosis and treatment give you the best chance for managing medical conditions that are more common for people who are older than age 70. Falls are a major cause of broken bones and head injuries in people who are older than age 16. Take precautions to prevent a fall at home. Work with your health care provider to learn what changes you can make to improve your health and wellness and to prevent falls. This information is not intended to replace advice given to you by your health care provider. Make sure you discuss any questions you have with your healthcare provider. Document Revised: 08/18/2020 Document Reviewed: 08/18/2020 Elsevier Patient Education  2022 [employer].

## 2021-05-14 DIAGNOSIS — D3132 Benign neoplasm of left choroid: Secondary | ICD-10-CM | POA: Diagnosis not present

## 2021-05-14 DIAGNOSIS — H5203 Hypermetropia, bilateral: Secondary | ICD-10-CM | POA: Diagnosis not present

## 2021-05-14 DIAGNOSIS — H524 Presbyopia: Secondary | ICD-10-CM | POA: Diagnosis not present

## 2021-05-14 DIAGNOSIS — D3131 Benign neoplasm of right choroid: Secondary | ICD-10-CM | POA: Diagnosis not present

## 2021-05-14 DIAGNOSIS — H2513 Age-related nuclear cataract, bilateral: Secondary | ICD-10-CM | POA: Diagnosis not present

## 2021-05-16 ENCOUNTER — Encounter: Payer: Self-pay | Admitting: Gastroenterology

## 2021-06-20 ENCOUNTER — Encounter: Payer: Self-pay | Admitting: Urology

## 2021-06-20 ENCOUNTER — Ambulatory Visit: Payer: PPO

## 2021-07-06 ENCOUNTER — Encounter: Payer: Self-pay | Admitting: Family Medicine

## 2021-07-06 NOTE — Telephone Encounter (Signed)
Updated pt's chart.  

## 2021-07-13 ENCOUNTER — Other Ambulatory Visit: Payer: Self-pay

## 2021-07-13 ENCOUNTER — Encounter: Payer: Self-pay | Admitting: Gastroenterology

## 2021-07-13 ENCOUNTER — Ambulatory Visit (AMBULATORY_SURGERY_CENTER): Payer: Self-pay | Admitting: *Deleted

## 2021-07-13 VITALS — Ht 72.0 in | Wt 180.0 lb

## 2021-07-13 DIAGNOSIS — Z1211 Encounter for screening for malignant neoplasm of colon: Secondary | ICD-10-CM

## 2021-07-13 MED ORDER — PEG 3350-KCL-NA BICARB-NACL 420 G PO SOLR: 4000.0000 mL | Freq: Once | ORAL | 0 refills | Status: AC

## 2021-07-13 NOTE — Progress Notes (Signed)
  No egg or soy allergy  No home oxygen use   No medications for weight loss taken  emmi information given  Pt denies constipation issues  Pt informed that we do not do prior authorizations for prep   No trouble with anesthesia, denies being told they were difficult to intubate, or hx/fam hx of malignant hyperthermia per pt

## 2021-07-23 ENCOUNTER — Encounter: Payer: Self-pay | Admitting: Family Medicine

## 2021-07-24 NOTE — Telephone Encounter (Signed)
Updated pt's chart.  

## 2021-07-30 ENCOUNTER — Encounter: Payer: PPO | Admitting: Gastroenterology

## 2021-08-26 ENCOUNTER — Encounter: Payer: Self-pay | Admitting: Family Medicine

## 2021-08-27 ENCOUNTER — Telehealth: Payer: Self-pay

## 2021-08-27 NOTE — Telephone Encounter (Signed)
Brenton Grills, CMA routed conversation to [place] Triage 19 minutes ago (2:03 AM)   Brenton Grills, CMA 21 minutes ago (9:15 AM)   Plz triage pt.      Note    William Shelton  [facility] Clinical Pool (supporting Ria Bush, MD) 18 hours ago (2:53 PM)   Hay Dr. [name] [name] DOB [birth year] February of 2022 I had a blood clot in my left leg and took Eliquis for a few months.   Right leg feels fine. Well.My left leg does not feel right again it is not swollen by much if at all, not hot, not sensitive to the touch but, hurts behind the knee and down through the calf. When I bend down it hurts like it is swollen like when I had the blood clot.  I can walk 3 miles with no problem but have pains later in the day.    I have stopped my daily 3 mile walks for a few days to see if got better and it seems to be a bit worse.   I have a colonoscopy scheduled for early January and would like this defined before then.    Help?   [name] [phone]

## 2021-08-27 NOTE — Telephone Encounter (Signed)
Plz triage pt.  °

## 2021-08-27 NOTE — Telephone Encounter (Signed)
I spoke with pt; starting one month ago pt had tightness in lt lower leg due to swelling. Pt said  dull pain back of lt calf that comes and goes. No redness but feels the same as when had blood clot in FEb.Pt has hx of blood clot in lt lower leg 10/2020; pt took Eliquis 2 - 3 months. No CP or SOB. Pt said the pain and swelling has worsened slightly over the past month. Pt refuses [facility] ED for eval and possible imaging. Pt said he is not going to wait 3 - 4 hrs at ED and wants appt in office. Pt scheduled in office appt on 08/28/21 at 53 Am with Dr Darnell Level at [facility]. ED precautions given and pt voiced understanding. No covid symptoms or known exposure. Will send note to Dr Darnell Level and Lattie Haw CMA and will teams LIsa.

## 2021-08-27 NOTE — Telephone Encounter (Signed)
Will see tomorrow

## 2021-08-28 ENCOUNTER — Telehealth: Payer: Self-pay | Admitting: Family Medicine

## 2021-08-28 ENCOUNTER — Encounter: Payer: Self-pay | Admitting: Family Medicine

## 2021-08-28 ENCOUNTER — Ambulatory Visit (INDEPENDENT_AMBULATORY_CARE_PROVIDER_SITE_OTHER): Payer: PPO | Admitting: Family Medicine

## 2021-08-28 ENCOUNTER — Ambulatory Visit
Admission: RE | Admit: 2021-08-28 | Discharge: 2021-08-28 | Disposition: A | Discharge status: Home / Self Care | Payer: PPO | Source: Ambulatory Visit | Attending: Family Medicine | Admitting: Family Medicine

## 2021-08-28 ENCOUNTER — Other Ambulatory Visit: Payer: Self-pay

## 2021-08-28 VITALS — BP 136/84 | HR 72 | Temp 97.5°F | Ht 72.0 in | Wt 181.3 lb

## 2021-08-28 DIAGNOSIS — R0989 Other specified symptoms and signs involving the circulatory and respiratory systems: Secondary | ICD-10-CM

## 2021-08-28 DIAGNOSIS — M7989 Other specified soft tissue disorders: Secondary | ICD-10-CM

## 2021-08-28 DIAGNOSIS — Z86718 Personal history of other venous thrombosis and embolism: Secondary | ICD-10-CM

## 2021-08-28 DIAGNOSIS — R600 Localized edema: Secondary | ICD-10-CM | POA: Diagnosis not present

## 2021-08-28 DIAGNOSIS — M79662 Pain in left lower leg: Secondary | ICD-10-CM | POA: Insufficient documentation

## 2021-08-28 DIAGNOSIS — M79605 Pain in left leg: Secondary | ICD-10-CM | POA: Diagnosis not present

## 2021-08-28 NOTE — Telephone Encounter (Addendum)
This call report came to my basket while I was at lunch and Dr. Darnell Level was not made aware until now.  Fyi to Dr. Caleb Popp and Modena Nunnery

## 2021-08-28 NOTE — Assessment & Plan Note (Signed)
Recurrent LLE pain, swelling most noticeable at end of day or with prolonged sitting.  Will update venous US which will determine need for repeat anticoagulation.  In diminished pulses noted to LLE, discussed ABIs however it seems insurance may not cover as no claudication symptoms - will start with venous evaluation.  Pt agrees with plan.

## 2021-08-28 NOTE — Progress Notes (Signed)
Patient ID: William Shelton, male    DOB: [birth year], 70 y.o.   MRN: [id number]  This visit was conducted in person.  BP 136/84    Pulse 72    Temp (!) 97.5 F (36.4 C) (Temporal)    Ht 6' (1.829 m)    Wt 181 lb 5 oz (82.2 kg)    SpO2 99%    BMI 24.59 kg/m    CC: L leg pain/swelling Subjective:   HPI: William Shelton is a 70 y.o. male presenting on 08/28/2021 for Leg Pain (C/o left leg pain/swelling.  Started about 1 mo ago.  H/o blood clot.)   1 mo h/o left leg pain from popliteal region down posterior leg that is progressively worsening. Worse towards end of the day especially if he's done long walk that day, worse with prolonged sitting past 30 min, associated with leg swelling that improves with elevation. No pain during walking (no claudication). No redness of warmth of leg or pain to palpation. No hip pain or back pain. No chest pain, dyspnea or dizziness.   Had to break 619 day walking streak (3 miles/day) due to pain.  Prior DVT presented similarly (09/2019).   LLE venous US 09/2019 - age-indeterminate occlusive DVT involving the left popliteal vein extending to involve both paired left posterior tibial veins. Started on eliquis 5mg  bid 09/24/2019. Saw hematology in f/u - completed almost 5 mo eliquis treatment. Hypercoagulable workup negative. last seen 01/2020.      Relevant past medical, surgical, family and social history reviewed and updated as indicated. Interim medical history since our last visit reviewed. Allergies and medications reviewed and updated. Outpatient Medications Prior to Visit  Medication Sig Dispense Refill   amLODipine (NORVASC) 10 MG tablet Take 1 tablet (10 mg total) by mouth daily. 90 tablet 3   Cholecalciferol (VITAMIN D) 2000 units CAPS Take 1 capsule (2,000 Units total) by mouth daily. 30 capsule    EPINEPHrine 0.3 mg/0.3 mL IJ SOAJ injection Inject 0.3 mLs (0.3 mg total) into the muscle once. 1 Device 1   Multiple Vitamin (MULTIVITAMIN) capsule  Take 1 capsule by mouth daily.     Omega-3 Fatty Acids (FISH OIL PO) Take 1 capsule by mouth daily.     No facility-administered medications prior to visit.     Per HPI unless specifically indicated in ROS section below Review of Systems  Objective:  BP 136/84    Pulse 72    Temp (!) 97.5 F (36.4 C) (Temporal)    Ht 6' (1.829 m)    Wt 181 lb 5 oz (82.2 kg)    SpO2 99%    BMI 24.59 kg/m   Wt Readings from Last 3 Encounters:  08/28/21 181 lb 5 oz (82.2 kg)  07/13/21 180 lb (81.6 kg)  05/04/21 177 lb (80.3 kg)      Physical Exam Vitals and nursing note reviewed.  Constitutional:      Appearance: Normal appearance. He is not ill-appearing.  Musculoskeletal:        General: Normal range of motion.     Right lower leg: No edema.     Left lower leg: No edema.     Comments:  2+ DP/PT on right, diminished on left L calf circ 38cm R calf circ 37cm No pain with palpation of left posterior leg, no popliteal fullness, no palpable cords  No pain with testing L foot dorsiflexion/plantar flexion, inversion, or eversion at ankle  Skin:    General:  Skin is warm and dry.     Findings: No erythema.  Neurological:     Mental Status: He is alert.  Psychiatric:        Mood and Affect: Mood normal.        Behavior: Behavior normal.      Lab Results  Component Value Date   CREATININE 1.30 04/27/2021   BUN 18 04/27/2021   NA 141 04/27/2021   K 4.8 04/27/2021   CL 105 04/27/2021   CO2 30 04/27/2021     Assessment & Plan:  This visit occurred during the SARS-CoV-2 public health emergency.  Safety protocols were in place, including screening questions prior to the visit, additional usage of staff PPE, and extensive cleaning of exam room while observing appropriate contact time as indicated for disinfecting solutions.   Problem List Items Addressed This Visit     History of DVT of lower extremity   Pain and swelling of left lower leg - Primary    Recurrent LLE pain, swelling most  noticeable at end of day or with prolonged sitting.  Will update venous US which will determine need for repeat anticoagulation.  In diminished pulses noted to LLE, discussed ABIs however it seems insurance may not cover as no claudication symptoms - will start with venous evaluation.  Pt agrees with plan.       Relevant Orders   US Venous Img Lower Unilateral Left   Other Visit Diagnoses     Diminished pulses in lower extremity            No orders of the defined types were placed in this encounter.  Orders Placed This Encounter  Procedures   US Venous Img Lower Unilateral Left    Standing Status:   Future    Standing Expiration Date:   08/28/2022    Order Specific Question:   Reason for Exam (SYMPTOM  OR DIAGNOSIS REQUIRED)    Answer:   LLE pain, swelling, h/o DVT 2021    Order Specific Question:   Preferred imaging location?    Answer:   [facility]    Order Specific Question:   Call Results- Best Contact Number?    Answer:   [phone] before 5.  [phone] after 5.  hold patient     Patient Instructions  We will set you up for left leg venous ultrasound to evaluate for recurrent blood clot.  Good to see you today.   Follow up plan: Return if symptoms worsen or fail to improve.  Ria Bush, MD

## 2021-08-28 NOTE — Telephone Encounter (Signed)
Abby with [facility] called stating that pt Ultra Sound results was: Neg DVT, Complete Resolution of previous visualize left Popliteal Vein,Posterior Tibial Vein Thrombus.

## 2021-08-28 NOTE — Telephone Encounter (Signed)
Replied via result note section.

## 2021-08-28 NOTE — Patient Instructions (Addendum)
We will set you up for left leg venous ultrasound to evaluate for recurrent blood clot.  Good to see you today.

## 2021-08-28 NOTE — Assessment & Plan Note (Deleted)
Recurrent LLE pain, swelling most noticeable at end of day or with prolonged sitting.  Will update venous US which will determine need for repeat anticoagulation.  In diminished pulses noted to LLE, will also set up with ABI although today's symptoms are not consistent with claudication.  Pt agrees with plan.

## 2021-09-10 ENCOUNTER — Encounter: Payer: Self-pay | Admitting: Family Medicine

## 2021-09-12 ENCOUNTER — Other Ambulatory Visit: Payer: Self-pay | Admitting: *Deleted

## 2021-09-12 DIAGNOSIS — R972 Elevated prostate specific antigen [PSA]: Secondary | ICD-10-CM

## 2021-09-16 HISTORY — PX: COLONOSCOPY: SHX174

## 2021-09-18 DIAGNOSIS — Z1212 Encounter for screening for malignant neoplasm of rectum: Secondary | ICD-10-CM | POA: Diagnosis not present

## 2021-09-18 LAB — FECAL OCCULT BLOOD, GUAIAC: Fecal Occult Blood: POSITIVE

## 2021-09-19 ENCOUNTER — Encounter: Payer: Self-pay | Admitting: Gastroenterology

## 2021-09-24 ENCOUNTER — Ambulatory Visit (AMBULATORY_SURGERY_CENTER): Payer: PPO | Admitting: Gastroenterology

## 2021-09-24 ENCOUNTER — Other Ambulatory Visit: Payer: Self-pay

## 2021-09-24 ENCOUNTER — Encounter: Payer: Self-pay | Admitting: Gastroenterology

## 2021-09-24 VITALS — BP 154/90 | HR 64 | Temp 97.7°F | Resp 18 | Ht 72.0 in | Wt 180.0 lb

## 2021-09-24 DIAGNOSIS — D122 Benign neoplasm of ascending colon: Secondary | ICD-10-CM

## 2021-09-24 DIAGNOSIS — D175 Benign lipomatous neoplasm of intra-abdominal organs: Secondary | ICD-10-CM | POA: Diagnosis not present

## 2021-09-24 DIAGNOSIS — Z1211 Encounter for screening for malignant neoplasm of colon: Secondary | ICD-10-CM | POA: Diagnosis not present

## 2021-09-24 MED ORDER — SODIUM CHLORIDE 0.9 % IV SOLN: 500.0000 mL | Freq: Once | INTRAVENOUS | Status: DC

## 2021-09-24 NOTE — Progress Notes (Signed)
Vitals-CW  Pt's states no medical or surgical changes since previsit or office visit. 

## 2021-09-24 NOTE — Op Note (Signed)
[facility] Patient Name: William Shelton Procedure Date: 09/24/2021 8:11 AM MRN: [id number] Endoscopist: Ladene Artist , MD Age: 71 Referring MD:  Date of Birth: Jun 02, 1951 Gender: Male Account #: [id number] Procedure:                Colonoscopy Indications:              Screening for colorectal malignant neoplasm Medicines:                Monitored Anesthesia Care Procedure:                Pre-Anesthesia Assessment:                           - Prior to the procedure, a History and Physical                            was performed, and patient medications and                            allergies were reviewed. The patient's tolerance of                            previous anesthesia was also reviewed. The risks                            and benefits of the procedure and the sedation                            options and risks were discussed with the patient.                            All questions were answered, and informed consent                            was obtained. Prior Anticoagulants: The patient has                            taken no previous anticoagulant or antiplatelet                            agents. ASA Grade Assessment: II - A patient with                            mild systemic disease. After reviewing the risks                            and benefits, the patient was deemed in                            satisfactory condition to undergo the procedure.                           After obtaining informed consent, the colonoscope  was passed under direct vision. Throughout the                            procedure, the patient's blood pressure, pulse, and                            oxygen saturations were monitored continuously. The                            CF [id number] #[id number] was introduced through the anus                            and advanced to the the cecum, identified by                            appendiceal orifice  and ileocecal valve. The                            ileocecal valve, appendiceal orifice, and rectum                            were photographed. The quality of the bowel                            preparation was excellent. The colonoscopy was                            performed without difficulty. The patient tolerated                            the procedure well. Scope In: 8:20:39 AM Scope Out: 8:35:02 AM Scope Withdrawal Time: 0 hours 11 minutes 53 seconds  Total Procedure Duration: 0 hours 14 minutes 23 seconds  Findings:                 The perianal and digital rectal examinations were                            normal.                           A 6 mm polyp was found in the ascending colon. The                            polyp was sessile. The polyp was removed with a                            cold snare. Resection and retrieval were complete.                           There was a medium-sized lipoma, 15 mm in diameter,                            in the transverse colon. Biopsies were taken with a  cold forceps for histology. Verification of patient                            identification for the specimen was done using the                            patient's name and birth date.                           A few medium-mouthed diverticula were found in the                            left colon. There was no evidence of diverticular                            bleeding.                           Internal hemorrhoids were found during                            retroflexion. The hemorrhoids were small and Grade                            I (internal hemorrhoids that do not prolapse).                           The exam was otherwise without abnormality on                            direct and retroflexion views. Complications:            No immediate complications. Estimated blood loss:                            None. Estimated Blood Loss:      Estimated blood loss: none. Impression:               - One 6 mm polyp in the ascending colon, removed                            with a cold snare. Resected and retrieved.                           - Medium-sized lipoma in the transverse colon.                            Biopsied.                           - Mild diverticulosis in the left colon.                           - Internal hemorrhoids.                           -  The examination was otherwise normal on direct                            and retroflexion views. Recommendation:           - Repeat colonoscopy date to be determined after                            pending pathology results are reviewed for                            surveillance based on pathology results.                           - Patient has a contact number available for                            emergencies. The signs and symptoms of potential                            delayed complications were discussed with the                            patient. Return to normal activities tomorrow.                            Written discharge instructions were provided to the                            patient.                           - High fiber diet.                           - Continue present medications.                           - Await pathology results. Ladene Artist, MD 09/24/2021 8:40:35 AM This report has been signed electronically.

## 2021-09-24 NOTE — Progress Notes (Signed)
Called to room to assist during endoscopic procedure.  Patient ID and intended procedure confirmed with present staff. Received instructions for my participation in the procedure from the performing physician.  

## 2021-09-24 NOTE — Progress Notes (Signed)
To pacu, VSS. Report to [email or web address] 

## 2021-09-24 NOTE — Progress Notes (Signed)
See 08/28/22 H&P, no changes.

## 2021-09-24 NOTE — Patient Instructions (Signed)
Please read handouts provided. Continue present medications. Await pathology results. High Fiber Diet.    YOU HAD AN ENDOSCOPIC PROCEDURE TODAY AT [facility]:   Refer to the procedure report that was given to you for any specific questions about what was found during the examination.  If the procedure report does not answer your questions, please call your gastroenterologist to clarify.  If you requested that your care partner not be given the details of your procedure findings, then the procedure report has been included in a sealed envelope for you to review at your convenience later.  YOU SHOULD EXPECT: Some feelings of bloating in the abdomen. Passage of more gas than usual.  Walking can help get rid of the air that was put into your GI tract during the procedure and reduce the bloating. If you had a lower endoscopy (such as a colonoscopy or flexible sigmoidoscopy) you may notice spotting of blood in your stool or on the toilet paper. If you underwent a bowel prep for your procedure, you may not have a normal bowel movement for a few days.  Please Note:  You might notice some irritation and congestion in your nose or some drainage.  This is from the oxygen used during your procedure.  There is no need for concern and it should clear up in a day or so.  SYMPTOMS TO REPORT IMMEDIATELY:  Following lower endoscopy (colonoscopy or flexible sigmoidoscopy):  Excessive amounts of blood in the stool  Significant tenderness or worsening of abdominal pains  Swelling of the abdomen that is new, acute  Fever of 100F or higher   For urgent or emergent issues, a gastroenterologist can be reached at any hour by calling [phone]. Do not use MyChart messaging for urgent concerns.    DIET:  We do recommend a small meal at first, but then you may proceed to your regular diet.  Drink plenty of fluids but you should avoid alcoholic beverages for 24 hours.  ACTIVITY:  You should  plan to take it easy for the rest of today and you should NOT DRIVE or use heavy machinery until tomorrow (because of the sedation medicines used during the test).    FOLLOW UP: Our staff will call the number listed on your records 48-72 hours following your procedure to check on you and address any questions or concerns that you may have regarding the information given to you following your procedure. If we do not reach you, we will leave a message.  We will attempt to reach you two times.  During this call, we will ask if you have developed any symptoms of COVID 19. If you develop any symptoms (ie: fever, flu-like symptoms, shortness of breath, cough etc.) before then, please call [phone].  If you test positive for Covid 19 in the 2 weeks post procedure, please call and report this information to [place].    If any biopsies were taken you will be contacted by phone or by letter within the next 1-3 weeks.  Please call us at [phone] if you have not heard about the biopsies in 3 weeks.    SIGNATURES/CONFIDENTIALITY: You and/or your care partner have signed paperwork which will be entered into your electronic medical record.  These signatures attest to the fact that that the information above on your After Visit Summary has been reviewed and is understood.  Full responsibility of the confidentiality of this discharge information lies with you and/or your care-partner.

## 2021-09-26 ENCOUNTER — Telehealth: Payer: Self-pay

## 2021-09-26 NOTE — Telephone Encounter (Signed)
Left message on follow up call. 

## 2021-09-26 NOTE — Telephone Encounter (Signed)
°  Follow up Call-  Call back number 09/24/2021  Post procedure Call Back phone  # [phone]  Permission to leave phone message Yes  Some recent data might be hidden     Patient questions:  Do you have a fever, pain , or abdominal swelling? No. Pain Score  0 *  Have you tolerated food without any problems? Yes.    Have you been able to return to your normal activities? Yes.    Do you have any questions about your discharge instructions: Diet   No. Medications  No. Follow up visit  No.  Do you have questions or concerns about your Care? No.  Actions: * If pain score is 4 or above: No action needed, pain <4.

## 2021-09-27 ENCOUNTER — Other Ambulatory Visit: Payer: PPO

## 2021-09-27 ENCOUNTER — Other Ambulatory Visit: Payer: Self-pay

## 2021-09-27 DIAGNOSIS — R972 Elevated prostate specific antigen [PSA]: Secondary | ICD-10-CM | POA: Diagnosis not present

## 2021-09-28 LAB — PSA: Prostate Specific Ag, Serum: 4.9 ng/mL — ABNORMAL HIGH (ref 0.0–4.0)

## 2021-09-30 ENCOUNTER — Encounter: Payer: Self-pay | Admitting: Family Medicine

## 2021-10-01 NOTE — Progress Notes (Signed)
10/02/21 9:56 AM   William Shelton March 02, 1951 [id number]  Referring provider:  Ria Bush, MD [address] [place],  [place] [address] Chief Complaint  Patient presents with   Elevated PSA     HPI: William Shelton is a 71 y.o.male with a personal history of elevated/fluctuating PSA and abnormal digital rectal exam, who presents today for 1 year follow-up with PSA and DRE.   Prostate MRI on 11/07/2016 revealed no high-grade carcinoma and prostatic capsule is intact.   He has a significant family history of prostate cancer in his father, grandfather, great-grandfather. His father was diagnosed in his 61s with prostate cancer and died at age 38 of other etiologies  His most recent PSA on 09/27/2021 was 4.9.   He is doing well today he reports that he recently had a colonoscopy which was reassuring.   He questions the utility of continued PSA screening today  PSA trend:  4.9 on 09/27/21 4.75 on 04/27/21 5.1 on 09/04/20 6.1 on 03/01/20 6.1 on 08/30/19 5.5 on 03/11/19 6.6 on 08/21/2018 4.7  On 08/20/2017 6.0 on 02/17/17   6.6 on 10/11/16  --> MRI 10/2016 negative, no high grade lesions 6.7 on 03/25/16 4.95 on 10/06/15 5.8 on 10/16 4.20 October 2014  --> negative prostate biopsy (unable to pull up records, TRUS volume unknown) 4.22 July 2014 2.04 2014 4.25 2013   PMH: Past Medical History:  Diagnosis Date   Allergic rhinitis, cause unspecified    Arthritis    BPH (benign prostatic hypertrophy)    BPV (benign positional vertigo) 09/28/2015   Chronic kidney disease (CKD), stage III (moderate) (HCC) 10/22/2011   Clotting disorder ([place]) 09/2019   left leg DVT   ED (erectile dysfunction)    Elevated prostate specific antigen (PSA) 2016   eval by Dr Erlene Quan - s/p benign biopsy    Essential hypertension 08/04/2014   Hematuria, microscopic    HLD (hyperlipidemia)    Hydrocele, left    IBS (irritable bowel syndrome)    Internal hemorrhoid     Surgical  History: Past Surgical History:  Procedure Laterality Date   COLONOSCOPY  07/12/2008   Internal hemorrhoids---Dr. Fuller Plan   COLONOSCOPY  09/2021   ?TA, lipoma, int hem, diverticulosis ([facility])   INGUINAL HERNIA REPAIR  07/21/2002   Left---Dr. Bary Castilla    Home Medications:  Allergies as of 10/02/2021   No Known Allergies      Medication List        Accurate as of October 02, 2021  9:56 AM. If you have any questions, ask your nurse or doctor.          amLODipine 10 MG tablet Commonly known as: NORVASC Take 1 tablet (10 mg total) by mouth daily.   EPINEPHrine 0.3 mg/0.3 mL Soaj injection Commonly known as: EPI-PEN Inject 0.3 mLs (0.3 mg total) into the muscle once.   FISH OIL PO Take 1 capsule by mouth daily.   multivitamin capsule Take 1 capsule by mouth daily.   Vitamin D 50 MCG (2000 UT) Caps Take 1 capsule (2,000 Units total) by mouth daily.        Allergies: No Known Allergies  Family History: Family History  Problem Relation Age of Onset   Hypertension Father    Prostate cancer Father 69       Radiation seeds   Macular degeneration Father    Hyperlipidemia Brother    Other Brother        BPH   Diabetes Neg  Hx    Breast cancer Neg Hx    Depression Neg Hx    Alcohol abuse Neg Hx    Stroke Neg Hx    CAD Neg Hx    Rectal cancer Neg Hx    Stomach cancer Neg Hx    Colon polyps Neg Hx     Social History:  reports that he has never smoked. He has never used smokeless tobacco. He reports that he does not drink alcohol and does not use drugs.   Physical Exam: BP (!) 160/84    Pulse 68    Ht 6' (1.829 m)    Wt 180 lb (81.6 kg)    BMI 24.41 kg/m   Constitutional:  Alert and oriented, No acute distress. HEENT: [place] AT, moist mucus membranes.  Trachea midline, no masses. Cardiovascular: No clubbing, cyanosis, or edema. Respiratory: Normal respiratory effort, no increased work of breathing. Rectal: Normal sphincter tone,  50  CC prostate,  some mild  induration of the right lateral ridge (review of notes indicate that this is chronic and unchanged) Skin: No rashes, bruises or suspicious lesions. Neurologic: Grossly intact, no focal deficits, moving all 4 extremities. Psychiatric: Normal mood and affect.  Laboratory Data: Lab Results  Component Value Date   CREATININE 1.30 04/27/2021   Lab Results  Component Value Date   PSA 4.75 (H) 04/27/2021   PSA 4.4 04/04/2018   PSA 4.95 (H) 10/06/2015   Assessment & Plan:    Elevated PSA  - Long history of elevated/fluctuating PSA -We discussed the AUA recommendations about prostate cancer screening which depends a lot on the patient's overall life expectancy a.m. health, biological age. -That he is fairly healthy and his PSA remains elevated albeit stable with abnormal rectal exam, I do think that continued annual screening makes sense -Overall, his PSA has essentially stabilized.  He is interested in proceeding with PSA for another year.  I may consider having this followed by his PCP thereafter if his PSA and exam continues to remain stable  2. Abnormal rectal exam Slightly abnormal exam with firm lateral ridge, noted on previous exams with downward trending PSA which is reassuring as above.   Return in 1 year with PSA/DRE.   Altamese Call[place]burg as a scribe for [name], MD.,have documented all relevant documentation on the behalf of [name], MD,as directed by  [name], MD while in the presence of [name], MD.  I have reviewed the above documentation for accuracy and completeness, and I agree with the above.   [name], MD     [facility] [address], [place], [place] [address] [phone]

## 2021-10-02 ENCOUNTER — Ambulatory Visit: Payer: PPO | Admitting: Urology

## 2021-10-02 ENCOUNTER — Other Ambulatory Visit: Payer: Self-pay

## 2021-10-02 ENCOUNTER — Encounter: Payer: Self-pay | Admitting: Urology

## 2021-10-02 ENCOUNTER — Encounter: Payer: Self-pay | Admitting: Gastroenterology

## 2021-10-02 VITALS — BP 160/84 | HR 68 | Ht 72.0 in | Wt 180.0 lb

## 2021-10-02 DIAGNOSIS — R972 Elevated prostate specific antigen [PSA]: Secondary | ICD-10-CM | POA: Diagnosis not present

## 2021-10-04 ENCOUNTER — Encounter: Payer: Self-pay | Admitting: Family Medicine

## 2021-10-11 ENCOUNTER — Telehealth: Payer: Self-pay | Admitting: Family Medicine

## 2021-10-11 NOTE — Telephone Encounter (Signed)
William Shelton with [employer] [phone]  HTA processed a FOBT test for this patient through [place] on 1.5.23, wanted to know if we had gotten the results and followed up with the patient.  Asked William Shelton re-fax the results   William Shelton would like a call back when the results are received.

## 2021-10-12 NOTE — Telephone Encounter (Signed)
He's had colonoscopy since this abnormal test result.

## 2021-10-12 NOTE — Telephone Encounter (Signed)
Received results.  Printed and placed results in Dr. Synthia Innocent box.    Lvm asking Saralyn Pilar to call back.  Need to let him know we received results.

## 2021-10-15 ENCOUNTER — Encounter: Payer: Self-pay | Admitting: Family Medicine

## 2021-10-17 ENCOUNTER — Encounter: Payer: Self-pay | Admitting: Family Medicine

## 2021-10-17 ENCOUNTER — Ambulatory Visit (INDEPENDENT_AMBULATORY_CARE_PROVIDER_SITE_OTHER): Payer: PPO | Admitting: Family Medicine

## 2021-10-17 ENCOUNTER — Ambulatory Visit (INDEPENDENT_AMBULATORY_CARE_PROVIDER_SITE_OTHER): Payer: PPO

## 2021-10-17 ENCOUNTER — Other Ambulatory Visit: Payer: Self-pay

## 2021-10-17 VITALS — BP 112/78 | HR 63 | Temp 98.0°F | Ht 72.0 in | Wt 185.0 lb

## 2021-10-17 DIAGNOSIS — M25562 Pain in left knee: Secondary | ICD-10-CM

## 2021-10-17 DIAGNOSIS — G5702 Lesion of sciatic nerve, left lower limb: Secondary | ICD-10-CM | POA: Diagnosis not present

## 2021-10-17 DIAGNOSIS — M1712 Unilateral primary osteoarthritis, left knee: Secondary | ICD-10-CM | POA: Diagnosis not present

## 2021-10-17 DIAGNOSIS — G8929 Other chronic pain: Secondary | ICD-10-CM

## 2021-10-17 DIAGNOSIS — S76312A Strain of muscle, fascia and tendon of the posterior muscle group at thigh level, left thigh, initial encounter: Secondary | ICD-10-CM

## 2021-10-17 NOTE — Progress Notes (Signed)
Dalila Arca T. Tenita Cue, MD, [place] at [facility] [place], [address]  Phone: [phone]   FAX: [phone]  William Shelton - 71 y.o. male   MRN [id number]   Date of Birth: [birth year]  Date: 10/17/2021   PCP: Ria Bush, MD   Referral: Ria Bush, MD  Chief Complaint  Patient presents with   Knee Pain    Left   Hamstring Pull    Left-Happened during Thanksgiving playing football    This visit occurred during the SARS-CoV-2 public health emergency.  Safety protocols were in place, including screening questions prior to the visit, additional usage of staff PPE, and extensive cleaning of exam room while observing appropriate contact time as indicated for disinfecting solutions.   Subjective:   William Shelton is a 71 y.o. very pleasant male patient with Body mass index is 25.09 kg/m. who presents with the following:  He presents with 2 different problems.  He does have some ongoing left-sided knee pain, and he also has lingering hamstring injury from a football game that he was playing and Thanksgiving.  I reviewed his ultrasound of his left lower extremity done on August 28, 2021, and there was no evidence of DVT.  A few weeks before Thanksgiving, his knee on the left side did start to hurt.  He did feel a pull in the back of his left hamstring when he was playing football, and he is still been having some pain and dull ache at the proximal hamstring.  He also is now having some pain in the buttocks region centrally.  He does not describe any radicular pain.  He has done some stretching, but his symptoms have not resolved.  He has no significant history of prior knee injury, trauma, operative interventions.  No prior history of significant hamstring strain or tear. He also fell to pop in his knee last week. There is no mechanical symptoms, no locking up at the joint.  Knee does hurt some going up  and down stairs.   Review of Systems is noted in the HPI, as appropriate   Objective:   BP 112/78    Pulse 63    Temp 98 F (36.7 C) (Temporal)    Ht 6' (1.829 m)    Wt 185 lb (83.9 kg)    SpO2 100%    BMI 25.09 kg/m    HIP EXAM: SIDE: Left ROM: Abduction, Flexion, Internal and External range of motion: Full Pain with terminal IROM and EROM: No GTB: NT SLR: NEG Knees: No effusion FABER: NT REVERSE FABER: Some pain Piriformis: Notably tender to palpation Str: flexion: 5/5 abduction: 5/5 adduction: 5/5 Strength testing non-tender    Knee: Left Gait: Normal heel toe pattern ROM: 0-1 35 Effusion: neg Echymosis or edema: none Patellar tendon NT Painful PLICA: neg Patellar grind: negative Medial and lateral patellar facet loading: negative medial and lateral joint lines:NT Mcmurray's some pain with loading the knee Flexion-pinch positive Varus and valgus stress: stable Lachman: neg Ant and Post drawer: neg Quad: 5/5 VMO atrophy:No  Hamstring concentric and eccentric: At 90 degrees, the patient does have some mild pain with concentric strength training, and this is worsened with eccentric testing at 90 and worst at 30 with the foot particularly internally rotated at the knee.  Radiology: No results found.  Assessment and Plan:     ICD-10-CM   1. Hamstring strain, left, initial encounter  [phone]  2. Chronic pain of left knee  M25.562 DG Knee 4 Views W/Patella Left   G89.29     3. Primary osteoarthritis of left knee  M17.12     4. Piriformis syndrome, left  G57.02      Total encounter time: 30 minutes. This includes total time spent on the day of encounter.  Additional time spent on independent plain film interpretation and teaching.  Multipart problem with likely initial mild arthritis exacerbation, worsened with acute hamstring injury.  Now with some secondary piriformis syndrome tightness.  Gave him a comprehensive hamstring and piriformis rehab  program, which is also quite supportive and helpful for her knee pain.  On his plain x-ray, patient does have some modest medial compartmental joint space narrowing and slight osteophyte formation.  He is going to follow-up with me in 6 weeks.  No orders of the defined types were placed in this encounter.  Medications Discontinued During This Encounter  Medication Reason   EPINEPHrine 0.3 mg/0.3 mL IJ SOAJ injection Patient Preference   Orders Placed This Encounter  Procedures   DG Knee 4 Views W/Patella Left    Follow-up: No follow-ups on file.  [employer] One speech-to-text software was used for transcription in this dictation.  Possible transcriptional errors can occur using Editor, commissioning.   Signed,  Maud Deed. Cruzito Standre, MD   Outpatient Encounter Medications as of 10/17/2021  Medication Sig   amLODipine (NORVASC) 10 MG tablet Take 1 tablet (10 mg total) by mouth daily.   Cholecalciferol (VITAMIN D) 2000 units CAPS Take 1 capsule (2,000 Units total) by mouth daily.   Multiple Vitamin (MULTIVITAMIN) capsule Take 1 capsule by mouth daily.   Omega-3 Fatty Acids (FISH OIL PO) Take 1 capsule by mouth daily.   [DISCONTINUED] EPINEPHrine 0.3 mg/0.3 mL IJ SOAJ injection Inject 0.3 mLs (0.3 mg total) into the muscle once.   No facility-administered encounter medications on file as of 10/17/2021.

## 2021-10-17 NOTE — Patient Instructions (Signed)
Hamstring Rehab 1)  HS curls - start with 3 sets of 15 and progress to 3 sets of 30 every 3 days  2)  HS curls with weight - begin with 2 lb ankle weight when above is easy and start with 3 sets of 10; increasing every 5 days by 5 reps - eg to 3 sets of 15 reps  3)  HS swings - swing leg backwards and curl at the end of the swing; follow same schedule as above.  4)  HS running lunges - running lunge position means no more than 45 degrees of knee flexion and running motion.  follow same schedule as above.

## 2021-11-09 ENCOUNTER — Encounter: Payer: Self-pay | Admitting: Family Medicine

## 2021-11-09 DIAGNOSIS — M25562 Pain in left knee: Secondary | ICD-10-CM

## 2021-11-09 DIAGNOSIS — S76312A Strain of muscle, fascia and tendon of the posterior muscle group at thigh level, left thigh, initial encounter: Secondary | ICD-10-CM

## 2021-11-09 DIAGNOSIS — G8929 Other chronic pain: Secondary | ICD-10-CM

## 2021-11-12 NOTE — Telephone Encounter (Signed)
Ref made

## 2021-11-19 ENCOUNTER — Encounter: Payer: Self-pay | Admitting: Family Medicine

## 2021-11-22 DIAGNOSIS — M25552 Pain in left hip: Secondary | ICD-10-CM | POA: Diagnosis not present

## 2021-11-22 DIAGNOSIS — M799 Soft tissue disorder, unspecified: Secondary | ICD-10-CM | POA: Diagnosis not present

## 2021-11-26 DIAGNOSIS — M25552 Pain in left hip: Secondary | ICD-10-CM | POA: Diagnosis not present

## 2021-11-26 DIAGNOSIS — M799 Soft tissue disorder, unspecified: Secondary | ICD-10-CM | POA: Diagnosis not present

## 2021-11-28 DIAGNOSIS — M799 Soft tissue disorder, unspecified: Secondary | ICD-10-CM | POA: Diagnosis not present

## 2021-11-28 DIAGNOSIS — M25552 Pain in left hip: Secondary | ICD-10-CM | POA: Diagnosis not present

## 2021-12-03 DIAGNOSIS — M25552 Pain in left hip: Secondary | ICD-10-CM | POA: Diagnosis not present

## 2021-12-03 DIAGNOSIS — M799 Soft tissue disorder, unspecified: Secondary | ICD-10-CM | POA: Diagnosis not present

## 2021-12-06 DIAGNOSIS — M799 Soft tissue disorder, unspecified: Secondary | ICD-10-CM | POA: Diagnosis not present

## 2021-12-06 DIAGNOSIS — M25552 Pain in left hip: Secondary | ICD-10-CM | POA: Diagnosis not present

## 2021-12-10 DIAGNOSIS — M799 Soft tissue disorder, unspecified: Secondary | ICD-10-CM | POA: Diagnosis not present

## 2021-12-10 DIAGNOSIS — M25552 Pain in left hip: Secondary | ICD-10-CM | POA: Diagnosis not present

## 2021-12-13 DIAGNOSIS — M25552 Pain in left hip: Secondary | ICD-10-CM | POA: Diagnosis not present

## 2021-12-13 DIAGNOSIS — M799 Soft tissue disorder, unspecified: Secondary | ICD-10-CM | POA: Diagnosis not present

## 2021-12-17 DIAGNOSIS — M799 Soft tissue disorder, unspecified: Secondary | ICD-10-CM | POA: Diagnosis not present

## 2021-12-17 DIAGNOSIS — M25552 Pain in left hip: Secondary | ICD-10-CM | POA: Diagnosis not present

## 2021-12-24 DIAGNOSIS — M799 Soft tissue disorder, unspecified: Secondary | ICD-10-CM | POA: Diagnosis not present

## 2021-12-24 DIAGNOSIS — M25552 Pain in left hip: Secondary | ICD-10-CM | POA: Diagnosis not present

## 2021-12-27 DIAGNOSIS — M799 Soft tissue disorder, unspecified: Secondary | ICD-10-CM | POA: Diagnosis not present

## 2021-12-27 DIAGNOSIS — M25552 Pain in left hip: Secondary | ICD-10-CM | POA: Diagnosis not present

## 2021-12-31 DIAGNOSIS — M25552 Pain in left hip: Secondary | ICD-10-CM | POA: Diagnosis not present

## 2021-12-31 DIAGNOSIS — M799 Soft tissue disorder, unspecified: Secondary | ICD-10-CM | POA: Diagnosis not present

## 2022-01-03 DIAGNOSIS — M25552 Pain in left hip: Secondary | ICD-10-CM | POA: Diagnosis not present

## 2022-01-03 DIAGNOSIS — M799 Soft tissue disorder, unspecified: Secondary | ICD-10-CM | POA: Diagnosis not present

## 2022-05-14 ENCOUNTER — Ambulatory Visit (INDEPENDENT_AMBULATORY_CARE_PROVIDER_SITE_OTHER): Payer: PPO

## 2022-05-14 VITALS — Wt 185.0 lb

## 2022-05-14 DIAGNOSIS — Z0000 Encounter for general adult medical examination without abnormal findings: Secondary | ICD-10-CM | POA: Diagnosis not present

## 2022-05-14 NOTE — Patient Instructions (Signed)
[name] , Thank you for taking time to come for your Medicare Wellness Visit. I appreciate your ongoing commitment to your health goals. Please review the following plan we discussed and let me know if I can assist you in the future.   Screening recommendations/referrals: Colonoscopy: 09/24/21 Recommended yearly ophthalmology/optometry visit for glaucoma screening and checkup Recommended yearly dental visit for hygiene and checkup  Vaccinations: Influenza vaccine: 07/06/21 Pneumococcal vaccine: 10/31/16 Tdap vaccine: 05/30/08, due if have injury Shingles vaccine: Zostavax 08/04/14   Covid-19: 10/23/19, 11/17/19, 06/26/20, 04/15/21, 07/23/21  Advanced directives: no  Conditions/risks identified: none  Next appointment: Follow up in one year for your annual wellness visit. 05/16/23 @ 10 am by phone  Preventive Care 72 Years and Older, Male Preventive care refers to lifestyle choices and visits with your health care provider that can promote health and wellness. What does preventive care include? A yearly physical exam. This is also called an annual well check. Dental exams once or twice a year. Routine eye exams. Ask your health care provider how often you should have your eyes checked. Personal lifestyle choices, including: Daily care of your teeth and gums. Regular physical activity. Eating a healthy diet. Avoiding tobacco and drug use. Limiting alcohol use. Practicing safe sex. Taking low doses of aspirin every day. Taking vitamin and mineral supplements as recommended by your health care provider. What happens during an annual well check? The services and screenings done by your health care provider during your annual well check will depend on your age, overall health, lifestyle risk factors, and family history of disease. Counseling  Your health care provider may ask you questions about your: Alcohol use. Tobacco use. Drug use. Emotional well-being. Home and relationship  well-being. Sexual activity. Eating habits. History of falls. Memory and ability to understand (cognition). Work and work Statistician. Screening  You may have the following tests or measurements: Height, weight, and BMI. Blood pressure. Lipid and cholesterol levels. These may be checked every 5 years, or more frequently if you are over 15 years old. Skin check. Lung cancer screening. You may have this screening every year starting at age 26 if you have a 30-pack-year history of smoking and currently smoke or have quit within the past 15 years. Fecal occult blood test (FOBT) of the stool. You may have this test every year starting at age 73. Flexible sigmoidoscopy or colonoscopy. You may have a sigmoidoscopy every 5 years or a colonoscopy every 10 years starting at age 72. Prostate cancer screening. Recommendations will vary depending on your family history and other risks. Hepatitis C blood test. Hepatitis B blood test. Sexually transmitted disease (STD) testing. Diabetes screening. This is done by checking your blood sugar (glucose) after you have not eaten for a while (fasting). You may have this done every 1-3 years. Abdominal aortic aneurysm (AAA) screening. You may need this if you are a current or former smoker. Osteoporosis. You may be screened starting at age 30 if you are at high risk. Talk with your health care provider about your test results, treatment options, and if necessary, the need for more tests. Vaccines  Your health care provider may recommend certain vaccines, such as: Influenza vaccine. This is recommended every year. Tetanus, diphtheria, and acellular pertussis (Tdap, Td) vaccine. You may need a Td booster every 10 years. Zoster vaccine. You may need this after age 26. Pneumococcal 13-valent conjugate (PCV13) vaccine. One dose is recommended after age 68. Pneumococcal polysaccharide (PPSV23) vaccine. One dose is recommended after  age 45. Talk to your health care  provider about which screenings and vaccines you need and how often you need them. This information is not intended to replace advice given to you by your health care provider. Make sure you discuss any questions you have with your health care provider. Document Released: 09/29/2015 Document Revised: 05/22/2016 Document Reviewed: 07/04/2015 Elsevier Interactive Patient Education  2017 [place] Prevention in the Home Falls can cause injuries. They can happen to people of all ages. There are many things you can do to make your home safe and to help prevent falls. What can I do on the outside of my home? Regularly fix the edges of walkways and driveways and fix any cracks. Remove anything that might make you trip as you walk through a door, such as a raised step or threshold. Trim any bushes or trees on the path to your home. Use bright outdoor lighting. Clear any walking paths of anything that might make someone trip, such as rocks or tools. Regularly check to see if handrails are loose or broken. Make sure that both sides of any steps have handrails. Any raised decks and porches should have guardrails on the edges. Have any leaves, snow, or ice cleared regularly. Use sand or salt on walking paths during winter. Clean up any spills in your garage right away. This includes oil or grease spills. What can I do in the bathroom? Use night lights. Install grab bars by the toilet and in the tub and shower. Do not use towel bars as grab bars. Use non-skid mats or decals in the tub or shower. If you need to sit down in the shower, use a plastic, non-slip stool. Keep the floor dry. Clean up any water that spills on the floor as soon as it happens. Remove soap buildup in the tub or shower regularly. Attach bath mats securely with double-sided non-slip rug tape. Do not have throw rugs and other things on the floor that can make you trip. What can I do in the bedroom? Use night lights. Make  sure that you have a light by your bed that is easy to reach. Do not use any sheets or blankets that are too big for your bed. They should not hang down onto the floor. Have a firm chair that has side arms. You can use this for support while you get dressed. Do not have throw rugs and other things on the floor that can make you trip. What can I do in the kitchen? Clean up any spills right away. Avoid walking on wet floors. Keep items that you use a lot in easy-to-reach places. If you need to reach something above you, use a strong step stool that has a grab bar. Keep electrical cords out of the way. Do not use floor polish or wax that makes floors slippery. If you must use wax, use non-skid floor wax. Do not have throw rugs and other things on the floor that can make you trip. What can I do with my stairs? Do not leave any items on the stairs. Make sure that there are handrails on both sides of the stairs and use them. Fix handrails that are broken or loose. Make sure that handrails are as long as the stairways. Check any carpeting to make sure that it is firmly attached to the stairs. Fix any carpet that is loose or worn. Avoid having throw rugs at the top or bottom of the stairs. If you do  have throw rugs, attach them to the floor with carpet tape. Make sure that you have a light switch at the top of the stairs and the bottom of the stairs. If you do not have them, ask someone to add them for you. What else can I do to help prevent falls? Wear shoes that: Do not have high heels. Have rubber bottoms. Are comfortable and fit you well. Are closed at the toe. Do not wear sandals. If you use a stepladder: Make sure that it is fully opened. Do not climb a closed stepladder. Make sure that both sides of the stepladder are locked into place. Ask someone to hold it for you, if possible. Clearly mark and make sure that you can see: Any grab bars or handrails. First and last steps. Where the  edge of each step is. Use tools that help you move around (mobility aids) if they are needed. These include: Canes. Walkers. Scooters. Crutches. Turn on the lights when you go into a dark area. Replace any light bulbs as soon as they burn out. Set up your furniture so you have a clear path. Avoid moving your furniture around. If any of your floors are uneven, fix them. If there are any pets around you, be aware of where they are. Review your medicines with your doctor. Some medicines can make you feel dizzy. This can increase your chance of falling. Ask your doctor what other things that you can do to help prevent falls. This information is not intended to replace advice given to you by your health care provider. Make sure you discuss any questions you have with your health care provider. Document Released: 06/29/2009 Document Revised: 02/08/2016 Document Reviewed: 10/07/2014 Elsevier Interactive Patient Education  2017 [place]. 09/24/21

## 2022-05-14 NOTE — Progress Notes (Signed)
Virtual Visit via Telephone Note  I connected with  William Shelton on 05/14/22 at  9:30 AM EDT by telephone and verified that I am speaking with the correct person using two identifiers.  Location: Patient: home Provider: [place] Persons participating in the virtual visit: [place]   I discussed the limitations, risks, security and privacy concerns of performing an evaluation and management service by telephone and the availability of in person appointments. The patient expressed understanding and agreed to proceed.  Interactive audio and video telecommunications were attempted between this nurse and patient, however failed, due to patient having technical difficulties OR patient did not have access to video capability.  We continued and completed visit with audio only.  Some vital signs may be absent or patient reported.   Dionisio David, LPN  Subjective:   William Shelton is a 71 y.o. male who presents for Medicare Annual/Subsequent preventive examination.  Review of Systems     Cardiac Risk Factors include: advanced age (>33mn, >>32women);hypertension;dyslipidemia;male gender     Objective:    There were no vitals filed for this visit. There is no height or weight on file to calculate BMI.     05/14/2022    9:30 AM 03/14/2020    8:20 AM  Advanced Directives  Does Patient Have a Medical Advance Directive? No Yes  Type of ACorporate treasurerof [place] will  Copy of [place]?  No - copy requested  Would patient like information on creating a medical advance directive? No - Patient declined     Current Medications (verified) Outpatient Encounter Medications as of 05/14/2022  Medication Sig   amLODipine (NORVASC) 10 MG tablet Take 1 tablet (10 mg total) by mouth daily.   Cholecalciferol (VITAMIN D) 2000 units CAPS Take 1 capsule (2,000 Units total) by mouth daily.   Multiple Vitamin  (MULTIVITAMIN) capsule Take 1 capsule by mouth daily.   Omega-3 Fatty Acids (FISH OIL PO) Take 1 capsule by mouth daily.   No facility-administered encounter medications on file as of 05/14/2022.    Allergies (verified) Patient has no known allergies.   History: Past Medical History:  Diagnosis Date   Allergic rhinitis, cause unspecified    Arthritis    BPH (benign prostatic hypertrophy)    BPV (benign positional vertigo) 09/28/2015   Chronic kidney disease (CKD), stage III (moderate) (HCC) 10/22/2011   [place] 09[place]   left leg DVT   ED (erectile dysfunction)    Elevated prostate specific antigen (PSA) 2016   eval by Dr BErlene Quan- s/p benign biopsy    Essential hypertension 08/04/2014   Hematuria, microscopic    HLD (hyperlipidemia)    Hydrocele, left    IBS (irritable bowel syndrome)    Internal hemorrhoid    Past Surgical History:  Procedure Laterality Date   COLONOSCOPY  07/12/2008   Internal hemorrhoids---Dr. SFuller Plan  COLONOSCOPY  09/2021   ?TA, lipoma, int hem, diverticulosis, rpt 7 yrs ([facility])   INGUINAL HERNIA REPAIR  07/21/2002   Left---Dr. BBary Castilla  Family History  Problem Relation Age of Onset   Hypertension Father    Prostate cancer Father 752      Radiation seeds   Macular degeneration Father    Hyperlipidemia Brother    Other Brother        BPH   Diabetes Neg Hx    Breast cancer Neg Hx    Depression Neg  Hx    Alcohol abuse Neg Hx    Stroke Neg Hx    CAD Neg Hx    Rectal cancer Neg Hx    Stomach cancer Neg Hx    Colon polyps Neg Hx    Social History   Socioeconomic History   Marital status: Married    Spouse name: Not on file   Number of children: 1   Years of education: Not on file   Highest education level: Not on file  Occupational History   Occupation: Retired  Tobacco Use   Smoking status: Never   Smokeless tobacco: Never  Vaping Use   Vaping Use: Never used  Substance and Sexual Activity   Alcohol use: No   Drug  use: No   Sexual activity: Not on file  Other Topics Concern   Not on file  Social History Narrative   Lives in [place]. Married; lives with wife; 1 son Retired Surveyor, mining ; Activity: walks daily Diet: good water, fruits/vegetables daily.      No smoking; ocassional beer.    Social Determinants of Health   Financial Resource Strain: Low Risk  (05/14/2022)   Overall Financial Resource Strain (CARDIA)    Difficulty of Paying Living Expenses: Not hard at all  Food Insecurity: No Food Insecurity (05/14/2022)   Hunger Vital Sign    Worried About Running Out of Food in the Last Year: Never true    Ran Out of Food in the Last Year: Never true  Transportation Needs: No Transportation Needs (05/14/2022)   PRAPARE - Hydrologist (Medical): No    Lack of Transportation (Non-Medical): No  Physical Activity: Sufficiently Active (05/14/2022)   Exercise Vital Sign    Days of Exercise per Week: 7 days    Minutes of Exercise per Session: 30 min  Stress: No Stress Concern Present (05/14/2022)   [place]    Feeling of Stress : Not at all  Social Connections: Socially Isolated (05/14/2022)   Social Connection and Isolation Panel [NHANES]    Frequency of Communication with Friends and Family: Never    Frequency of Social Gatherings with Friends and Family: Never    Attends Religious Services: Never    Marine scientist or Organizations: No    Attends Music therapist: Never    Marital Status: Married    Tobacco Counseling Counseling given: Not Answered   Clinical Intake:  Pre-visit preparation completed: Yes  Pain : No/denies pain     Nutritional Risks: None Diabetes: No  How often do you need to have someone help you when you read instructions, pamphlets, or other written materials from your doctor or pharmacy?: 1 -  Never  Diabetic?no  Interpreter Needed?: No  Information entered by :: Kirke Shaggy, LPN   Activities of Daily Living    05/14/2022    9:31 AM  In your present state of health, do you have any difficulty performing the following activities:  Hearing? 1  Vision? 0  Difficulty concentrating or making decisions? 0  Walking or climbing stairs? 0  Dressing or bathing? 0  Doing errands, shopping? 0  Preparing Food and eating ? N  Using the Toilet? N  In the past six months, have you accidently leaked urine? N  Do you have problems with loss of bowel control? N  Managing your Medications? N  Managing your Finances? N  Housekeeping or managing your Housekeeping?  N    Patient Care Team: Ria Bush, MD as PCP - General (Family Medicine)  Indicate any recent Medical Services you may have received from other than [facility] providers in the past year (date may be approximate).     Assessment:   This is a routine wellness examination for William Shelton.  Hearing/Vision screen Hearing Screening - Comments:: Wears aids Vision Screening - Comments:: Wears glasses- [facility]  Dietary issues and exercise activities discussed: Current Exercise Habits: Home exercise routine, Type of exercise: walking;strength training/weights, Time (Minutes): 30, Frequency (Times/Week): 7, Weekly Exercise (Minutes/Week): 210, Intensity: Mild   Goals Addressed             This Visit's Progress    DIET - EAT MORE FRUITS AND VEGETABLES         Depression Screen    05/14/2022    9:29 AM 05/04/2021   10:38 AM 03/14/2020    8:22 AM 10/01/2019    8:15 AM 11/10/2018    2:09 PM 11/05/2017   10:38 AM 10/31/2016   10:33 AM  PHQ 2/9 Scores  PHQ - 2 Score 0 0 0 0 0 0 0  PHQ- 9 Score 0  0        Fall Risk    05/14/2022    9:31 AM 05/04/2021   10:38 AM 03/14/2020    8:22 AM 10/01/2019    8:15 AM 11/10/2018    2:08 PM  Fall Risk   Falls in the past year? 0 0 0 0 0  Number falls in past yr: 0  0     Injury with Fall? 0  0    Risk for fall due to : No Fall Risks  Medication side effect    Follow up Falls evaluation completed  Falls evaluation completed;Falls prevention discussed      FALL RISK PREVENTION PERTAINING TO THE HOME:  Any stairs in or around the home? No  If so, are there any without handrails? No  Home free of loose throw rugs in walkways, pet beds, electrical cords, etc? Yes  Adequate lighting in your home to reduce risk of falls? Yes   ASSISTIVE DEVICES UTILIZED TO PREVENT FALLS:  Life alert? No  Use of a cane, walker or w/c? Yes  Grab bars in the bathroom? No  Shower chair or bench in shower? No  Elevated toilet seat or a handicapped toilet? No   Cognitive Function:    03/14/2020    8:24 AM  MMSE - Mini Mental State Exam  Orientation to time 5  Orientation to Place 5  Registration 3  Attention/ Calculation 5  Recall 3  Language- repeat 1        05/14/2022    9:35 AM  6CIT Screen  What Year? 0 points  What month? 0 points  What time? 0 points  Count back from 20 0 points  Months in reverse 0 points  Repeat phrase 2 points  Total Score 2 points    Immunizations Immunization History  Administered Date(s) Administered   Fluad Quad(high Dose 65+) 07/01/2019, 07/04/2020   Influenza Split 06/05/2011, 07/22/2012   Influenza Whole 06/07/2009, 08/04/2010   Influenza, High Dose Seasonal PF 07/31/2017, 07/10/2018, 07/06/2021   Influenza,inj,Quad PF,6+ Mos 08/04/2014, 08/03/2015, 06/21/2016   PFIZER(Purple Top)SARS-COV-2 Vaccination 10/23/2019, 11/17/2019, 06/26/2020, 04/15/2021   Pfizer Covid-19 Vaccine Bivalent Booster 25yr & up 07/23/2021   Pneumococcal Conjugate-13 10/12/2015   Pneumococcal Polysaccharide-23 10/31/2016   Td 05/30/2008   Zoster, Live 08/04/2014  TDAP status: Due, Education has been provided regarding the importance of this vaccine. Advised may receive this vaccine at local pharmacy or Health Dept. Aware to provide a copy of  the vaccination record if obtained from local pharmacy or Health Dept. Verbalized acceptance and understanding.  Flu Vaccine status: Up to date  Pneumococcal vaccine status: Up to date  Covid-19 vaccine status: Completed vaccines  Qualifies for Shingles Vaccine? Yes   Zostavax completed Yes   Shingrix Completed?: No.    Education has been provided regarding the importance of this vaccine. Patient has been advised to call insurance company to determine out of pocket expense if they have not yet received this vaccine. Advised may also receive vaccine at local pharmacy or Health Dept. Verbalized acceptance and understanding.  Screening Tests Health Maintenance  Topic Date Due   Zoster Vaccines- Shingrix (1 of 2) Never done   COVID-19 Vaccine (6 - Pfizer series) 11/20/2021   INFLUENZA VACCINE  04/16/2022   TETANUS/TDAP  03/14/2024 (Originally 05/30/2018)   COLON CANCER SCREENING ANNUAL FOBT  09/24/2022   COLONOSCOPY (Pts 45-37yr Insurance coverage will need to be confirmed)  09/24/2028   Pneumonia Vaccine 71 Years old  Completed   Hepatitis C Screening  Completed   HPV VACCINES  Aged Out    Health Maintenance  Health Maintenance Due  Topic Date Due   Zoster Vaccines- Shingrix (1 of 2) Never done   COVID-19 Vaccine (6 - Pfizer series) 11/20/2021   INFLUENZA VACCINE  04/16/2022    Colorectal cancer screening: Type of screening: Colonoscopy. Completed 09/24/21. Repeat every 7 years  Lung Cancer Screening: (Low Dose CT Chest recommended if Age 72-80years, 30 pack-year currently smoking OR have quit w/in 15years.) does not qualify.    Additional Screening:  Hepatitis C Screening: does qualify; Completed 10/06/15  Vision Screening: Recommended annual ophthalmology exams for early detection of glaucoma and other disorders of the eye. Is the patient up to date with their annual eye exam?  Yes  Who is the provider or what is the name of the office in which the patient attends annual  eye exams? [facility] [facility] is not established with a provider, would they like to be referred to a provider to establish care? No .   Dental Screening: Recommended annual dental exams for proper oral hygiene  Community Resource Referral / Chronic Care Management: CRR required this visit?  No   CCM required this visit?  No      Plan:     I have personally reviewed and noted the following in the patient's chart:   Medical and social history Use of alcohol, tobacco or illicit drugs  Current medications and supplements including opioid prescriptions. Patient is not currently taking opioid prescriptions. Functional ability and status Nutritional status Physical activity Advanced directives List of other physicians Hospitalizations, surgeries, and ER visits in previous 12 months Vitals Screenings to include cognitive, depression, and falls Referrals and appointments  In addition, I have reviewed and discussed with patient certain preventive protocols, quality metrics, and best practice recommendations. A written personalized care plan for preventive services as well as general preventive health recommendations were provided to patient.     LDionisio David LPN   [id number]  Nurse Notes: none

## 2022-05-24 DIAGNOSIS — H5203 Hypermetropia, bilateral: Secondary | ICD-10-CM | POA: Diagnosis not present

## 2022-05-24 DIAGNOSIS — H2513 Age-related nuclear cataract, bilateral: Secondary | ICD-10-CM | POA: Diagnosis not present

## 2022-05-24 DIAGNOSIS — H18523 Epithelial (juvenile) corneal dystrophy, bilateral: Secondary | ICD-10-CM | POA: Diagnosis not present

## 2022-05-24 DIAGNOSIS — D3132 Benign neoplasm of left choroid: Secondary | ICD-10-CM | POA: Diagnosis not present

## 2022-05-24 DIAGNOSIS — D3131 Benign neoplasm of right choroid: Secondary | ICD-10-CM | POA: Diagnosis not present

## 2022-05-24 DIAGNOSIS — H524 Presbyopia: Secondary | ICD-10-CM | POA: Diagnosis not present

## 2022-06-05 ENCOUNTER — Other Ambulatory Visit: Payer: PPO

## 2022-06-12 ENCOUNTER — Encounter: Payer: PPO | Admitting: Family Medicine

## 2022-06-12 IMAGING — DX DG KNEE COMPLETE 4+V*L*
4 series · 4 of 4 positions shown · non-contrast
Comparison: None.

CLINICAL DATA: Left knee pain. Clinical concern for osteoarthritis.

EXAM:
LEFT KNEE - COMPLETE 4+ VIEW

[knee ap]
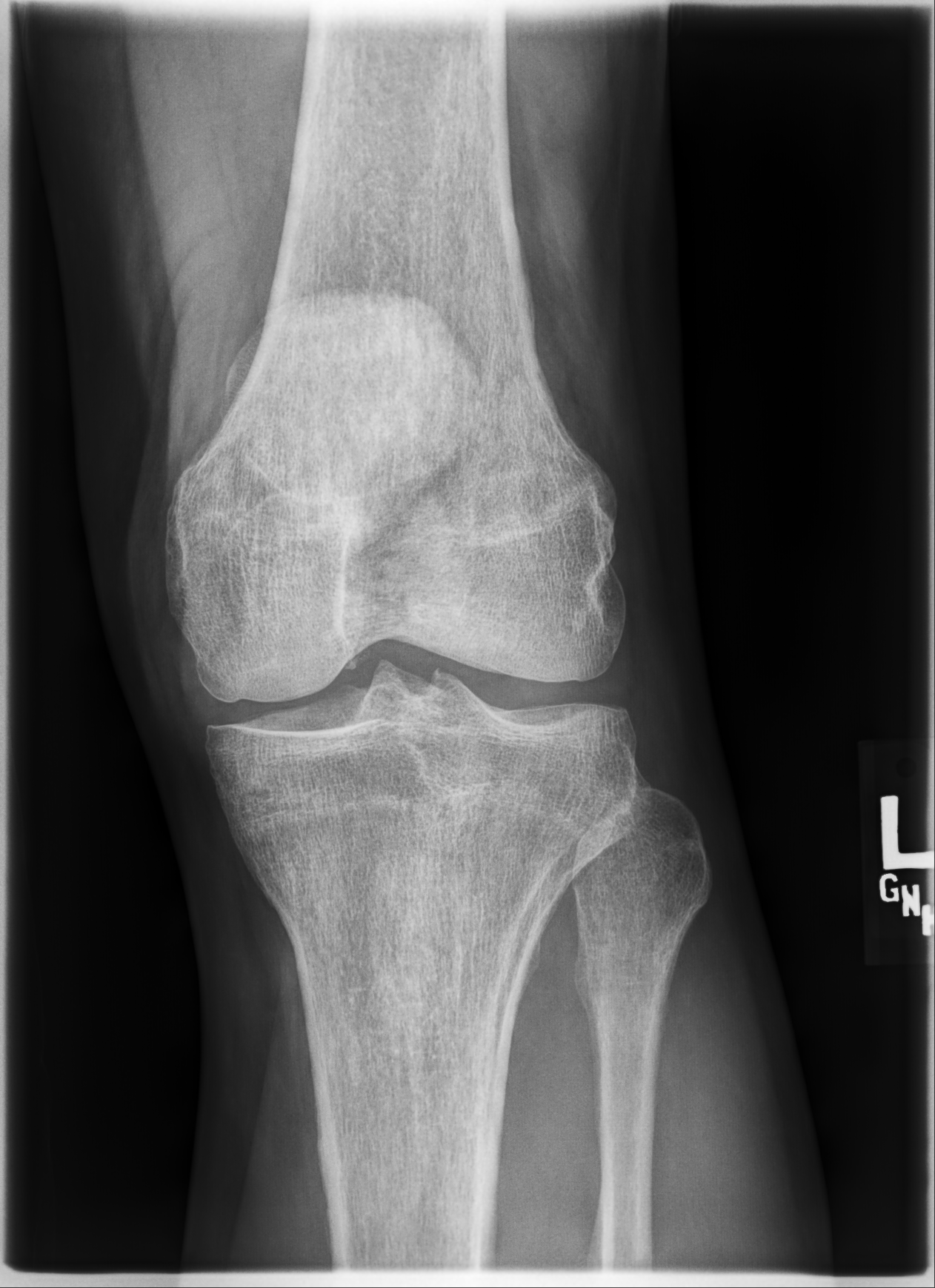

[knee [person_name] view pa]
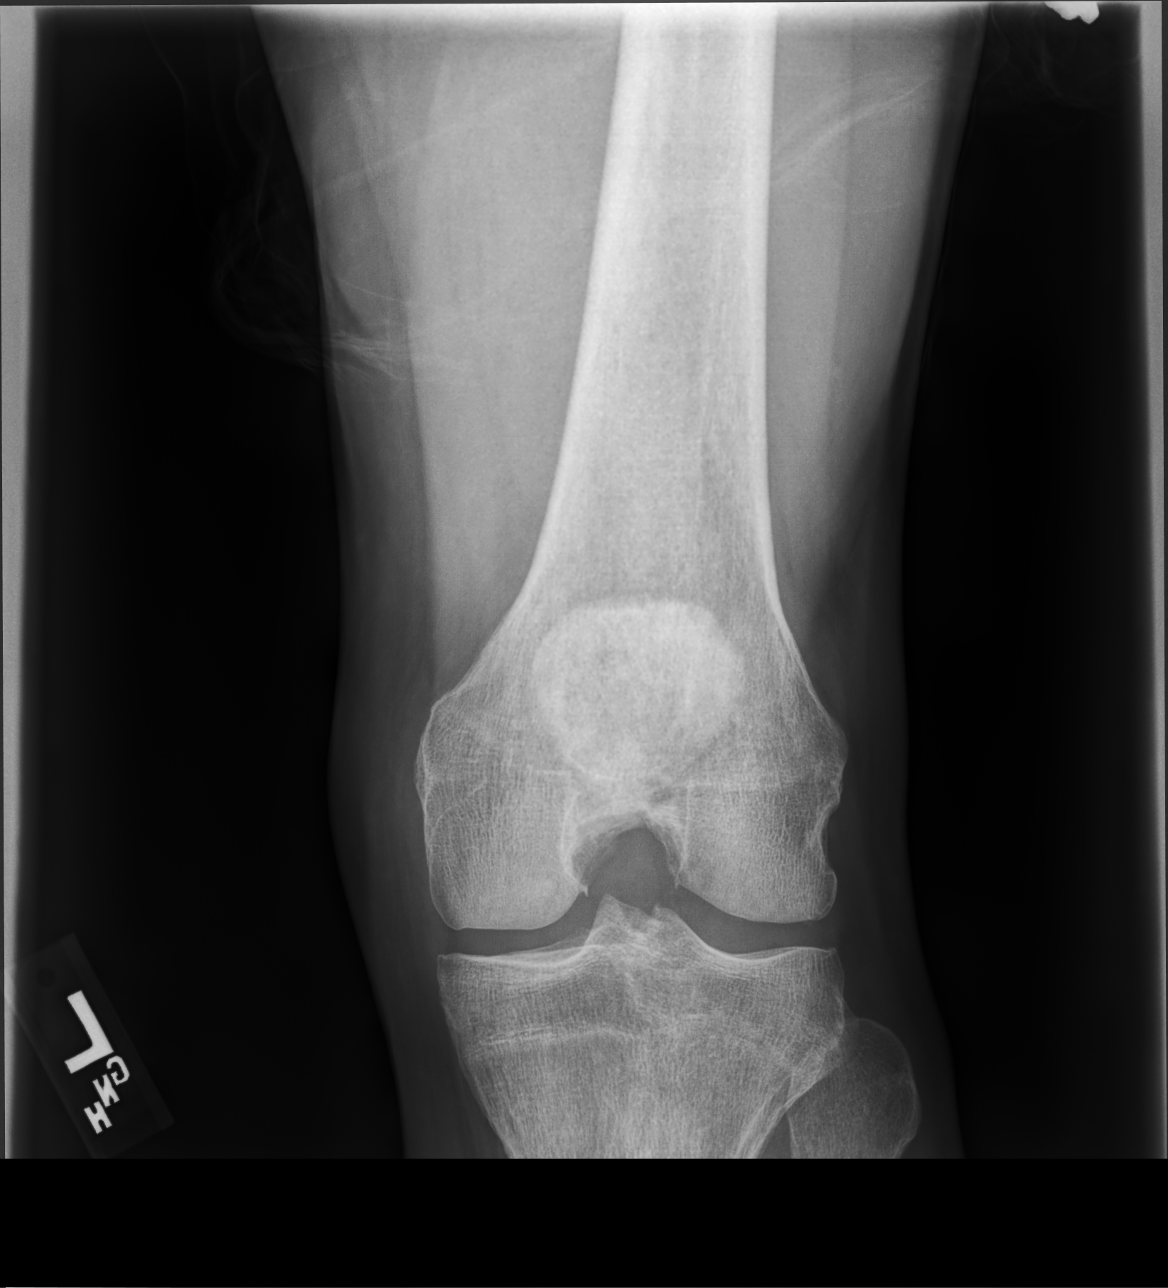

[knee lat]
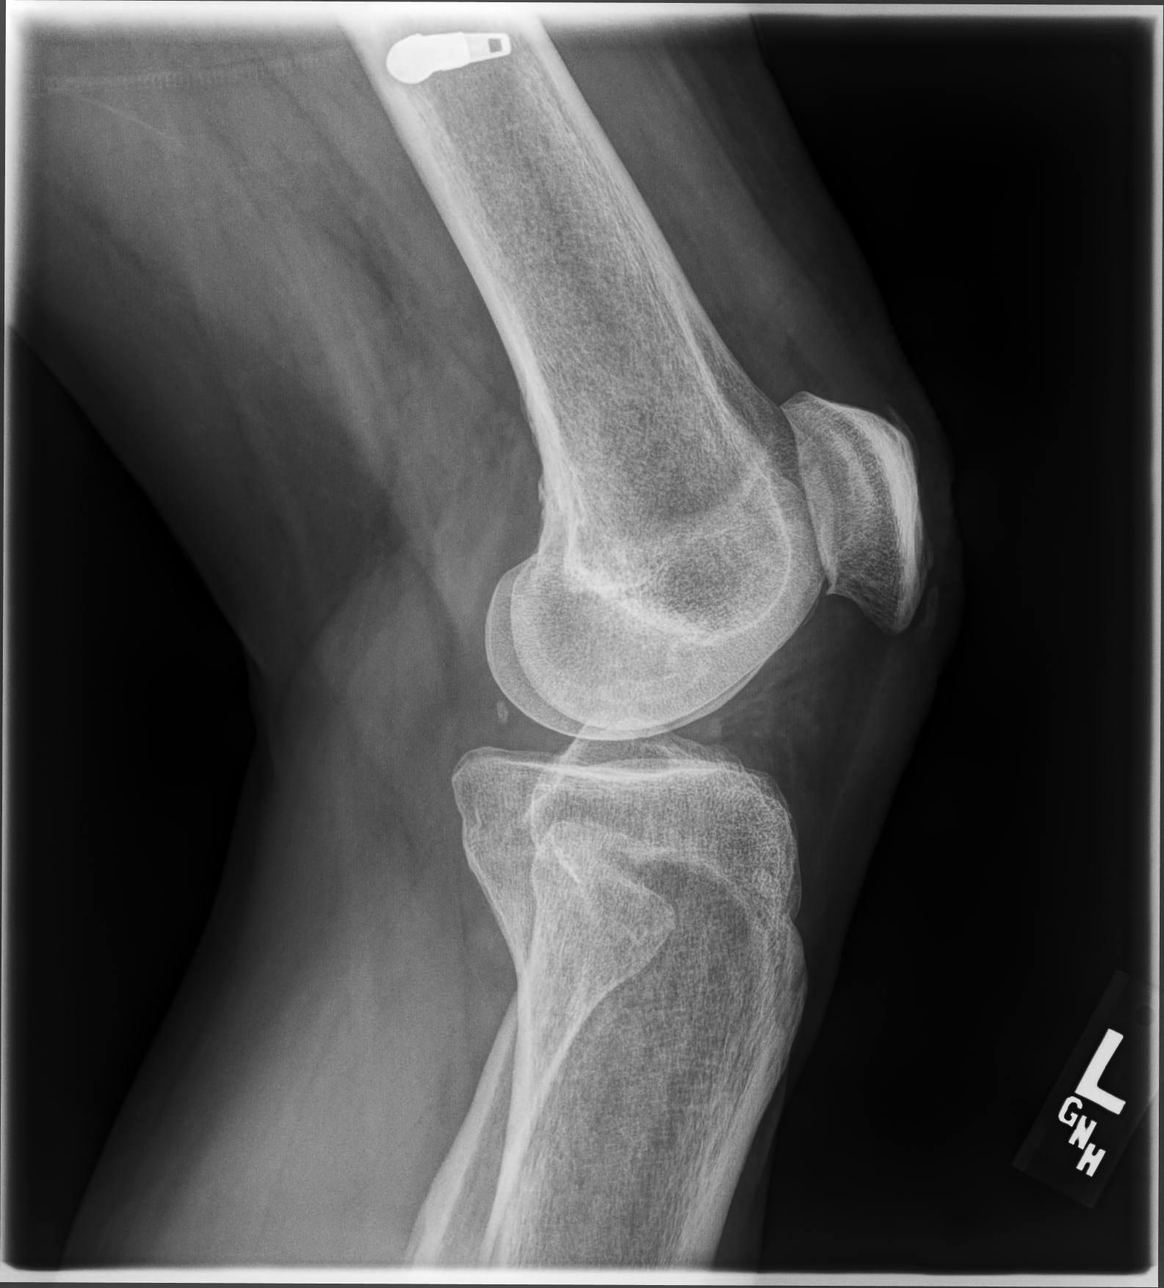

[patella (sunrise)]
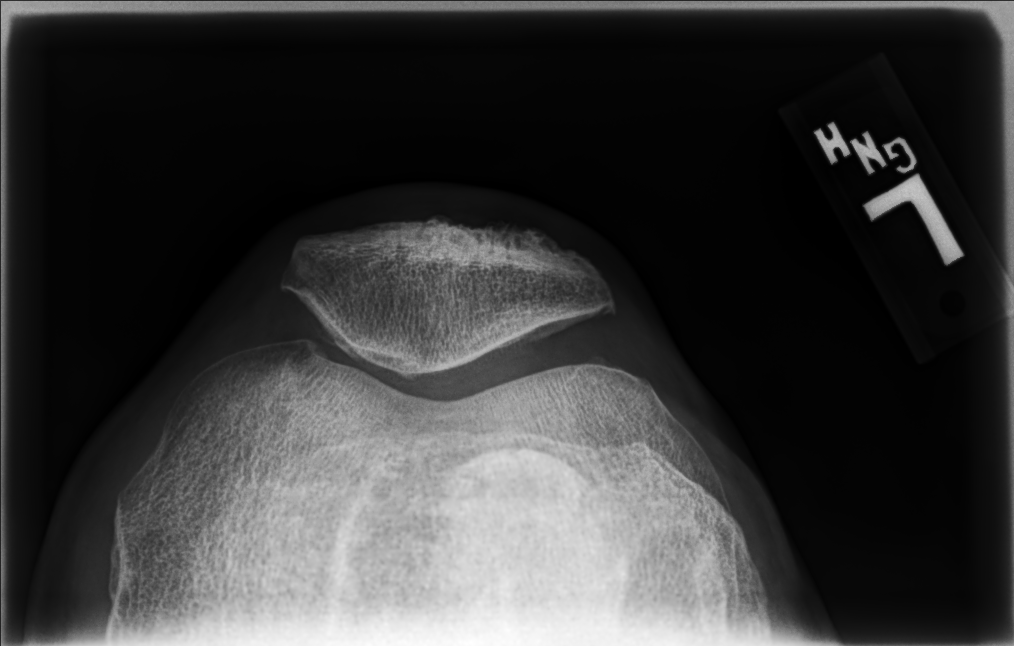

[4 of 4 positions shown; findings below may reference images not displayed]

FINDINGS: Minimal medial compartment joint space narrowing and peripheral
osteophytosis. Minimal peripheral medial and lateral femoral condyle
osteophytosis at the intercondylar notch on PA view. Moderate medial
patellofemoral joint space narrowing. Tiny joint effusion. 4 mm
ossicle posterior to the femoral condyles on lateral view, possible
tiny loose body. No acute fracture or dislocation.
IMPRESSION: Minimal medial compartment and moderate patellofemoral compartment
osteoarthritis.

## 2022-06-26 ENCOUNTER — Encounter: Payer: Self-pay | Admitting: Family Medicine

## 2022-06-27 NOTE — Telephone Encounter (Signed)
Updated pt's chart.  

## 2022-07-03 DIAGNOSIS — H903 Sensorineural hearing loss, bilateral: Secondary | ICD-10-CM | POA: Diagnosis not present

## 2022-07-15 ENCOUNTER — Other Ambulatory Visit: Payer: Self-pay | Admitting: Family Medicine

## 2022-07-15 DIAGNOSIS — I10 Essential (primary) hypertension: Secondary | ICD-10-CM

## 2022-07-21 ENCOUNTER — Other Ambulatory Visit: Payer: Self-pay | Admitting: Family Medicine

## 2022-07-21 DIAGNOSIS — E559 Vitamin D deficiency, unspecified: Secondary | ICD-10-CM

## 2022-07-21 DIAGNOSIS — N1831 Chronic kidney disease, stage 3a: Secondary | ICD-10-CM

## 2022-07-21 DIAGNOSIS — E7800 Pure hypercholesterolemia, unspecified: Secondary | ICD-10-CM

## 2022-07-25 ENCOUNTER — Other Ambulatory Visit (INDEPENDENT_AMBULATORY_CARE_PROVIDER_SITE_OTHER): Payer: PPO

## 2022-07-25 DIAGNOSIS — E7800 Pure hypercholesterolemia, unspecified: Secondary | ICD-10-CM

## 2022-07-25 DIAGNOSIS — E559 Vitamin D deficiency, unspecified: Secondary | ICD-10-CM | POA: Diagnosis not present

## 2022-07-25 DIAGNOSIS — N1831 Chronic kidney disease, stage 3a: Secondary | ICD-10-CM | POA: Diagnosis not present

## 2022-07-25 LAB — CBC WITH DIFFERENTIAL/PLATELET
Basophils Absolute: 0 10*3/uL (ref 0.0–0.1)
Basophils Relative: 0.7 % (ref 0.0–3.0)
Eosinophils Absolute: 0.6 10*3/uL (ref 0.0–0.7)
Eosinophils Relative: 10.1 % — ABNORMAL HIGH (ref 0.0–5.0)
HCT: 45 % (ref 39.0–52.0)
Hemoglobin: 14.7 g/dL (ref 13.0–17.0)
Lymphocytes Relative: 41.5 % (ref 12.0–46.0)
Lymphs Abs: 2.3 10*3/uL (ref 0.7–4.0)
MCHC: 32.7 g/dL (ref 30.0–36.0)
MCV: 92.1 fl (ref 78.0–100.0)
Monocytes Absolute: 0.7 10*3/uL (ref 0.1–1.0)
Monocytes Relative: 12.5 % — ABNORMAL HIGH (ref 3.0–12.0)
Neutro Abs: 1.9 10*3/uL (ref 1.4–7.7)
Neutrophils Relative %: 35.2 % — ABNORMAL LOW (ref 43.0–77.0)
Platelets: 217 10*3/uL (ref 150.0–400.0)
RBC: 4.88 Mil/uL (ref 4.22–5.81)
RDW: 13.8 % (ref 11.5–15.5)
WBC: 5.5 10*3/uL (ref 4.0–10.5)

## 2022-07-25 LAB — LIPID PANEL
Cholesterol: 224 mg/dL — ABNORMAL HIGH (ref 0–200)
HDL: 65.1 mg/dL (ref >39.00)
LDL Cholesterol: 141 mg/dL — ABNORMAL HIGH (ref 0–99)
NonHDL: 158.4
Total CHOL/HDL Ratio: 3
Triglycerides: 89 mg/dL (ref 0.0–149.0)
VLDL: 17.8 mg/dL (ref 0.0–40.0)

## 2022-07-25 LAB — COMPREHENSIVE METABOLIC PANEL
ALT: 22 U/L (ref 0–53)
AST: 26 U/L (ref 0–37)
Albumin: 4.3 g/dL (ref 3.5–5.2)
Alkaline Phosphatase: 78 U/L (ref 39–117)
BUN: 22 mg/dL (ref 6–23)
CO2: 28 mEq/L (ref 19–32)
Calcium: 9.3 mg/dL (ref 8.4–10.5)
Chloride: 104 mEq/L (ref 96–112)
Creatinine, Ser: 1.26 mg/dL (ref 0.40–1.50)
GFR: 57.05 mL/min — ABNORMAL LOW (ref >60.00)
Glucose, Bld: 87 mg/dL (ref 70–99)
Potassium: 4.5 mEq/L (ref 3.5–5.1)
Sodium: 140 mEq/L (ref 135–145)
Total Bilirubin: 0.6 mg/dL (ref 0.2–1.2)
Total Protein: 7 g/dL (ref 6.0–8.3)

## 2022-07-25 LAB — VITAMIN D 25 HYDROXY (VIT D DEFICIENCY, FRACTURES): VITD: 46.46 ng/mL (ref 30.00–100.00)

## 2022-07-26 LAB — MICROALBUMIN / CREATININE URINE RATIO
Creatinine,U: 118.5 mg/dL
Microalb Creat Ratio: 3.4 mg/g (ref 0.0–30.0)
Microalb, Ur: 4 mg/dL — ABNORMAL HIGH (ref 0.0–1.9)

## 2022-07-26 LAB — PARATHYROID HORMONE, INTACT (NO CA): PTH: 39 pg/mL (ref 16–77)

## 2022-07-30 ENCOUNTER — Encounter: Payer: Self-pay | Admitting: Family Medicine

## 2022-07-30 ENCOUNTER — Ambulatory Visit (INDEPENDENT_AMBULATORY_CARE_PROVIDER_SITE_OTHER): Payer: PPO | Admitting: Family Medicine

## 2022-07-30 VITALS — BP 132/80 | HR 65 | Temp 97.3°F | Ht 71.5 in | Wt 167.6 lb

## 2022-07-30 DIAGNOSIS — H903 Sensorineural hearing loss, bilateral: Secondary | ICD-10-CM

## 2022-07-30 DIAGNOSIS — Z7189 Other specified counseling: Secondary | ICD-10-CM | POA: Diagnosis not present

## 2022-07-30 DIAGNOSIS — D721 Eosinophilia, unspecified: Secondary | ICD-10-CM | POA: Diagnosis not present

## 2022-07-30 DIAGNOSIS — R0989 Other specified symptoms and signs involving the circulatory and respiratory systems: Secondary | ICD-10-CM

## 2022-07-30 DIAGNOSIS — N1831 Chronic kidney disease, stage 3a: Secondary | ICD-10-CM

## 2022-07-30 DIAGNOSIS — R972 Elevated prostate specific antigen [PSA]: Secondary | ICD-10-CM

## 2022-07-30 DIAGNOSIS — E7800 Pure hypercholesterolemia, unspecified: Secondary | ICD-10-CM

## 2022-07-30 DIAGNOSIS — Z0000 Encounter for general adult medical examination without abnormal findings: Secondary | ICD-10-CM

## 2022-07-30 DIAGNOSIS — I10 Essential (primary) hypertension: Secondary | ICD-10-CM

## 2022-07-30 DIAGNOSIS — E559 Vitamin D deficiency, unspecified: Secondary | ICD-10-CM

## 2022-07-30 NOTE — Patient Instructions (Addendum)
Bring [place] copy of your advanced directive  We will order neck artery ultrasound  in [place]  Consider cholesterol medicine, work on diet to lower cholesterol (increased fiber and legumes).  You are doing well today.  Return in 6 months for lab visit only to recheck cholesterol levels.  Return in 1 year for next physical.  Health Maintenance After Age 71 After age 26, you are at a higher risk for certain long-term diseases and infections as well as injuries from falls. Falls are a major cause of broken bones and head injuries in people who are older than age 15. Getting regular preventive care can help to keep you healthy and well. Preventive care includes getting regular testing and making lifestyle changes as recommended by your health care provider. Talk with your health care provider about: Which screenings and tests you should have. A screening is a test that checks for a disease when you have no symptoms. A diet and exercise plan that is right for you. What should I know about screenings and tests to prevent falls? Screening and testing are the best ways to find a health problem early. Early diagnosis and treatment give you the best chance of managing medical conditions that are common after age 44. Certain conditions and lifestyle choices may make you more likely to have a fall. Your health care provider may recommend: Regular vision checks. Poor vision and conditions such as cataracts can make you more likely to have a fall. If you wear glasses, make sure to get your prescription updated if your vision changes. Medicine review. Work with your health care provider to regularly review all of the medicines you are taking, including over-the-counter medicines. Ask your health care provider about any side effects that may make you more likely to have a fall. Tell your health care provider if any medicines that you take make you feel dizzy or sleepy. Strength and balance checks. Your health care  provider may recommend certain tests to check your strength and balance while standing, walking, or changing positions. Foot health exam. Foot pain and numbness, as well as not wearing proper footwear, can make you more likely to have a fall. Screenings, including: Osteoporosis screening. Osteoporosis is a condition that causes the bones to get weaker and break more easily. Blood pressure screening. Blood pressure changes and medicines to control blood pressure can make you feel dizzy. Depression screening. You may be more likely to have a fall if you have a fear of falling, feel depressed, or feel unable to do activities that you used to do. Alcohol use screening. Using too much alcohol can affect your balance and may make you more likely to have a fall. Follow these instructions at home: Lifestyle Do not drink alcohol if: Your health care provider tells you not to drink. If you drink alcohol: Limit how much you have to: 0-1 drink a day for women. 0-2 drinks a day for men. Know how much alcohol is in your drink. In the [place] one drink equals one 12 oz bottle of beer (355 mL), one 5 oz glass of wine (148 mL), or one 1 oz glass of hard liquor (44 mL). Do not use any products that contain nicotine or tobacco. These products include cigarettes, chewing tobacco, and vaping devices, such as e-cigarettes. If you need help quitting, ask your health care provider. Activity  Follow a regular exercise program to stay fit. This will help you maintain your balance. Ask your health care provider what types  of exercise are appropriate for you. If you need a cane or walker, use it as recommended by your health care provider. Wear supportive shoes that have nonskid soles. Safety  Remove any tripping hazards, such as rugs, cords, and clutter. Install safety equipment such as grab bars in bathrooms and safety rails on stairs. Keep rooms and walkways well-lit. General instructions Talk with your health  care provider about your risks for falling. Tell your health care provider if: You fall. Be sure to tell your health care provider about all falls, even ones that seem minor. You feel dizzy, tiredness (fatigue), or off-balance. Take over-the-counter and prescription medicines only as told by your health care provider. These include supplements. Eat a healthy diet and maintain a healthy weight. A healthy diet includes low-fat dairy products, low-fat (lean) meats, and fiber from whole grains, beans, and lots of fruits and vegetables. Stay current with your vaccines. Schedule regular health, dental, and eye exams. Summary Having a healthy lifestyle and getting preventive care can help to protect your health and wellness after age 32. Screening and testing are the best way to find a health problem early and help you avoid having a fall. Early diagnosis and treatment give you the best chance for managing medical conditions that are more common for people who are older than age 37. Falls are a major cause of broken bones and head injuries in people who are older than age 33. Take precautions to prevent a fall at home. Work with your health care provider to learn what changes you can make to improve your health and wellness and to prevent falls. This information is not intended to replace advice given to you by your health care provider. Make sure you discuss any questions you have with your health care provider. Document Revised: 01/22/2021 Document Reviewed: 01/22/2021 Elsevier Patient Education  [place].

## 2022-07-30 NOTE — Assessment & Plan Note (Signed)
Chronic, stable on amlodipine - continue.

## 2022-07-30 NOTE — Assessment & Plan Note (Signed)
Wears hearing aides, planned updating this year.

## 2022-07-30 NOTE — Assessment & Plan Note (Signed)
Preventative protocols reviewed and updated unless pt declined. Discussed healthy diet and lifestyle.  

## 2022-07-30 NOTE — Assessment & Plan Note (Signed)
Check carotid US. 

## 2022-07-30 NOTE — Assessment & Plan Note (Addendum)
Chronic, off medication. Reviewed ASCVD risk with patient. He will work on diet choices to improve levels. Reassess at 6 wk lab visit.  The 10-year ASCVD risk score (Arnett DK, et al., 2019) is: 23.6%   Values used to calculate the score:     Age: 71 years     Sex: Male     Is Non-Hispanic African American: No     Diabetic: No     Tobacco smoker: No     Systolic Blood Pressure: 033 mmHg     Is BP treated: Yes     HDL Cholesterol: 65.1 mg/dL     Total Cholesterol: 224 mg/dL

## 2022-07-30 NOTE — Assessment & Plan Note (Addendum)
Non-microalbuminuric.  Chronic, mild. Will continue to monitor.

## 2022-07-30 NOTE — Assessment & Plan Note (Signed)
Advanced directive: has at home. HCPOA would be wife. Asked to bring copy to update chart.

## 2022-07-30 NOTE — Assessment & Plan Note (Signed)
Followed regularly by urology.

## 2022-07-30 NOTE — Progress Notes (Addendum)
Patient ID: William Shelton, male    DOB: 25-Aug-1951, 71 y.o.   MRN: [id number]  This visit was conducted in person.  BP 132/80   Pulse 65   Temp (!) 97.3 F (36.3 C) (Temporal)   Ht 5' 11.5" (1.816 m)   Wt 167 lb 9.6 oz (76 kg)   SpO2 100%   BMI 23.05 kg/m    CC: CPE Subjective:   HPI: William Shelton is a 71 y.o. male presenting on 07/30/2022 for Annual Exam (MCR prt 2. )   Saw health advisor 04/2022 for medicare wellness visit. Note reviewed.    No results found.  Flowsheet Row Clinical Support from 05/14/2022 in [place]  PHQ-2 Total Score 0          05/14/2022    9:31 AM 05/04/2021   10:38 AM 03/14/2020    8:22 AM 10/01/2019    8:15 AM 11/10/2018    2:08 PM  Fall Risk   Falls in the past year? 0 0 0 0 0  Number falls in past yr: 0  0    Injury with Fall? 0  0    Risk for fall due to : No Fall Risks  Medication side effect    Follow up Falls evaluation completed  Falls evaluation completed;Falls prevention discussed     LLE venous US 09/2019 - age-indeterminate occlusive DVT involving the left popliteal vein extending to involve both paired left posterior tibial veins. Started on eliquis '5mg'$  bid 09/24/2019. Saw hematology in f/u - completed 3 mo eliquis treatment. Hypercoagulable workup negative. Rpt venous US 08/2021 - complete resolution of DVT.    Ongoing L leg pain - has led to decreased walking. He's started doing more resistance training.   Preventative: COLONOSCOPY Date: 07/12/08 Internal hemorrhoids Fuller Plan). Requests rpt colonoscopy.   COLONOSCOPY 09/2021 - ?TA, lipoma, int hem, diverticulosis, rpt 7 yrs Fuller Plan)  Prostate cancer screening - elevated PSA followed by Dr Erlene Quan last seen 09/2021. H/o low risk velocity, biopsy normal 12/2014. Prostate MRI reassuring 10/2016. + fmhx father with prostate cancer dx age 66s, controlled with seeds. Lung cancer screening - not eligible Flu shot yearly  COVID vaccine - Pfizer 10/2019, 11/2019,  booster 06/2020, 03/2021, bivalent 07/2021, 06/2022.  Prevnar-13 09/2015, pneumovax 2018  Td 2009  zostavax - 07/2014  shingrix - discussed, declines Advanced directive: has at home. HCPOA would be wife. Asked to bring copy to update chart. Seat belt use discussed Sunscreen use discussed. No changing moles on skin Nonsmoker  Alcohol - none Dentist q6 mo  Eye exam yearly (05/2022) Bowel - no constipation  Bladder - no incontinence    Married; lives with wife  1 son  Retired [employer]  Enjoys beekeeping  Activity: walks daily 3 miles.  Diet: good water, fruits/vegetables daily      Relevant past medical, surgical, family and social history reviewed and updated as indicated. Interim medical history since our last visit reviewed. Allergies and medications reviewed and updated. Outpatient Medications Prior to Visit  Medication Sig Dispense Refill   amLODipine (NORVASC) 10 MG tablet TAKE 1 TABLET BY MOUTH EVERY DAY 90 tablet 0   Cholecalciferol (VITAMIN D) 2000 units CAPS Take 1 capsule (2,000 Units total) by mouth daily. 30 capsule    Multiple Vitamin (MULTIVITAMIN) capsule Take 1 capsule by mouth daily.     Omega-3 Fatty Acids (FISH OIL PO) Take 1 capsule by mouth daily.     No facility-administered  medications prior to visit.     Per HPI unless specifically indicated in ROS section below Review of Systems  Constitutional:  Negative for activity change, appetite change, chills, fatigue, fever and unexpected weight change.  HENT:  Negative for hearing loss.   Eyes:  Negative for visual disturbance.  Respiratory:  Negative for cough, chest tightness, shortness of breath and wheezing.   Cardiovascular:  Negative for chest pain, palpitations and leg swelling.  Gastrointestinal:  Negative for abdominal distention, abdominal pain, blood in stool, constipation, diarrhea, nausea and vomiting.  Genitourinary:  Negative for difficulty urinating and hematuria.   Musculoskeletal:  Negative for arthralgias, myalgias and neck pain.  Skin:  Negative for rash.  Neurological:  Negative for dizziness, seizures, syncope and headaches.  Hematological:  Negative for adenopathy. Does not bruise/bleed easily.  Psychiatric/Behavioral:  Negative for dysphoric mood. The patient is not nervous/anxious.     Objective:  BP 132/80   Pulse 65   Temp (!) 97.3 F (36.3 C) (Temporal)   Ht 5' 11.5" (1.816 m)   Wt 167 lb 9.6 oz (76 kg)   SpO2 100%   BMI 23.05 kg/m   Wt Readings from Last 3 Encounters:  07/30/22 167 lb 9.6 oz (76 kg)  05/14/22 185 lb (83.9 kg)  10/17/21 185 lb (83.9 kg)      Physical Exam Vitals and nursing note reviewed.  Constitutional:      General: He is not in acute distress.    Appearance: Normal appearance. He is well-developed. He is not ill-appearing.  HENT:     Head: Normocephalic and atraumatic.     Right Ear: Hearing, tympanic membrane, ear canal and external ear normal.     Left Ear: Hearing, tympanic membrane, ear canal and external ear normal.     Nose: Nose normal.     Mouth/Throat:     Mouth: Mucous membranes are moist.     Pharynx: Oropharynx is clear. No oropharyngeal exudate or posterior oropharyngeal erythema.  Eyes:     General: No scleral icterus.    Extraocular Movements: Extraocular movements intact.     Conjunctiva/sclera: Conjunctivae normal.     Pupils: Pupils are equal, round, and reactive to light.  Neck:     Thyroid: No thyroid mass or thyromegaly.     Vascular: Carotid bruit (R sided) present.  Cardiovascular:     Rate and Rhythm: Normal rate and regular rhythm.     Pulses: Normal pulses.          Radial pulses are 2+ on the right side and 2+ on the left side.     Heart sounds: Normal heart sounds. No murmur heard. Pulmonary:     Effort: Pulmonary effort is normal. No respiratory distress.     Breath sounds: Normal breath sounds. No wheezing, rhonchi or rales.  Abdominal:     General: Bowel sounds  are normal. There is no distension.     Palpations: Abdomen is soft. There is no mass.     Tenderness: There is no abdominal tenderness. There is no guarding or rebound.     Hernia: No hernia is present.  Musculoskeletal:        General: Normal range of motion.     Cervical back: Normal range of motion and neck supple.     Right lower leg: No edema.     Left lower leg: No edema.  Lymphadenopathy:     Cervical: No cervical adenopathy.  Skin:    General: Skin is warm  and dry.     Findings: No rash.  Neurological:     General: No focal deficit present.     Mental Status: He is alert and oriented to person, place, and time.  Psychiatric:        Mood and Affect: Mood normal.        Behavior: Behavior normal.        Thought Content: Thought content normal.        Judgment: Judgment normal.       Results for orders placed or performed in visit on 07/25/22  Parathyroid hormone, intact (no Ca)  Result Value Ref Range   PTH 39 16 - 77 pg/mL  VITAMIN D 25 Hydroxy (Vit-D Deficiency, Fractures)  Result Value Ref Range   VITD 46.46 30.00 - 100.00 ng/mL  Microalbumin / creatinine urine ratio  Result Value Ref Range   Microalb, Ur 4.0 (H) 0.0 - 1.9 mg/dL   Creatinine,U 118.5 mg/dL   Microalb Creat Ratio 3.4 0.0 - 30.0 mg/g  CBC with Differential/Platelet  Result Value Ref Range   WBC 5.5 4.0 - 10.5 K/uL   RBC 4.88 4.22 - 5.81 Mil/uL   Hemoglobin 14.7 13.0 - 17.0 g/dL   HCT 45.0 39.0 - 52.0 %   MCV 92.1 78.0 - 100.0 fl   MCHC 32.7 30.0 - 36.0 g/dL   RDW 13.8 11.5 - 15.5 %   Platelets 217.0 150.0 - 400.0 K/uL   Neutrophils Relative % 35.2 (L) 43.0 - 77.0 %   Lymphocytes Relative 41.5 12.0 - 46.0 %   Monocytes Relative 12.5 (H) 3.0 - 12.0 %   Eosinophils Relative 10.1 (H) 0.0 - 5.0 %   Basophils Relative 0.7 0.0 - 3.0 %   Neutro Abs 1.9 1.4 - 7.7 K/uL   Lymphs Abs 2.3 0.7 - 4.0 K/uL   Monocytes Absolute 0.7 0.1 - 1.0 K/uL   Eosinophils Absolute 0.6 0.0 - 0.7 K/uL   Basophils  Absolute 0.0 0.0 - 0.1 K/uL  Comprehensive metabolic panel  Result Value Ref Range   Sodium 140 135 - 145 mEq/L   Potassium 4.5 3.5 - 5.1 mEq/L   Chloride 104 96 - 112 mEq/L   CO2 28 19 - 32 mEq/L   Glucose, Bld 87 70 - 99 mg/dL   BUN 22 6 - 23 mg/dL   Creatinine, Ser 1.26 0.40 - 1.50 mg/dL   Total Bilirubin 0.6 0.2 - 1.2 mg/dL   Alkaline Phosphatase 78 39 - 117 U/L   AST 26 0 - 37 U/L   ALT 22 0 - 53 U/L   Total Protein 7.0 6.0 - 8.3 g/dL   Albumin 4.3 3.5 - 5.2 g/dL   GFR 57.05 (L) >60.00 mL/min   Calcium 9.3 8.4 - 10.5 mg/dL  Lipid panel  Result Value Ref Range   Cholesterol 224 (H) 0 - 200 mg/dL   Triglycerides 89.0 0.0 - 149.0 mg/dL   HDL 65.10 >39.00 mg/dL   VLDL 17.8 0.0 - 40.0 mg/dL   LDL Cholesterol 141 (H) 0 - 99 mg/dL   Total CHOL/HDL Ratio 3    NonHDL 158.40     Assessment & Plan:   Problem List Items Addressed This Visit     Health maintenance examination - Primary (Chronic)    Preventative protocols reviewed and updated unless pt declined. Discussed healthy diet and lifestyle.       Advanced directives, counseling/discussion (Chronic)    Advanced directive: has at home. HCPOA would be wife. Asked to  bring copy to update chart.      HLD (hyperlipidemia)    Chronic, off medication. Reviewed ASCVD risk with patient. He will work on diet choices to improve levels. Reassess at 6 wk lab visit.  The 10-year ASCVD risk score (Arnett DK, et al., 2019) is: 23.6%   Values used to calculate the score:     Age: 59 years     Sex: Male     Is Non-Hispanic African American: No     Diabetic: No     Tobacco smoker: No     Systolic Blood Pressure: 465 mmHg     Is BP treated: Yes     HDL Cholesterol: 65.1 mg/dL     Total Cholesterol: 224 mg/dL       Relevant Orders   Lipid panel   Apolipoprotein B   Elevated prostate specific antigen (PSA)    Followed regularly by urology.      Chronic kidney disease (CKD), stage III (moderate) (HCC)    Non-microalbuminuric.   Chronic, mild. Will continue to monitor.       Essential hypertension    Chronic, stable on amlodipine - continue.       Vitamin D deficiency    Continue vitamin D 2000 IU daily.       Eosinophilia    Mild, ongoing.  Consider periph smear next labwork.       Bilateral sensorineural hearing loss    Wears hearing aides, planned updating this year.       Right carotid bruit    Check carotid US       Relevant Orders   VAS US CAROTID     No orders of the defined types were placed in this encounter.  Orders Placed This Encounter  Procedures   Lipid panel    Standing Status:   Future    Standing Expiration Date:   07/31/2023   Apolipoprotein B    Standing Status:   Future    Standing Expiration Date:   07/31/2023     Patient instructions: Bring [place] copy of your advanced directive  We will order neck artery ultrasound  in [place]  Consider cholesterol medicine, work on diet to lower cholesterol (increased fiber and legumes).  You are doing well today.  Return in 6 months for lab visit only to recheck cholesterol levels.  Return in 1 year for next physical.  Follow up plan: Return in about 1 year (around 07/31/2023) for annual exam, prior fasting for blood work, medicare wellness visit.  Ria Bush, MD

## 2022-07-30 NOTE — Assessment & Plan Note (Signed)
Continue vitamin-D 2000 IU daily.

## 2022-07-30 NOTE — Assessment & Plan Note (Signed)
Mild, ongoing.  Consider periph smear next labwork.

## 2022-08-22 ENCOUNTER — Ambulatory Visit: Payer: PPO | Attending: Family Medicine

## 2022-08-22 DIAGNOSIS — R0989 Other specified symptoms and signs involving the circulatory and respiratory systems: Secondary | ICD-10-CM

## 2022-08-23 DIAGNOSIS — H903 Sensorineural hearing loss, bilateral: Secondary | ICD-10-CM | POA: Diagnosis not present

## 2022-08-26 ENCOUNTER — Encounter: Payer: Self-pay | Admitting: Family Medicine

## 2022-09-27 ENCOUNTER — Other Ambulatory Visit: Payer: Self-pay | Admitting: Family Medicine

## 2022-09-27 ENCOUNTER — Encounter: Payer: Self-pay | Admitting: Family Medicine

## 2022-09-27 DIAGNOSIS — I10 Essential (primary) hypertension: Secondary | ICD-10-CM

## 2022-09-27 MED ORDER — AMLODIPINE BESYLATE 10 MG PO TABS: 10.0000 mg | ORAL_TABLET | Freq: Every day | ORAL | 0 refills | Status: DC

## 2022-09-30 NOTE — Telephone Encounter (Signed)
Updated pt's chart.  

## 2022-10-03 ENCOUNTER — Other Ambulatory Visit: Payer: Self-pay | Admitting: *Deleted

## 2022-10-03 ENCOUNTER — Other Ambulatory Visit: Payer: PPO

## 2022-10-03 DIAGNOSIS — R972 Elevated prostate specific antigen [PSA]: Secondary | ICD-10-CM | POA: Diagnosis not present

## 2022-10-04 LAB — PSA: Prostate Specific Ag, Serum: 6.4 ng/mL — ABNORMAL HIGH (ref 0.0–4.0)

## 2022-10-08 ENCOUNTER — Ambulatory Visit: Payer: PPO | Admitting: Urology

## 2022-10-08 ENCOUNTER — Encounter: Payer: Self-pay | Admitting: Urology

## 2022-10-08 VITALS — BP 151/82 | HR 67 | Ht 72.0 in | Wt 170.0 lb

## 2022-10-08 DIAGNOSIS — R351 Nocturia: Secondary | ICD-10-CM | POA: Diagnosis not present

## 2022-10-08 DIAGNOSIS — N429 Disorder of prostate, unspecified: Secondary | ICD-10-CM | POA: Diagnosis not present

## 2022-10-08 DIAGNOSIS — R972 Elevated prostate specific antigen [PSA]: Secondary | ICD-10-CM

## 2022-10-08 NOTE — Progress Notes (Signed)
I, William Shelton,acting as a scribe for William Espy, MD.,have documented all relevant documentation on the behalf of William Espy, MD,as directed by  William Espy, MD while in the presence of William Espy, MD.   I, Amy L Pierron,acting as a scribe for William Espy, MD.,have documented all relevant documentation on the behalf of William Espy, MD,as directed by  William Espy, MD while in the presence of William Espy, MD.   10/08/22 9:29 AM   William Shelton Drafts 1951/08/29 [id number]  Referring provider: Ria Bush, MD [place],  [place] [id number]  Chief Complaint  Patient presents with   Elevated PSA    HPI: 72 year-old male with a personal history of elevated PSA who returns today for annual follow up.  Prostate MRI on 11/07/2016 revealed no high-grade carcinoma and prostatic capsule is intact.    His most recent PSA was 6.4 on 10/03/22.  He notes nocturia 2-3x which is not overly bothersome for him. Otherwise no other major urinary issues.   PSA trend:  6.4 on 10/03/22 4.9 on 09/27/21 4.75 on 04/27/21 5.1 on 09/04/20 6.1 on 03/01/20 6.1 on 08/30/19 5.5 on 03/11/19 6.6 on 08/21/2018 4.7  On 08/20/2017 6.0 on 02/17/17   6.6 on 10/11/16  --> MRI 10/2016 negative, no high grade lesions 6.7 on 03/25/16 4.95 on 10/06/15 5.8 on 10/16 4.20 October 2014  --> negative prostate biopsy (unable to pull up records, TRUS volume unknown) 4.22 July 2014 2.04 2014 4.25 2013   PMH: Past Medical History:  Diagnosis Date   Allergic rhinitis, cause unspecified    Arthritis    BPH (benign prostatic hypertrophy)    BPV (benign positional vertigo) 09/28/2015   Chronic kidney disease (CKD), stage III (moderate) (HCC) 10/22/2011   Clotting disorder ([facility]) 09/2019   left leg DVT   ED (erectile dysfunction)    Elevated prostate specific antigen (PSA) 2016   eval by Dr Erlene Quan - s/p benign biopsy    Essential hypertension 08/04/2014   Hematuria, microscopic     HLD (hyperlipidemia)    Hydrocele, left    IBS (irritable bowel syndrome)    Internal hemorrhoid     Surgical History: Past Surgical History:  Procedure Laterality Date   COLONOSCOPY  07/12/2008   Internal hemorrhoids---Dr. Fuller Plan   COLONOSCOPY  09/2021   ?TA, lipoma, int hem, diverticulosis, rpt 7 yrs ([facility])   INGUINAL HERNIA REPAIR  07/21/2002   Left---Dr. Bary Castilla    Home Medications:  Allergies as of 10/08/2022   No Known Allergies      Medication List        Accurate as of October 08, 2022  9:29 AM. If you have any questions, ask your nurse or doctor.          amLODipine 10 MG tablet Commonly known as: NORVASC Take 1 tablet (10 mg total) by mouth daily.   FISH OIL PO Take 1 capsule by mouth daily.   multivitamin capsule Take 1 capsule by mouth daily.   Vitamin D 50 MCG (2000 UT) Caps Take 1 capsule (2,000 Units total) by mouth daily.        Family History: Family History  Problem Relation Age of Onset   Hypertension Father    Prostate cancer Father 46       Radiation seeds   Macular degeneration Father    Hyperlipidemia Brother    Other Brother        BPH   Diabetes Neg Hx  Breast cancer Neg Hx    Depression Neg Hx    Alcohol abuse Neg Hx    Stroke Neg Hx    CAD Neg Hx    Rectal cancer Neg Hx    Stomach cancer Neg Hx    Colon polyps Neg Hx     Social History:  reports that he has never smoked. He has never used smokeless tobacco. He reports that he does not drink alcohol and does not use drugs.   Physical Exam: BP (!) 151/82   Pulse 67   Ht 6' (1.829 m)   Wt 170 lb (77.1 kg)   BMI 23.06 kg/m   Constitutional:  Alert and oriented, No acute distress. HEENT: [place] AT, moist mucus membranes.  Trachea midline, no masses. Rectal: stable, some mild induration of the right lateral ridge (unchanged) Neurologic: Grossly intact, no focal deficits, moving all 4 extremities. Psychiatric: Normal mood and affect.  Assessment & Plan:     Long history of elevated/fluctuating PSA's - PSA remains on the higher side but within his range remains fairly healthy. - Continue annual PSA screening and monitoring.  2. History of abnormal rectal exam - Slightly abnormal exam with firm lateral ridge, noted on previous exams.   3. Nocturia x 2-3 - Does not seem to bother him. Discussed behavior modification.    Return in about 1 year (around 10/09/2023) for PSA.  I have reviewed the above documentation for accuracy and completeness, and I agree with the above.   William Espy, MD    [facility] [address] [place], [place] [address] [phone]

## 2022-12-26 ENCOUNTER — Other Ambulatory Visit: Payer: Self-pay | Admitting: Family Medicine

## 2022-12-26 DIAGNOSIS — I10 Essential (primary) hypertension: Secondary | ICD-10-CM

## 2023-01-28 ENCOUNTER — Other Ambulatory Visit (INDEPENDENT_AMBULATORY_CARE_PROVIDER_SITE_OTHER): Payer: PPO

## 2023-01-28 DIAGNOSIS — E7800 Pure hypercholesterolemia, unspecified: Secondary | ICD-10-CM

## 2023-01-28 LAB — LIPID PANEL
Cholesterol: 203 mg/dL — ABNORMAL HIGH (ref 0–200)
HDL: 50.5 mg/dL (ref >39.00)
LDL Cholesterol: 124 mg/dL — ABNORMAL HIGH (ref 0–99)
NonHDL: 152.13
Total CHOL/HDL Ratio: 4
Triglycerides: 142 mg/dL (ref 0.0–149.0)
VLDL: 28.4 mg/dL (ref 0.0–40.0)

## 2023-01-29 ENCOUNTER — Encounter: Payer: Self-pay | Admitting: Family Medicine

## 2023-01-29 LAB — APOLIPOPROTEIN B: Apolipoprotein B: 100 mg/dL — ABNORMAL HIGH (ref <90)

## 2023-01-29 NOTE — Telephone Encounter (Signed)
Replied via result note.

## 2023-02-26 DIAGNOSIS — H53141 Visual discomfort, right eye: Secondary | ICD-10-CM | POA: Diagnosis not present

## 2023-03-03 ENCOUNTER — Other Ambulatory Visit: Payer: Self-pay | Admitting: Optometry

## 2023-03-03 ENCOUNTER — Ambulatory Visit
Admission: RE | Admit: 2023-03-03 | Discharge: 2023-03-03 | Disposition: A | Discharge status: Home / Self Care | Payer: PPO | Source: Ambulatory Visit | Attending: Optometry | Admitting: Optometry

## 2023-03-03 ENCOUNTER — Telehealth: Payer: Self-pay

## 2023-03-03 DIAGNOSIS — H4900 Third [oculomotor] nerve palsy, unspecified eye: Secondary | ICD-10-CM | POA: Diagnosis not present

## 2023-03-03 DIAGNOSIS — H4901 Third [oculomotor] nerve palsy, right eye: Secondary | ICD-10-CM

## 2023-03-03 LAB — POCT I-STAT CREATININE: Creatinine, Ser: 1.3 mg/dL — ABNORMAL HIGH (ref 0.61–1.24)

## 2023-03-03 MED ORDER — IOHEXOL 350 MG/ML SOLN
75.0000 mL | Freq: Once | INTRAVENOUS | Status: AC | PRN
  Administered 2023-03-03: 75 mL via INTRAVENOUS

## 2023-03-03 NOTE — Telephone Encounter (Signed)
Received a phone call from Katie at [facility] eye.  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 Provider would like to know if Dr. Sharen Hones would prefer to order tests so that they would be available in [facility].  Please see list of needed tests: CBC w/ Diff, ESR; CRP, CT, CTA

## 2023-03-03 NOTE — Addendum Note (Signed)
Addended by: Benedict Needy on: 03/03/2023 12:55 PM   Modules accepted: Orders

## 2023-03-03 NOTE — Telephone Encounter (Signed)
Spoke with Dr. Sharen Hones he would prefer that [facility] order and schedule the CT and CTA.  Dr. Sharen Hones will order the needed labs and we will schedule pt for lab appointment in office.  Called and spoke with Florentina Addison at [facility] she verbalized understanding.    Dr. Gwyndolyn Saxon pended the labs that eye doctor requested and attempted to schedule pt a lab appointment.  Left him a message to call the office back for lab appointment.

## 2023-03-03 NOTE — Addendum Note (Signed)
Addended by: Eustaquio Boyden on: 03/03/2023 01:48 PM   Modules accepted: Orders

## 2023-03-03 NOTE — Telephone Encounter (Signed)
Patient has been scheduled

## 2023-03-03 NOTE — Telephone Encounter (Signed)
Thank you! Labs ordered 

## 2023-03-04 ENCOUNTER — Other Ambulatory Visit (INDEPENDENT_AMBULATORY_CARE_PROVIDER_SITE_OTHER): Payer: PPO

## 2023-03-04 DIAGNOSIS — H4901 Third [oculomotor] nerve palsy, right eye: Secondary | ICD-10-CM | POA: Diagnosis not present

## 2023-03-05 LAB — CBC WITH DIFFERENTIAL/PLATELET
Basophils Absolute: 0 10*3/uL (ref 0.0–0.1)
Basophils Relative: 0.5 % (ref 0.0–3.0)
Eosinophils Absolute: 0.3 10*3/uL (ref 0.0–0.7)
Eosinophils Relative: 4.1 % (ref 0.0–5.0)
HCT: 43.8 % (ref 39.0–52.0)
Hemoglobin: 14.3 g/dL (ref 13.0–17.0)
Lymphocytes Relative: 33.6 % (ref 12.0–46.0)
Lymphs Abs: 2.5 10*3/uL (ref 0.7–4.0)
MCHC: 32.8 g/dL (ref 30.0–36.0)
MCV: 92.2 fl (ref 78.0–100.0)
Monocytes Absolute: 0.7 10*3/uL (ref 0.1–1.0)
Monocytes Relative: 9.1 % (ref 3.0–12.0)
Neutro Abs: 4 10*3/uL (ref 1.4–7.7)
Neutrophils Relative %: 52.7 % (ref 43.0–77.0)
Platelets: 229 10*3/uL (ref 150.0–400.0)
RBC: 4.75 Mil/uL (ref 4.22–5.81)
RDW: 14 % (ref 11.5–15.5)
WBC: 7.6 10*3/uL (ref 4.0–10.5)

## 2023-03-05 LAB — SEDIMENTATION RATE: Sed Rate: 4 mm/hr (ref 0–20)

## 2023-03-05 LAB — C-REACTIVE PROTEIN: CRP: <1 mg/dL (ref 0.5–20.0)

## 2023-03-10 DIAGNOSIS — H4901 Third [oculomotor] nerve palsy, right eye: Secondary | ICD-10-CM | POA: Diagnosis not present

## 2023-04-16 ENCOUNTER — Encounter (INDEPENDENT_AMBULATORY_CARE_PROVIDER_SITE_OTHER): Payer: Self-pay

## 2023-05-15 ENCOUNTER — Ambulatory Visit (INDEPENDENT_AMBULATORY_CARE_PROVIDER_SITE_OTHER): Payer: PPO

## 2023-05-15 VITALS — Ht 72.0 in | Wt 165.0 lb

## 2023-05-15 DIAGNOSIS — Z0000 Encounter for general adult medical examination without abnormal findings: Secondary | ICD-10-CM | POA: Diagnosis not present

## 2023-05-15 NOTE — Patient Instructions (Signed)
William Shelton , Thank you for taking time to come for your Medicare Wellness Visit. I appreciate your ongoing commitment to your health goals. Please review the following plan we discussed and let me know if I can assist you in the future.   Referrals/Orders/Follow-Ups/Clinician Recommendations: Aim for 30 minutes of exercise or brisk walking, 6-8 glasses of water, and 5 servings of fruits and vegetables each day.   This is a list of the screening recommended for you and due dates:  Health Maintenance  Topic Date Due   Zoster (Shingles) Vaccine (1 of 2) 03/22/2000   DTaP/Tdap/Td vaccine (2 - Tdap) 05/30/2018   Stool Blood Test  09/24/2022   COVID-19 Vaccine (7 - 2023-24 season) 10/27/2022   Medicare Annual Wellness Visit  05/15/2023   Flu Shot  04/17/2023   Colon Cancer Screening  09/24/2028   Pneumonia Vaccine  Completed   Hepatitis C Screening  Completed   HPV Vaccine  Aged Out    Advanced directives: (Copy Requested) Please bring a copy of your health care power of attorney and living will to the office to be added to your chart at your convenience.  Next Medicare Annual Wellness Visit scheduled for next year: Yes

## 2023-05-15 NOTE — Progress Notes (Signed)
Subjective:   William Shelton is a 72 y.o. male who presents for Medicare Annual/Subsequent preventive examination.  Visit Complete: Virtual  I connected with  [name] on 05/15/23 by a audio enabled telemedicine application and verified that I am speaking with the correct person using two identifiers.  Patient Location: Home  Provider Location: Home Office  I discussed the limitations of evaluation and management by telemedicine. The patient expressed understanding and agreed to proceed.  Vital Signs: Because this visit was a virtual/telehealth visit, some criteria may be missing or patient reported. Any vitals not documented were not able to be obtained and vitals that have been documented are patient reported.     Review of Systems      Cardiac Risk Factors include: advanced age (>36men, >67 women);hypertension;male gender;dyslipidemia;sedentary lifestyle     Objective:    Today's Vitals   05/15/23 1015  Weight: 165 lb (74.8 kg)  Height: 6' (1.829 m)   Body mass index is 22.38 kg/m.     05/15/2023   10:20 AM 05/14/2022    9:30 AM 03/14/2020    8:20 AM  Advanced Directives  Does Patient Have a Medical Advance Directive? Yes No Yes  Type of Estate agent of [place];Living will  Healthcare Power of [name];Living will  Copy of Healthcare Power of Attorney in Chart? No - copy requested  No - copy requested  Would patient like information on creating a medical advance directive?  No - Patient declined     Current Medications (verified) Outpatient Encounter Medications as of 05/15/2023  Medication Sig   amLODipine (NORVASC) 10 MG tablet TAKE 1 TABLET BY MOUTH EVERY DAY   Cholecalciferol (VITAMIN D) 2000 units CAPS Take 1 capsule (2,000 Units total) by mouth daily.   Multiple Vitamin (MULTIVITAMIN) capsule Take 1 capsule by mouth daily.   Omega-3 Fatty Acids (FISH OIL PO) Take 1 capsule by mouth daily.   No facility-administered  encounter medications on file as of 05/15/2023.    Allergies (verified) Patient has no known allergies.   History: Past Medical History:  Diagnosis Date   Allergic rhinitis, cause unspecified    Arthritis    BPH (benign prostatic hypertrophy)    BPV (benign positional vertigo) 09/28/2015   Chronic kidney disease (CKD), stage III (moderate) (HCC) 10/22/2011   Clotting disorder (HCC) 09/2019   left leg DVT   ED (erectile dysfunction)    Elevated prostate specific antigen (PSA) 2016   eval by Dr Apolinar Junes - s/p benign biopsy    Essential hypertension 08/04/2014   Hematuria, microscopic    HLD (hyperlipidemia)    Hydrocele, left    IBS (irritable bowel syndrome)    Internal hemorrhoid    Past Surgical History:  Procedure Laterality Date   COLONOSCOPY  07/12/2008   Internal hemorrhoids---Dr. [name]   COLONOSCOPY  09/2021   ?TA, lipoma, int hem, diverticulosis, rpt 7 yrs ([facility])   INGUINAL HERNIA REPAIR  07/21/2002   Left---Dr. Lemar Livings   Family History  Problem Relation Age of Onset   Hypertension Father    Prostate cancer Father 68       Radiation seeds   Macular degeneration Father    Hyperlipidemia Brother    Other Brother        BPH   Diabetes Neg Hx    Breast cancer Neg Hx    Depression Neg Hx    Alcohol abuse Neg Hx    Stroke Neg Hx    CAD Neg  Hx    Rectal cancer Neg Hx    Stomach cancer Neg Hx    Colon polyps Neg Hx    Social History   Socioeconomic History   Marital status: Married    Spouse name: Not on file   Number of children: 1   Years of education: Not on file   Highest education level: Not on file  Occupational History   Occupation: Retired  Tobacco Use   Smoking status: Never   Smokeless tobacco: Never  Vaping Use   Vaping status: Never Used  Substance and Sexual Activity   Alcohol use: No   Drug use: No   Sexual activity: Not on file  Other Topics Concern   Not on file  Social History Narrative   Lives in [place]. Married; lives with  wife; 1 son Retired Scientist, clinical (histocompatibility and immunogenetics) ; Activity: walks daily Diet: good water, fruits/vegetables daily.      No smoking; ocassional beer.    Social Determinants of Health   Financial Resource Strain: Low Risk  (05/15/2023)   Overall Financial Resource Strain (CARDIA)    Difficulty of Paying Living Expenses: Not hard at all  Food Insecurity: No Food Insecurity (05/15/2023)   Hunger Vital Sign    Worried About Running Out of Food in the Last Year: Never true    Ran Out of Food in the Last Year: Never true  Transportation Needs: No Transportation Needs (05/15/2023)   PRAPARE - Administrator, Civil Service (Medical): No    Lack of Transportation (Non-Medical): No  Physical Activity: Inactive (05/15/2023)   Exercise Vital Sign    Days of Exercise per Week: 0 days    Minutes of Exercise per Session: 0 min  Stress: No Stress Concern Present (05/15/2023)   [employer] of Occupational Health - Occupational Stress Questionnaire    Feeling of Stress : Not at all  Social Connections: Moderately Integrated (05/15/2023)   Social Connection and Isolation Panel [NHANES]    Frequency of Communication with Friends and Family: More than three times a week    Frequency of Social Gatherings with Friends and Family: Twice a week    Attends Religious Services: Never    Database administrator or Organizations: Yes    Attends Engineer, structural: More than 4 times per year    Marital Status: Married    Tobacco Counseling Counseling given: Not Answered   Clinical Intake:  Pre-visit preparation completed: Yes  Pain : No/denies pain     BMI - recorded: 22.38 Nutritional Status: BMI of 19-24  Normal Nutritional Risks: None Diabetes: No  How often do you need to have someone help you when you read instructions, pamphlets, or other written materials from your doctor or pharmacy?: 1 - Never  Interpreter Needed?: No  Information entered by ::  C.Ebbie Sorenson LPN   Activities of Daily Living    05/15/2023   10:20 AM  In your present state of health, do you have any difficulty performing the following activities:  Hearing? 0  Vision? 0  Difficulty concentrating or making decisions? 0  Walking or climbing stairs? 0  Dressing or bathing? 0  Doing errands, shopping? 0  Preparing Food and eating ? N  Using the Toilet? N  In the past six months, have you accidently leaked urine? N  Do you have problems with loss of bowel control? N  Managing your Medications? N  Managing your Finances? N  Housekeeping or managing your Housekeeping? N  Patient Care Team: Eustaquio Boyden, MD as PCP - General (Family Medicine)  Indicate any recent Medical Services you may have received from other than [facility] providers in the past year (date may be approximate).     Assessment:   This is a routine wellness examination for Kahlil.  Hearing/Vision screen Hearing Screening - Comments:: Bilateral hearing aids Vision Screening - Comments:: Glasses - [facility] - UTD on eye exams  Dietary issues and exercise activities discussed:     Goals Addressed             This Visit's Progress    Patient Stated       Increase exercise.       Depression Screen    05/15/2023   10:16 AM 05/14/2022    9:29 AM 05/04/2021   10:38 AM 03/14/2020    8:22 AM 10/01/2019    8:15 AM 11/10/2018    2:09 PM 11/05/2017   10:38 AM  PHQ 2/9 Scores  PHQ - 2 Score 0 0 0 0 0 0 0  PHQ- 9 Score  0  0       Fall Risk    05/15/2023   10:18 AM 05/14/2022    9:31 AM 05/04/2021   10:38 AM 03/14/2020    8:22 AM 10/01/2019    8:15 AM  Fall Risk   Falls in the past year? 0 0 0 0 0  Number falls in past yr: 0 0  0   Injury with Fall? 0 0  0   Risk for fall due to : No Fall Risks No Fall Risks  Medication side effect   Follow up Falls prevention discussed Falls evaluation completed  Falls evaluation completed;Falls prevention discussed     MEDICARE RISK AT  HOME: Medicare Risk at Home Any stairs in or around the home?: Yes If so, are there any without handrails?: No Home free of loose throw rugs in walkways, pet beds, electrical cords, etc?: Yes Adequate lighting in your home to reduce risk of falls?: Yes Life alert?: No Use of a cane, walker or w/c?: No Grab bars in the bathroom?: No Shower chair or bench in shower?: No Elevated toilet seat or a handicapped toilet?: Yes  TIMED UP AND GO:  Was the test performed?  No    Cognitive Function:    03/14/2020    8:24 AM  MMSE - Mini Mental State Exam  Orientation to time 5  Orientation to Place 5  Registration 3  Attention/ Calculation 5  Recall 3  Language- repeat 1        05/15/2023   10:21 AM 05/14/2022    9:35 AM  6CIT Screen  What Year? 0 points 0 points  What month? 0 points 0 points  What time? 0 points 0 points  Count back from 20 0 points 0 points  Months in reverse 0 points 0 points  Repeat phrase 0 points 2 points  Total Score 0 points 2 points    Immunizations Immunization History  Administered Date(s) Administered   COVID-19, mRNA, vaccine(Comirnaty)12 years and older 06/26/2022   Fluad Quad(high Dose 65+) 07/01/2019, 07/04/2020, 06/26/2022   Influenza Split 06/05/2011, 07/22/2012   Influenza Whole 06/07/2009, 08/04/2010   Influenza, High Dose Seasonal PF 07/31/2017, 07/10/2018, 07/06/2021   Influenza,inj,Quad PF,6+ Mos 08/04/2014, 08/03/2015, 06/21/2016   PFIZER Comirnaty(Gray Top)Covid-19 Tri-Sucrose Vaccine 06/26/2022   PFIZER(Purple Top)SARS-COV-2 Vaccination 10/23/2019, 11/17/2019, 06/26/2020, 04/15/2021   Pfizer Covid-19 Vaccine Bivalent Booster 28yrs & up 07/23/2021   Pneumococcal Conjugate-13  10/12/2015   Pneumococcal Polysaccharide-23 10/31/2016   Respiratory Syncytial Virus Vaccine,Recomb Aduvanted(Arexvy) 09/27/2022   Td 05/30/2008   Zoster, Live 08/04/2014    TDAP status: Due, Education has been provided regarding the importance of this  vaccine. Advised may receive this vaccine at local pharmacy or Health Dept. Aware to provide a copy of the vaccination record if obtained from local pharmacy or Health Dept. Verbalized acceptance and understanding.  Flu Vaccine status: Due, Education has been provided regarding the importance of this vaccine. Advised may receive this vaccine at local pharmacy or Health Dept. Aware to provide a copy of the vaccination record if obtained from local pharmacy or Health Dept. Verbalized acceptance and understanding.  Pneumococcal vaccine status: Up to date  Covid-19 vaccine status: Information provided on how to obtain vaccines.   Qualifies for Shingles Vaccine? Yes   Zostavax completed Yes   Shingrix Completed?: No.    Education has been provided regarding the importance of this vaccine. Patient has been advised to call insurance company to determine out of pocket expense if they have not yet received this vaccine. Advised may also receive vaccine at local pharmacy or Health Dept. Verbalized acceptance and understanding.  Screening Tests Health Maintenance  Topic Date Due   Zoster Vaccines- Shingrix (1 of 2) 03/22/2000   DTaP/Tdap/Td (2 - Tdap) 05/30/2018   COLON CANCER SCREENING ANNUAL FOBT  09/24/2022   COVID-19 Vaccine (7 - 2023-24 season) 10/27/2022   INFLUENZA VACCINE  04/17/2023   Medicare Annual Wellness (AWV)  05/14/2024   Colonoscopy  09/24/2028   Pneumonia Vaccine 55+ Years old  Completed   Hepatitis C Screening  Completed   HPV VACCINES  Aged Out    Health Maintenance  Health Maintenance Due  Topic Date Due   Zoster Vaccines- Shingrix (1 of 2) 03/22/2000   DTaP/Tdap/Td (2 - Tdap) 05/30/2018   COLON CANCER SCREENING ANNUAL FOBT  09/24/2022   COVID-19 Vaccine (7 - 2023-24 season) 10/27/2022   INFLUENZA VACCINE  04/17/2023    Colorectal cancer screening: Type of screening: Colonoscopy. Completed 09/24/21. Repeat every 0 years Pt declines future screenings  Lung Cancer  Screening: (Low Dose CT Chest recommended if Age 60-80 years, 20 pack-year currently smoking OR have quit w/in 15years.) does not qualify.   Lung Cancer Screening Referral:    Additional Screening:  Hepatitis C Screening: does qualify; Completed 10/06/15  Vision Screening: Recommended annual ophthalmology exams for early detection of glaucoma and other disorders of the eye. Is the patient up to date with their annual eye exam?  Yes  Who is the provider or what is the name of the office in which the patient attends annual eye exams? [facility] If pt is not established with a provider, would they like to be referred to a provider to establish care? Yes .   Dental Screening: Recommended annual dental exams for proper oral hygiene  Diabetic Foot Exam:   Community Resource Referral / Chronic Care Management: CRR required this visit?  No   CCM required this visit?  No     Plan:     I have personally reviewed and noted the following in the patient's chart:   Medical and social history Use of alcohol, tobacco or illicit drugs  Current medications and supplements including opioid prescriptions. Patient is not currently taking opioid prescriptions. Functional ability and status Nutritional status Physical activity Advanced directives List of other physicians Hospitalizations, surgeries, and ER visits in previous 12 months Vitals Screenings to include cognitive, depression,  and falls Referrals and appointments  In addition, I have reviewed and discussed with patient certain preventive protocols, quality metrics, and best practice recommendations. A written personalized care plan for preventive services as well as general preventive health recommendations were provided to patient.     Maryan Puls, LPN   [id number]   After Visit Summary: (MyChart) Due to this being a telephonic visit, the after visit summary with patients personalized plan was offered to patient via  MyChart   Nurse Notes: none

## 2023-05-30 DIAGNOSIS — D3131 Benign neoplasm of right choroid: Secondary | ICD-10-CM | POA: Diagnosis not present

## 2023-05-30 DIAGNOSIS — H5203 Hypermetropia, bilateral: Secondary | ICD-10-CM | POA: Diagnosis not present

## 2023-05-30 DIAGNOSIS — D3132 Benign neoplasm of left choroid: Secondary | ICD-10-CM | POA: Diagnosis not present

## 2023-05-30 DIAGNOSIS — H2513 Age-related nuclear cataract, bilateral: Secondary | ICD-10-CM | POA: Diagnosis not present

## 2023-05-30 DIAGNOSIS — H524 Presbyopia: Secondary | ICD-10-CM | POA: Diagnosis not present

## 2023-07-24 ENCOUNTER — Encounter: Payer: Self-pay | Admitting: Family Medicine

## 2023-07-24 NOTE — Telephone Encounter (Signed)
Updated pt's chart.  

## 2023-07-26 ENCOUNTER — Other Ambulatory Visit: Payer: Self-pay | Admitting: Family Medicine

## 2023-07-26 DIAGNOSIS — E7800 Pure hypercholesterolemia, unspecified: Secondary | ICD-10-CM

## 2023-07-26 DIAGNOSIS — E559 Vitamin D deficiency, unspecified: Secondary | ICD-10-CM

## 2023-07-26 DIAGNOSIS — N1831 Chronic kidney disease, stage 3a: Secondary | ICD-10-CM

## 2023-07-28 ENCOUNTER — Encounter: Payer: Self-pay | Admitting: Family Medicine

## 2023-07-28 ENCOUNTER — Other Ambulatory Visit (INDEPENDENT_AMBULATORY_CARE_PROVIDER_SITE_OTHER): Payer: PPO

## 2023-07-28 DIAGNOSIS — N1831 Chronic kidney disease, stage 3a: Secondary | ICD-10-CM | POA: Diagnosis not present

## 2023-07-28 DIAGNOSIS — E559 Vitamin D deficiency, unspecified: Secondary | ICD-10-CM

## 2023-07-28 DIAGNOSIS — E7800 Pure hypercholesterolemia, unspecified: Secondary | ICD-10-CM

## 2023-07-28 LAB — COMPREHENSIVE METABOLIC PANEL
ALT: 20 U/L (ref 0–53)
AST: 21 U/L (ref 0–37)
Albumin: 4.5 g/dL (ref 3.5–5.2)
Alkaline Phosphatase: 76 U/L (ref 39–117)
BUN: 19 mg/dL (ref 6–23)
CO2: 31 meq/L (ref 19–32)
Calcium: 9.6 mg/dL (ref 8.4–10.5)
Chloride: 106 meq/L (ref 96–112)
Creatinine, Ser: 1.3 mg/dL (ref 0.40–1.50)
GFR: 54.56 mL/min — ABNORMAL LOW (ref >60.00)
Glucose, Bld: 89 mg/dL (ref 70–99)
Potassium: 4.4 meq/L (ref 3.5–5.1)
Sodium: 143 meq/L (ref 135–145)
Total Bilirubin: 0.5 mg/dL (ref 0.2–1.2)
Total Protein: 7.3 g/dL (ref 6.0–8.3)

## 2023-07-28 LAB — LIPID PANEL
Cholesterol: 230 mg/dL — ABNORMAL HIGH (ref 0–200)
HDL: 58.1 mg/dL (ref >39.00)
LDL Cholesterol: 150 mg/dL — ABNORMAL HIGH (ref 0–99)
NonHDL: 171.93
Total CHOL/HDL Ratio: 4
Triglycerides: 110 mg/dL (ref 0.0–149.0)
VLDL: 22 mg/dL (ref 0.0–40.0)

## 2023-07-28 LAB — CBC WITH DIFFERENTIAL/PLATELET
Basophils Absolute: 0.1 10*3/uL (ref 0.0–0.1)
Basophils Relative: 1.2 % (ref 0.0–3.0)
Eosinophils Absolute: 0.4 10*3/uL (ref 0.0–0.7)
Eosinophils Relative: 7.5 % — ABNORMAL HIGH (ref 0.0–5.0)
HCT: 45.1 % (ref 39.0–52.0)
Hemoglobin: 15.1 g/dL (ref 13.0–17.0)
Lymphocytes Relative: 35.4 % (ref 12.0–46.0)
Lymphs Abs: 2.1 10*3/uL (ref 0.7–4.0)
MCHC: 33.4 g/dL (ref 30.0–36.0)
MCV: 93.8 fL (ref 78.0–100.0)
Monocytes Absolute: 0.6 10*3/uL (ref 0.1–1.0)
Monocytes Relative: 10.4 % (ref 3.0–12.0)
Neutro Abs: 2.7 10*3/uL (ref 1.4–7.7)
Neutrophils Relative %: 45.5 % (ref 43.0–77.0)
Platelets: 234 10*3/uL (ref 150.0–400.0)
RBC: 4.81 Mil/uL (ref 4.22–5.81)
RDW: 14.1 % (ref 11.5–15.5)
WBC: 5.9 10*3/uL (ref 4.0–10.5)

## 2023-07-28 LAB — MICROALBUMIN / CREATININE URINE RATIO
Creatinine,U: 71.4 mg/dL
Microalb Creat Ratio: 1.6 mg/g (ref 0.0–30.0)
Microalb, Ur: 1.1 mg/dL (ref 0.0–1.9)

## 2023-07-28 LAB — PHOSPHORUS: Phosphorus: 2.8 mg/dL (ref 2.3–4.6)

## 2023-07-28 LAB — VITAMIN D 25 HYDROXY (VIT D DEFICIENCY, FRACTURES): VITD: 46.73 ng/mL (ref 30.00–100.00)

## 2023-07-28 NOTE — Telephone Encounter (Signed)
Printed Living Will and placed in Dr Timoteo Expose box.

## 2023-08-01 LAB — LIPOPROTEIN A (LPA): Lipoprotein (a): 202 nmol/L — ABNORMAL HIGH (ref <75)

## 2023-08-01 LAB — PARATHYROID HORMONE, INTACT (NO CA): PTH: 37 pg/mL (ref 16–77)

## 2023-08-04 ENCOUNTER — Encounter: Payer: Self-pay | Admitting: Family Medicine

## 2023-08-04 ENCOUNTER — Ambulatory Visit: Payer: PPO | Admitting: Family Medicine

## 2023-08-04 VITALS — BP 132/80 | HR 62 | Temp 97.7°F | Ht 71.75 in | Wt 169.1 lb

## 2023-08-04 DIAGNOSIS — Z7189 Other specified counseling: Secondary | ICD-10-CM | POA: Diagnosis not present

## 2023-08-04 DIAGNOSIS — N1831 Chronic kidney disease, stage 3a: Secondary | ICD-10-CM | POA: Diagnosis not present

## 2023-08-04 DIAGNOSIS — I10 Essential (primary) hypertension: Secondary | ICD-10-CM

## 2023-08-04 DIAGNOSIS — R972 Elevated prostate specific antigen [PSA]: Secondary | ICD-10-CM

## 2023-08-04 DIAGNOSIS — E559 Vitamin D deficiency, unspecified: Secondary | ICD-10-CM | POA: Diagnosis not present

## 2023-08-04 DIAGNOSIS — D721 Eosinophilia, unspecified: Secondary | ICD-10-CM | POA: Diagnosis not present

## 2023-08-04 DIAGNOSIS — E7841 Elevated Lipoprotein(a): Secondary | ICD-10-CM | POA: Diagnosis not present

## 2023-08-04 DIAGNOSIS — Z0000 Encounter for general adult medical examination without abnormal findings: Secondary | ICD-10-CM

## 2023-08-04 DIAGNOSIS — H903 Sensorineural hearing loss, bilateral: Secondary | ICD-10-CM | POA: Diagnosis not present

## 2023-08-04 MED ORDER — AMLODIPINE BESYLATE 10 MG PO TABS: 10.0000 mg | ORAL_TABLET | Freq: Every day | ORAL | 4 refills | Status: DC

## 2023-08-04 NOTE — Assessment & Plan Note (Signed)
Advanced directive: brings living will, scanned 07/2023. Does not want prolonged life support if terminal condition. Wife [name] followed by son [name] are HCPOA.

## 2023-08-04 NOTE — Patient Instructions (Signed)
We will order coronary CT with calcium score at [facility] are doing well today Return as needed or in 1 year for next physical

## 2023-08-04 NOTE — Assessment & Plan Note (Signed)
Preventative protocols reviewed and updated unless pt declined. Discussed healthy diet and lifestyle.  

## 2023-08-04 NOTE — Progress Notes (Signed)
Ph: [phone] Fax: [phone]   Patient ID: William Shelton, male    DOB: 10/24/1950, 72 y.o.   MRN: [id number]  This visit was conducted in person.  BP 132/80   Pulse 62   Temp 97.7 F (36.5 C) (Oral)   Ht 5' 11.75" (1.822 m)   Wt 169 lb 2 oz (76.7 kg)   SpO2 99%   BMI 23.10 kg/m    CC: CPE Subjective:   HPI: William Shelton is a 72 y.o. male presenting on 08/04/2023 for Annual Exam (MCR prt 2 [AWV- 05/15/23].)   Saw health advisor 04/2023 for medicare wellness visit. Note reviewed.   No results found.  Flowsheet Row Clinical Support from 05/15/2023 in [facility] [place]  PHQ-2 Total Score 0          05/15/2023   10:18 AM 05/14/2022    9:31 AM 05/04/2021   10:38 AM 03/14/2020    8:22 AM 10/01/2019    8:15 AM  Fall Risk   Falls in the past year? 0 0 0 0 0  Number falls in past yr: 0 0  0   Injury with Fall? 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   Elevated PSA sees uro Dr Apolinar Junes - receives yearly PSA screen through urology.   Preventative: COLONOSCOPY 09/2021 - ?TA, lipoma, int hem, diverticulosis, rpt 7 yrs [name])  Prostate cancer screening - elevated PSA followed by Dr Apolinar Junes sees yearly. H/o low risk velocity, biopsy normal 12/2014. Prostate MRI reassuring 10/2016. + fmhx father with prostate cancer dx age 42s, controlled with seeds. Lung cancer screening - not eligible Flu shot yearly  COVID vaccine - Pfizer 10/2019, 11/2019, booster 06/2020, 03/2021, bivalent 07/2021, 06/2022, 06/2023.  Prevnar-13 09/2015, pneumovax 2018  Td 2009  RSV 09/2022 zostavax - 07/2014  Shingrix - discussed, declines  Advanced directive: brings living will, scanned 07/2023. Does not want prolonged life support if terminal condition.  Wife [name] followed by son [name] are HCPOA.  Seat belt use discussed Sunscreen use discussed. No changing moles on skin Nonsmoker  Alcohol - none Dentist q6 mo  Eye exam yearly (05/2023) Bowel - no constipation  Bladder - no incontinence   Married; lives with wife [name]). 1 grown son Retired [employer]  Enjoys beekeeping  Activity: walks 1 mile around the house Diet: good water, fruits/vegetables daily      Relevant past medical, surgical, family and social history reviewed and updated as indicated. Interim medical history since our last visit reviewed. Allergies and medications reviewed and updated. Outpatient Medications Prior to Visit  Medication Sig Dispense Refill   Cholecalciferol (VITAMIN D) 2000 units CAPS Take 1 capsule (2,000 Units total) by mouth daily. 30 capsule    Multiple Vitamin (MULTIVITAMIN) capsule Take 1 capsule by mouth daily.     Omega-3 Fatty Acids (FISH OIL PO) Take 1 capsule by mouth daily.     amLODipine (NORVASC) 10 MG tablet TAKE 1 TABLET BY MOUTH EVERY DAY 90 tablet 2   No facility-administered medications prior to visit.     Per HPI unless specifically indicated in ROS section below Review of Systems  Constitutional:  Negative for activity change, appetite change, chills, fatigue, fever and unexpected weight change.  HENT:  Negative for hearing loss.   Eyes:  Negative for visual disturbance.  Respiratory:  Negative for cough, chest tightness, shortness of breath and wheezing.   Cardiovascular:  Negative for chest pain, palpitations and leg swelling.  Gastrointestinal:  Negative for abdominal distention, abdominal pain, blood in stool, constipation, diarrhea, nausea and vomiting.  Genitourinary:  Negative for difficulty urinating and hematuria.  Musculoskeletal:  Negative for arthralgias, myalgias and neck pain.  Skin:  Negative for rash.  Neurological:  Negative for dizziness, seizures, syncope and headaches.   Hematological:  Negative for adenopathy. Does not bruise/bleed easily.  Psychiatric/Behavioral:  Negative for dysphoric mood. The patient is not nervous/anxious.     Objective:  BP 132/80   Pulse 62   Temp 97.7 F (36.5 C) (Oral)   Ht 5' 11.75" (1.822 m)   Wt 169 lb 2 oz (76.7 kg)   SpO2 99%   BMI 23.10 kg/m   Wt Readings from Last 3 Encounters:  08/04/23 169 lb 2 oz (76.7 kg)  05/15/23 165 lb (74.8 kg)  10/08/22 170 lb (77.1 kg)      Physical Exam Vitals and nursing note reviewed.  Constitutional:      General: He is not in acute distress.    Appearance: Normal appearance. He is well-developed. He is not ill-appearing.  HENT:     Head: Normocephalic and atraumatic.     Right Ear: Hearing, tympanic membrane, ear canal and external ear normal.     Left Ear: Hearing, tympanic membrane, ear canal and external ear normal.     Mouth/Throat:     Mouth: Mucous membranes are moist.     Pharynx: Oropharynx is clear. No oropharyngeal exudate or posterior oropharyngeal erythema.  Eyes:     General: No scleral icterus.    Extraocular Movements: Extraocular movements intact.     Conjunctiva/sclera: Conjunctivae normal.     Pupils: Pupils are equal, round, and reactive to light.  Neck:     Thyroid: No thyroid mass or thyromegaly.     Vascular: No carotid bruit.  Cardiovascular:     Rate and Rhythm: Normal rate and regular rhythm.     Pulses: Normal pulses.          Radial pulses are 2+ on the right side and 2+ on the left side.     Heart sounds: Normal heart sounds. No murmur heard. Pulmonary:     Effort: Pulmonary effort is normal. No respiratory distress.     Breath sounds: Normal breath sounds. No wheezing, rhonchi or rales.  Abdominal:     General: Bowel sounds are normal. There is no distension.     Palpations: Abdomen is soft. There is no mass.     Tenderness: There is no abdominal tenderness. There is no guarding or rebound.     Hernia: No hernia is present.   Musculoskeletal:        General: Normal range of motion.     Cervical back: Normal range of motion and neck supple.     Right lower leg: No edema.     Left lower leg: No edema.  Lymphadenopathy:     Cervical: No cervical adenopathy.  Skin:    General: Skin is warm and dry.     Findings: No rash.  Neurological:     General: No focal deficit present.     Mental Status: He is alert and oriented to person, place, and time.  Psychiatric:        Mood and  Affect: Mood normal.        Behavior: Behavior normal.        Thought Content: Thought content normal.        Judgment: Judgment normal.       Results for orders placed or performed in visit on 07/28/23  Lipoprotein A (LPA)  Result Value Ref Range   Lipoprotein (a) 202 (H) <75 nmol/L  CBC with Differential/Platelet  Result Value Ref Range   WBC 5.9 4.0 - 10.5 K/uL   RBC 4.81 4.22 - 5.81 Mil/uL   Hemoglobin 15.1 13.0 - 17.0 g/dL   HCT 16.1 09.6 - 04.5 %   MCV 93.8 78.0 - 100.0 fl   MCHC 33.4 30.0 - 36.0 g/dL   RDW 40.9 81.1 - 91.4 %   Platelets 234.0 150.0 - 400.0 K/uL   Neutrophils Relative % 45.5 43.0 - 77.0 %   Lymphocytes Relative 35.4 12.0 - 46.0 %   Monocytes Relative 10.4 3.0 - 12.0 %   Eosinophils Relative 7.5 (H) 0.0 - 5.0 %   Basophils Relative 1.2 0.0 - 3.0 %   Neutro Abs 2.7 1.4 - 7.7 K/uL   Lymphs Abs 2.1 0.7 - 4.0 K/uL   Monocytes Absolute 0.6 0.1 - 1.0 K/uL   Eosinophils Absolute 0.4 0.0 - 0.7 K/uL   Basophils Absolute 0.1 0.0 - 0.1 K/uL  Parathyroid hormone, intact (no Ca)  Result Value Ref Range   PTH 37 16 - 77 pg/mL  Microalbumin / creatinine urine ratio  Result Value Ref Range   Microalb, Ur 1.1 0.0 - 1.9 mg/dL   Creatinine,U 78.2 mg/dL   Microalb Creat Ratio 1.6 0.0 - 30.0 mg/g  VITAMIN D 25 Hydroxy (Vit-D Deficiency, Fractures)  Result Value Ref Range   VITD 46.73 30.00 - 100.00 ng/mL  Phosphorus  Result Value Ref Range   Phosphorus 2.8 2.3 - 4.6 mg/dL  Comprehensive metabolic panel  Result  Value Ref Range   Sodium 143 135 - 145 mEq/L   Potassium 4.4 3.5 - 5.1 mEq/L   Chloride 106 96 - 112 mEq/L   CO2 31 19 - 32 mEq/L   Glucose, Bld 89 70 - 99 mg/dL   BUN 19 6 - 23 mg/dL   Creatinine, Ser 9.56 0.40 - 1.50 mg/dL   Total Bilirubin 0.5 0.2 - 1.2 mg/dL   Alkaline Phosphatase 76 39 - 117 U/L   AST 21 0 - 37 U/L   ALT 20 0 - 53 U/L   Total Protein 7.3 6.0 - 8.3 g/dL   Albumin 4.5 3.5 - 5.2 g/dL   GFR 21.30 (L) >86.57 mL/min   Calcium 9.6 8.4 - 10.5 mg/dL  Lipid panel  Result Value Ref Range   Cholesterol 230 (H) 0 - 200 mg/dL   Triglycerides 846.9 0.0 - 149.0 mg/dL   HDL 62.95 >28.41 mg/dL   VLDL 32.4 0.0 - 40.1 mg/dL   LDL Cholesterol 027 (H) 0 - 99 mg/dL   Total CHOL/HDL Ratio 4    NonHDL 171.93     Assessment & Plan:   Problem List Items Addressed This Visit     Health maintenance examination - Primary (Chronic)    Preventative protocols reviewed and updated unless pt declined. Discussed healthy diet and lifestyle.       Advanced directives, counseling/discussion (Chronic)    Advanced directive: brings living will, scanned 07/2023. Does not want prolonged life support if terminal condition. Wife [name] followed by son [name] are HCPOA.  HLD (hyperlipidemia)    Chronic off Rx med, continues fish oil daily. Reviewed elevated Lp(a) and associated increased cardiovascular risk. He is hesitant for statin.  Will order coronary calcium score to further inform statin decision. The 10-year ASCVD risk score (Arnett DK, et al., 2019) is: 26.4%   Values used to calculate the score:     Age: 49 years     Sex: Male     Is Non-Hispanic African American: No     Diabetic: No     Tobacco smoker: No     Systolic Blood Pressure: 132 mmHg     Is BP treated: Yes     HDL Cholesterol: 58.1 mg/dL     Total Cholesterol: 230 mg/dL       Relevant Medications   amLODipine (NORVASC) 10 MG tablet   Other Relevant Orders   CT CARDIAC SCORING (SELF PAY ONLY)    Elevated prostate specific antigen (PSA)    Sees urology ,appreciate their care.      Chronic kidney disease (CKD), stage III (moderate) (HCC)    Discussed with patient, reviewing importance of good hydration, good BP control, and avoiding nephrotoxic medications. Continue to monitor.      Essential hypertension    Chronic, stable on current regimen - continue.       Relevant Medications   amLODipine (NORVASC) 10 MG tablet   Vitamin D deficiency    Stable on 2000 international units  daily.       Eosinophilia    Mild, fluctuating depending on season, anticipate allergy related.       Bilateral sensorineural hearing loss     Meds ordered this encounter  Medications   amLODipine (NORVASC) 10 MG tablet    Sig: Take 1 tablet (10 mg total) by mouth daily.    Dispense:  90 tablet    Refill:  4    Orders Placed This Encounter  Procedures   CT CARDIAC SCORING (SELF PAY ONLY)    Standing Status:   Future    Standing Expiration Date:   08/03/2024    Order Specific Question:   Preferred imaging location?    Answer:   [facility]    Patient Instructions  We will order coronary CT with calcium score at [facility] are doing well today Return as needed or in 1 year for next physical   Follow up plan: Return in about 1 year (around 08/03/2024) for annual exam, prior fasting for blood work, medicare wellness visit.  Eustaquio Boyden, MD

## 2023-08-04 NOTE — Assessment & Plan Note (Signed)
Chronic, stable on current regimen - continue. 

## 2023-08-04 NOTE — Assessment & Plan Note (Signed)
Stable on 2000 international units  daily.

## 2023-08-04 NOTE — Assessment & Plan Note (Signed)
Discussed with patient, reviewing importance of good hydration, good BP control, and avoiding nephrotoxic medications. Continue to monitor.

## 2023-08-04 NOTE — Assessment & Plan Note (Addendum)
Chronic off Rx med, continues fish oil daily. Reviewed elevated Lp(a) and associated increased cardiovascular risk. He is hesitant for statin.  Will order coronary calcium score to further inform statin decision. The 10-year ASCVD risk score (Arnett DK, et al., 2019) is: 26.4%   Values used to calculate the score:     Age: 72 years     Sex: Male     Is Non-Hispanic African American: No     Diabetic: No     Tobacco smoker: No     Systolic Blood Pressure: 132 mmHg     Is BP treated: Yes     HDL Cholesterol: 58.1 mg/dL     Total Cholesterol: 230 mg/dL

## 2023-08-04 NOTE — Assessment & Plan Note (Signed)
Mild, fluctuating depending on season, anticipate allergy related.

## 2023-08-04 NOTE — Assessment & Plan Note (Signed)
Sees urology ,appreciate their care.

## 2023-08-13 ENCOUNTER — Ambulatory Visit
Admission: RE | Admit: 2023-08-13 | Discharge: 2023-08-13 | Disposition: A | Discharge status: Home / Self Care | Payer: PPO | Source: Ambulatory Visit | Attending: Family Medicine | Admitting: Family Medicine

## 2023-08-13 DIAGNOSIS — E7841 Elevated Lipoprotein(a): Secondary | ICD-10-CM | POA: Insufficient documentation

## 2023-08-18 ENCOUNTER — Encounter: Payer: Self-pay | Admitting: Family Medicine

## 2023-10-08 ENCOUNTER — Other Ambulatory Visit: Payer: Self-pay

## 2023-10-08 DIAGNOSIS — R972 Elevated prostate specific antigen [PSA]: Secondary | ICD-10-CM

## 2023-10-08 NOTE — Addendum Note (Signed)
Addended byRanda Lynn on: 10/08/2023 05:17 PM   Modules accepted: Orders

## 2023-10-09 ENCOUNTER — Other Ambulatory Visit: Payer: PPO

## 2023-10-09 DIAGNOSIS — R972 Elevated prostate specific antigen [PSA]: Secondary | ICD-10-CM

## 2023-10-10 LAB — PSA: Prostate Specific Ag, Serum: 8.1 ng/mL — ABNORMAL HIGH (ref 0.0–4.0)

## 2023-10-14 ENCOUNTER — Ambulatory Visit: Payer: PPO | Admitting: Urology

## 2023-10-14 VITALS — BP 138/74 | HR 64 | Wt 170.0 lb

## 2023-10-14 DIAGNOSIS — R972 Elevated prostate specific antigen [PSA]: Secondary | ICD-10-CM | POA: Diagnosis not present

## 2023-10-14 NOTE — Progress Notes (Signed)
I,Amy L Pierron,acting as a scribe for Vanna Scotland, MD.,have documented all relevant documentation on the behalf of Vanna Scotland, MD,as directed by  Vanna Scotland, MD while in the presence of Vanna Scotland, MD.  10/14/2023 9:11 AM   William Shelton Nov 24, 1950 [id number]  Referring provider: Eustaquio Boyden, MD [address] [place],  [place] [address]  Chief Complaint  Patient presents with   Elevated PSA    HPI: 73 year-old male with a personal history of elevated PSA presents today for annual follow-up.  He had a prostate MRI in 2018 that showed it was fairly unremarkable.   He feels his urinary stream is good and is fully emptying. He has nocturia x2.   PSA trend:  8.1 on 10/09/2023 6.4 on 10/03/22 4.9 on 09/27/21 4.75 on 04/27/21 5.1 on 09/04/20 6.1 on 03/01/20 6.1 on 08/30/19 5.5 on 03/11/19 6.6 on 08/21/2018 4.7  On 08/20/2017 6.0 on 02/17/17   6.6 on 10/11/16  --> MRI 10/2016 negative, no high grade lesions 6.7 on 03/25/16 4.95 on 10/06/15 5.8 on 10/16 4.20 October 2014  --> negative prostate biopsy (unable to pull up records, TRUS volume unknown) 4.22 July 2014 2.04 2014 4.25 2013  PMH: Past Medical History:  Diagnosis Date   Allergic rhinitis, cause unspecified    Arthritis    BPH (benign prostatic hypertrophy)    BPV (benign positional vertigo) 09/28/2015   Chronic kidney disease (CKD), stage III (moderate) (HCC) 10/22/2011   Clotting disorder (HCC) 09/2019   left leg DVT   ED (erectile dysfunction)    Elevated prostate specific antigen (PSA) 2016   eval by Dr Apolinar Junes - s/p benign biopsy    Essential hypertension 08/04/2014   Hematuria, microscopic    HLD (hyperlipidemia)    Hydrocele, left    IBS (irritable bowel syndrome)    Internal hemorrhoid     Surgical History: Past Surgical History:  Procedure Laterality Date   COLONOSCOPY  07/12/2008   Internal hemorrhoids---Dr. [name]   COLONOSCOPY  09/2021   ?TA, lipoma, int hem,  diverticulosis, rpt 7 yrs ([facility])   INGUINAL HERNIA REPAIR  07/21/2002   Left---Dr. Lemar Livings    Home Medications:  Allergies as of 10/14/2023   No Known Allergies      Medication List        Accurate as of October 14, 2023  9:11 AM. If you have any questions, ask your nurse or doctor.          amLODipine 10 MG tablet Commonly known as: NORVASC Take 1 tablet (10 mg total) by mouth daily.   FISH OIL PO Take 1 capsule by mouth daily.   multivitamin capsule Take 1 capsule by mouth daily.   Vitamin D 50 MCG (2000 UT) Caps Take 1 capsule (2,000 Units total) by mouth daily.        Family History: Family History  Problem Relation Age of Onset   Hypertension Father    Prostate cancer Father 20       Radiation seeds   Macular degeneration Father    Hyperlipidemia Brother    Other Brother        BPH   Diabetes Neg Hx    Breast cancer Neg Hx    Depression Neg Hx    Alcohol abuse Neg Hx    Stroke Neg Hx    CAD Neg Hx    Rectal cancer Neg Hx    Stomach cancer Neg Hx    Colon polyps Neg Hx  Social History:  reports that he has never smoked. He has never used smokeless tobacco. He reports that he does not drink alcohol and does not use drugs.   Physical Exam: BP 138/74   Pulse 64   Wt 170 lb (77.1 kg)   BMI 23.22 kg/m   Constitutional:  Alert and oriented, No acute distress. HEENT: [place] AT, moist mucus membranes.  Trachea midline, no masses. GU: BPH nodules on prostate.  Enlarged. Neurologic: Grossly intact, no focal deficits, moving all 4 extremities. Psychiatric: Normal mood and affect.   Assessment & Plan:    1. Elevated PSA  - Continues to rise. Recommend having another prostate MRI at this point in time. Depending on results may check PSA again in 6 months, or proceed to having a fusion biopsy. He is in agreement with this plan. Will call him with the results of the MRI.   Return in about 4 weeks (around 11/11/2023) for prostate mri.  I have  reviewed the above documentation for accuracy and completeness, and I agree with the above.   Vanna Scotland, MD    [facility] [address] [place], [place] [address] [phone]

## 2023-10-29 ENCOUNTER — Ambulatory Visit
Admission: RE | Admit: 2023-10-29 | Discharge: 2023-10-29 | Disposition: A | Discharge status: Home / Self Care | Payer: PPO | Source: Ambulatory Visit | Attending: Urology | Admitting: Urology

## 2023-10-29 DIAGNOSIS — K5730 Diverticulosis of large intestine without perforation or abscess without bleeding: Secondary | ICD-10-CM | POA: Diagnosis not present

## 2023-10-29 DIAGNOSIS — N400 Enlarged prostate without lower urinary tract symptoms: Secondary | ICD-10-CM | POA: Diagnosis not present

## 2023-10-29 DIAGNOSIS — R972 Elevated prostate specific antigen [PSA]: Secondary | ICD-10-CM | POA: Diagnosis not present

## 2023-10-29 MED ORDER — GADOBUTROL 1 MMOL/ML IV SOLN
8.0000 mL | Freq: Once | INTRAVENOUS | Status: AC | PRN
  Administered 2023-10-29: 8 mL via INTRAVENOUS

## 2023-10-31 ENCOUNTER — Encounter: Payer: Self-pay | Admitting: Urology

## 2023-10-31 DIAGNOSIS — R972 Elevated prostate specific antigen [PSA]: Secondary | ICD-10-CM

## 2023-12-08 ENCOUNTER — Encounter: Payer: Self-pay | Admitting: Family Medicine

## 2023-12-30 DIAGNOSIS — H903 Sensorineural hearing loss, bilateral: Secondary | ICD-10-CM | POA: Diagnosis not present

## 2024-02-02 ENCOUNTER — Ambulatory Visit: Payer: Self-pay | Admitting: *Deleted

## 2024-02-02 NOTE — Telephone Encounter (Signed)
  Chief Complaint: patient removed tick 26 days ago- still has redness and swelling at site Symptoms: redness, swelling at tick sire- left shoulder blade Frequency: 26 days Pertinent Negatives: Patient denies rash,headache  Disposition: [] ED /[] Urgent Care (no appt availability in office) / [x] Appointment(In office/virtual)/ []  [place] [facility]/ [] Home Care/ [] Refused Recommended Disposition /[] [facility] [facility]/ []  Follow-up with PCP   Copied from CRM [id number]. Topic: Clinical - Red Word Triage >> Feb 02, 2024  1:52 PM Adonis Hoot wrote: Red Word that prompted transfer to Nurse Triage: tick bite 27 days ago on shoulder,area still slightly swollen,itches Reason for Disposition  [1] Red or very tender (to touch) area AND [2] started over 24 hours after the bite  Answer Assessment - Initial Assessment Questions 1. ATTACHED:  "Is the tick still on the skin?"  (e.g., yes, no, unsure)     26 day ago- removed 2. ONSET - TICK STILL ATTACHED:  "How long do you think the tick has been on your skin?" (e.g., hours, days, unsure)  Note:  Is there a recent activity (camping, hiking) where the caller may have been exposed?     no 3. ONSET - TICK NOT STILL ATTACHED: "If the tick has been removed, how long do you think the tick was attached before you removed it?" (e.g., 5 hours, 2 days). "When was this?"     na 4. LOCATION: "Where is the tick bite located?" (e.g., arm, leg)     Shoulder blade- left 5. TYPE of TICK: "Is it a wood tick or a deer tick?" (e.g., deer tick, wood tick; unsure)     Tick was about 3mm no markings, tan or brown. 6. SIZE of TICK: "How big is the tick?" (e.g., size of poppy seed, apple seed, watermelon seed; unsure) Note: Deer ticks can be the size of a poppy seed (nymph) or an apple seed (adult).       3mm 7. ENGORGED: "Did the tick look flat or engorged (full, swollen)?" (e.g., flat, engorged; unsure)     no 8. OTHER SYMPTOMS: "Do you have any other symptoms?" (e.g.,  fever, rash, redness at bite area, red ring around bite)     Redness at sire, swelling  Protocols used: Tick Bite-A-AH

## 2024-02-03 ENCOUNTER — Ambulatory Visit (INDEPENDENT_AMBULATORY_CARE_PROVIDER_SITE_OTHER): Admitting: Internal Medicine

## 2024-02-03 ENCOUNTER — Encounter: Payer: Self-pay | Admitting: Internal Medicine

## 2024-02-03 VITALS — BP 130/86 | HR 56 | Temp 97.7°F | Ht 71.75 in | Wt 170.0 lb

## 2024-02-03 DIAGNOSIS — R21 Rash and other nonspecific skin eruption: Secondary | ICD-10-CM

## 2024-02-03 MED ORDER — TRIAMCINOLONE ACETONIDE 0.1 % EX CREA: 1.0000 | TOPICAL_CREAM | Freq: Two times a day (BID) | CUTANEOUS | 1 refills | Status: AC | PRN

## 2024-02-03 NOTE — Progress Notes (Signed)
 Subjective:    Patient ID: William Shelton, male    DOB: 1951-07-06, 73 y.o.   MRN: [id number]  HPI Here due to not feeling right since a tick bite in March  Tick over left shoulder blade 50 days ago Wife removed with "tick key" Not sure how long it was on--?2 days Not engorged  No fever Did have redness around the area that persists---some hardness  More tired--but not sure it is related Some upset stomach  No joint swelling No dizziness/syncope  Current Outpatient Medications on File Prior to Visit  Medication Sig Dispense Refill   amLODipine  (NORVASC ) 10 MG tablet Take 1 tablet (10 mg total) by mouth daily. 90 tablet 4   Cholecalciferol (VITAMIN D ) 2000 units CAPS Take 1 capsule (2,000 Units total) by mouth daily. 30 capsule    Multiple Vitamin (MULTIVITAMIN) capsule Take 1 capsule by mouth daily.     Omega-3 Fatty Acids (FISH OIL PO) Take 1 capsule by mouth daily.     No current facility-administered medications on file prior to visit.    No Known Allergies  Past Medical History:  Diagnosis Date   Allergic rhinitis, cause unspecified    Arthritis    BPH (benign prostatic hypertrophy)    BPV (benign positional vertigo) 09/28/2015   Chronic kidney disease (CKD), stage III (moderate) (HCC) 10/22/2011   Clotting disorder (HCC) 09/2019   left leg DVT   ED (erectile dysfunction)    Elevated prostate specific antigen (PSA) 2016   eval by Dr Ace Holder - s/p benign biopsy    Essential hypertension 08/04/2014   Hematuria, microscopic    HLD (hyperlipidemia)    Hydrocele, left    IBS (irritable bowel syndrome)    Internal hemorrhoid     Past Surgical History:  Procedure Laterality Date   COLONOSCOPY  07/12/2008   Internal hemorrhoids---Dr. Sandrea Cruel   COLONOSCOPY  09/2021   ?TA, lipoma, int hem, diverticulosis, rpt 7 yrs ([facility])   INGUINAL HERNIA REPAIR  07/21/2002   Left---Dr. Marquita Situ    Family History  Problem Relation Age of Onset   Hypertension Father     Prostate cancer Father 67       Radiation seeds   Macular degeneration Father    Hyperlipidemia Brother    Other Brother        BPH   Diabetes Neg Hx    Breast cancer Neg Hx    Depression Neg Hx    Alcohol abuse Neg Hx    Stroke Neg Hx    CAD Neg Hx    Rectal cancer Neg Hx    Stomach cancer Neg Hx    Colon polyps Neg Hx     Social History   Socioeconomic History   Marital status: Married    Spouse name: Not on file   Number of children: 1   Years of education: Not on file   Highest education level: Associate degree: occupational, Scientist, product/process development, or vocational program  Occupational History   Occupation: Retired  Tobacco Use   Smoking status: Never   Smokeless tobacco: Never  Vaping Use   Vaping status: Never Used  Substance and Sexual Activity   Alcohol use: No   Drug use: No   Sexual activity: Not on file  Other Topics Concern   Not on file  Social History Narrative   Lives in [place]. Married; lives with wife; 1 son Retired Scientist, clinical (histocompatibility and immunogenetics) ; Activity: walks daily Diet: good water, fruits/vegetables daily.  No smoking; ocassional beer.    Social Drivers of Corporate investment banker Strain: Low Risk  (02/02/2024)   Overall Financial Resource Strain (CARDIA)    Difficulty of Paying Living Expenses: Not hard at all  Food Insecurity: No Food Insecurity (02/02/2024)   Hunger Vital Sign    Worried About Running Out of Food in the Last Year: Never true    Ran Out of Food in the Last Year: Never true  Transportation Needs: No Transportation Needs (02/02/2024)   PRAPARE - Administrator, Civil Service (Medical): No    Lack of Transportation (Non-Medical): No  Physical Activity: Insufficiently Active (02/02/2024)   Exercise Vital Sign    Days of Exercise per Week: 5 days    Minutes of Exercise per Session: 10 min  Stress: No Stress Concern Present (02/02/2024)   [employer] of Occupational Health - Occupational Stress  Questionnaire    Feeling of Stress : Only a little  Social Connections: Moderately Isolated (02/02/2024)   Social Connection and Isolation Panel [NHANES]    Frequency of Communication with Friends and Family: Never    Frequency of Social Gatherings with Friends and Family: Never    Attends Religious Services: Never    Database administrator or Organizations: No    Attends Engineer, structural: More than 4 times per year    Marital Status: Married  Catering manager Violence: Not At Risk (05/15/2023)   Humiliation, Afraid, Rape, and Kick questionnaire    Fear of Current or Ex-Partner: No    Emotionally Abused: No    Physically Abused: No    Sexually Abused: No   Review of Systems Eating okay  Weight stable    Objective:   Physical Exam Constitutional:      Appearance: Normal appearance.  Cardiovascular:     Rate and Rhythm: Normal rate and regular rhythm.     Heart sounds: No murmur heard.    No gallop.  Pulmonary:     Effort: Pulmonary effort is normal.     Breath sounds: Normal breath sounds. No wheezing or rales.  Musculoskeletal:     Comments: No joint swelling  Skin:    Comments: ~ 2 x 3 cm scaly red area over left lower scapula. No induration, warmth or tenderness  Neurological:     Mental Status: He is alert.            Assessment & Plan:

## 2024-02-03 NOTE — Telephone Encounter (Signed)
 Noted.   Pt saw Dr Joelle Musca today at 7:30.

## 2024-02-03 NOTE — Assessment & Plan Note (Addendum)
 Overlying spot of tick bite 50 days ago Not infected/inflamed No signs of secondary Lyme No specific symptoms of concern--just vague fatigue. Will check labs if ongoing symptoms  The rash seems eczematous===neosporin no help----will try triamcinolone 

## 2024-04-23 ENCOUNTER — Other Ambulatory Visit: Payer: PPO

## 2024-04-27 ENCOUNTER — Ambulatory Visit: Payer: PPO | Admitting: Urology

## 2024-05-31 DIAGNOSIS — H5203 Hypermetropia, bilateral: Secondary | ICD-10-CM | POA: Diagnosis not present

## 2024-05-31 DIAGNOSIS — H2513 Age-related nuclear cataract, bilateral: Secondary | ICD-10-CM | POA: Diagnosis not present

## 2024-05-31 DIAGNOSIS — D3131 Benign neoplasm of right choroid: Secondary | ICD-10-CM | POA: Diagnosis not present

## 2024-05-31 DIAGNOSIS — D3132 Benign neoplasm of left choroid: Secondary | ICD-10-CM | POA: Diagnosis not present

## 2024-05-31 DIAGNOSIS — H524 Presbyopia: Secondary | ICD-10-CM | POA: Diagnosis not present

## 2024-06-27 ENCOUNTER — Encounter: Payer: Self-pay | Admitting: Family Medicine

## 2024-07-05 ENCOUNTER — Encounter: Payer: Self-pay | Admitting: Family Medicine

## 2024-07-13 NOTE — Telephone Encounter (Signed)
 Noted

## 2024-07-22 ENCOUNTER — Ambulatory Visit

## 2024-07-22 VITALS — Ht 71.75 in | Wt 170.0 lb

## 2024-07-22 DIAGNOSIS — Z0000 Encounter for general adult medical examination without abnormal findings: Secondary | ICD-10-CM | POA: Diagnosis not present

## 2024-07-22 NOTE — Patient Instructions (Signed)
 William Shelton,  Thank you for taking the time for your Medicare Wellness Visit. I appreciate your continued commitment to your health goals. Please review the care plan we discussed, and feel free to reach out if I can assist you further.  Please note that Annual Wellness Visits do not include a physical exam. Some assessments may be limited, especially if the visit was conducted virtually. If needed, we may recommend an in-person follow-up with your provider.  Ongoing Care Seeing your primary care provider every 3 to 6 months helps us  monitor your health and provide consistent, personalized care.   Referrals If a referral was made during today's visit and you haven't received any updates within two weeks, please contact the referred provider directly to check on the status.  Recommended Screenings:  Health Maintenance  Topic Date Due   Stool Blood Test  09/24/2022   Medicare Annual Wellness Visit  05/14/2024   DTaP/Tdap/Td vaccine (2 - Tdap) 09/15/2024*   Zoster (Shingles) Vaccine (1 of 2) 09/15/2024*   COVID-19 Vaccine (9 - 2025-26 season) 12/26/2024   Colon Cancer Screening  09/24/2028   Pneumococcal Vaccine for age over 65  Completed   Flu Shot  Completed   Hepatitis C Screening  Completed   Meningitis B Vaccine  Aged Out  *Topic was postponed. The date shown is not the original due date.       07/19/2024    8:33 AM  Advanced Directives  Does Patient Have a Medical Advance Directive? Yes  Type of Advance Directive Healthcare Power of Attorney  Copy of Healthcare Power of Attorney in Chart? Yes - validated most recent copy scanned in chart (See row information)    Vision: Annual vision screenings are recommended for early detection of glaucoma, cataracts, and diabetic retinopathy. These exams can also reveal signs of chronic conditions such as diabetes and high blood pressure.  Dental: Annual dental screenings help detect early signs of oral cancer, gum disease, and other  conditions linked to overall health, including heart disease and diabetes.

## 2024-07-22 NOTE — Progress Notes (Signed)
 Please attest and cosign this visit due to patients primary care provider not being in the office at the time the visit was completed.    Subjective:   William Shelton is a 73 y.o. male who presents for a Medicare Annual Wellness Visit.  I connected with  Eula Mazzola Ravan on 07/22/24 by a audio enabled telemedicine application and verified that I am speaking with the correct person using two identifiers.  Patient Location: Home  Provider Location: Office/Clinic  I discussed the limitations of evaluation and management by telemedicine. The patient expressed understanding and agreed to proceed.  Vital Signs: Because this visit was a virtual/telehealth visit, some criteria may be missing or patient reported. Any vitals not documented were not able to be obtained and vitals that have been documented are patient reported.   Allergies (verified) Patient has no known allergies.   History: Past Medical History:  Diagnosis Date   Allergic rhinitis, cause unspecified    Arthritis    BPH (benign prostatic hypertrophy)    BPV (benign positional vertigo) 09/28/2015   Chronic kidney disease (CKD), stage III (moderate) (HCC) 10/22/2011   Clotting disorder 09/2019   left leg DVT   ED (erectile dysfunction)    Elevated prostate specific antigen (PSA) 2016   eval by Dr Penne - s/p benign biopsy    Essential hypertension 08/04/2014   Hematuria, microscopic    HLD (hyperlipidemia)    Hydrocele, left    IBS (irritable bowel syndrome)    Internal hemorrhoid    Past Surgical History:  Procedure Laterality Date   COLONOSCOPY  07/12/2008   Internal hemorrhoids---Dr. Aneita   COLONOSCOPY  09/2021   ?TA, lipoma, int hem, diverticulosis, rpt 7 yrs ([facility])   INGUINAL HERNIA REPAIR  07/21/2002   Left---Dr. Dessa   Family History  Problem Relation Age of Onset   Hypertension Father    Prostate cancer Father 54       Radiation seeds   Macular degeneration Father    Hyperlipidemia  Brother    Other Brother        BPH   Diabetes Neg Hx    Breast cancer Neg Hx    Depression Neg Hx    Alcohol abuse Neg Hx    Stroke Neg Hx    CAD Neg Hx    Rectal cancer Neg Hx    Stomach cancer Neg Hx    Colon polyps Neg Hx    Social History   Occupational History   Occupation: Retired  Tobacco Use   Smoking status: Never   Smokeless tobacco: Never  Vaping Use   Vaping status: Never Used  Substance and Sexual Activity   Alcohol use: No   Drug use: No   Sexual activity: Not on file   Tobacco Counseling Counseling given: Not Answered  SDOH Screenings   Food Insecurity: No Food Insecurity (07/19/2024)  Housing: Low Risk  (07/19/2024)  Transportation Needs: No Transportation Needs (07/19/2024)  Utilities: Not At Risk (07/22/2024)  Alcohol Screen: Low Risk  (05/15/2023)  Depression (PHQ2-9): Low Risk  (07/22/2024)  Financial Resource Strain: Low Risk  (07/19/2024)  Physical Activity: Insufficiently Active (07/19/2024)  Social Connections: Socially Isolated (07/19/2024)  Stress: No Stress Concern Present (07/19/2024)  Tobacco Use: Low Risk  (07/22/2024)  Health Literacy: Adequate Health Literacy (07/22/2024)   Depression Screen    07/22/2024    1:16 PM 02/03/2024    7:33 AM 05/15/2023   10:16 AM 05/14/2022    9:29 AM 05/04/2021  10:38 AM 03/14/2020    8:22 AM 10/01/2019    8:15 AM  PHQ 2/9 Scores  PHQ - 2 Score 0 0 0 0 0 0 0  PHQ- 9 Score    0   0       Data saved with a previous flowsheet row definition     Goals Addressed             This Visit's Progress    Stay healthy and stay out of the hospital and keep cholesterol and PSA to stay down         Visit info / Clinical Intake: Medicare Wellness Visit Type:: Subsequent Annual Wellness Visit Medicare Wellness Visit Mode:: Telephone If telephone:: video declined If telephone or video:: unable to obtan vitals due to lack of equipment Interpreter Needed?: No Pre-visit prep was completed: yes AWV questionnaire  completed by patient prior to visit?: yes Date:: 07/19/24 Living arrangements:: lives with spouse/significant other Patient's Overall Health Status Rating: good Typical amount of pain: none Does pain affect daily life?: no Are you currently prescribed opioids?: no  Dietary Habits and Nutritional Risks How many meals a day?: 3 Eats fruit and vegetables daily?: yes Most meals are obtained by: preparing own meals (bkfst at home, lunch our and suppers at home) Diabetic:: no  Functional Status Activities of Daily Living (to include ambulation/medication): (Patient-Rptd) Independent Ambulation: (Patient-Rptd) Independent Medication Administration: Independent Home Management: (Patient-Rptd) Independent Manage your own finances?: yes Primary transportation is: driving Concerns about vision?: no *vision screening is required for WTM* Concerns about hearing?: (!) yes Uses hearing aids?: (!) yes Hear whispered voice?: (!) no *in-person visit only*  Fall Screening Falls in the past year?: (Patient-Rptd) 0 Number of falls in past year: 0 Was there an injury with Fall?: 0 Fall Risk Category Calculator: 0 Patient Fall Risk Level: Low Fall Risk  Fall Risk Patient at Risk for Falls Due to: No Fall Risks Fall risk Follow up: Education provided; Falls prevention discussed  Home and Transportation Safety: All rugs have non-skid backing?: yes All stairs or steps have railings?: (!) no Grab bars in the bathtub or shower?: (!) no Have non-skid surface in bathtub or shower?: yes Good home lighting?: yes Regular seat belt use?: yes Hospital stays in the last year:: no  Cognitive Assessment Difficulty concentrating, remembering, or making decisions? : no Will 6CIT or Mini Cog be Completed: yes What year is it?: 0 points What month is it?: 0 points Give patient an address phrase to remember (5 components): [address] [place]  About what time is it?: 0 points Count backwards  from 20 to 1: 0 points Repeat the address phrase from earlier: 0 points  Advance Directives (For Healthcare) Does Patient Have a Medical Advance Directive?: Yes Type of Advance Directive: Healthcare Power of [place]; Living will Copy of Healthcare Power of Attorney in Chart?: Yes - validated most recent copy scanned in chart (See row information) Copy of Living Will in Chart?: Yes - validated most recent copy scanned in chart (See row information)  Reviewed/Updated  Reviewed/Updated: All        Objective:    Today's Vitals   07/22/24 1304  Weight: 170 lb (77.1 kg)  Height: 5' 11.75 (1.822 m)   Body mass index is 23.22 kg/m.  Current Medications (verified) Outpatient Encounter Medications as of 07/22/2024  Medication Sig   amLODipine  (NORVASC ) 10 MG tablet Take 1 tablet (10 mg total) by mouth daily.   Cholecalciferol (VITAMIN D ) 2000  units CAPS Take 1 capsule (2,000 Units total) by mouth daily.   Multiple Vitamin (MULTIVITAMIN) capsule Take 1 capsule by mouth daily.   Omega-3 Fatty Acids (FISH OIL PO) Take 1 capsule by mouth daily.   triamcinolone  cream (KENALOG ) 0.1 % Apply 1 Application topically 2 (two) times daily as needed.   No facility-administered encounter medications on file as of 07/22/2024.   Hearing/Vision screen Vision Screening - Comments:: UTD visits w/Dr Portia Immunizations and Health Maintenance Health Maintenance  Topic Date Due   COLON CANCER SCREENING ANNUAL FOBT  09/24/2022   Medicare Annual Wellness (AWV)  05/14/2024   DTaP/Tdap/Td (2 - Tdap) 09/15/2024 (Originally 05/30/2018)   Zoster Vaccines- Shingrix (1 of 2) 09/15/2024 (Originally 03/22/2000)   COVID-19 Vaccine (9 - 2025-26 season) 12/26/2024   Colonoscopy  09/24/2028   Pneumococcal Vaccine: 50+ Years  Completed   Influenza Vaccine  Completed   Hepatitis C Screening  Completed   Meningococcal B Vaccine  Aged Out        Assessment/Plan:  This is a routine wellness examination for  William Shelton.  Patient Care Team: Rilla Baller, MD as PCP - General (Family Medicine)  I have personally reviewed and noted the following in the patient's chart:   Medical and social history Use of alcohol, tobacco or illicit drugs  Current medications and supplements including opioid prescriptions. Functional ability and status Nutritional status Physical activity Advanced directives List of other physicians Hospitalizations, surgeries, and ER visits in previous 12 months Vitals Screenings to include cognitive, depression, and falls Referrals and appointments  No orders of the defined types were placed in this encounter.  In addition, I have reviewed and discussed with patient certain preventive protocols, quality metrics, and best practice recommendations. A written personalized care plan for preventive services as well as general preventive health recommendations were provided to patient.   Erminio LITTIE Saris, LPN   88/11/7972   No follow-ups on file.  After Visit Summary: (MyChart) Due to this being a telephonic visit, the after visit summary with patients personalized plan was offered to patient via MyChart   Nurse Notes: Pt has no concerns or questions. AWV made for one year

## 2024-07-25 ENCOUNTER — Other Ambulatory Visit: Payer: Self-pay | Admitting: Family Medicine

## 2024-07-25 DIAGNOSIS — R972 Elevated prostate specific antigen [PSA]: Secondary | ICD-10-CM

## 2024-07-25 DIAGNOSIS — I10 Essential (primary) hypertension: Secondary | ICD-10-CM

## 2024-07-25 DIAGNOSIS — N1831 Chronic kidney disease, stage 3a: Secondary | ICD-10-CM

## 2024-07-25 DIAGNOSIS — E559 Vitamin D deficiency, unspecified: Secondary | ICD-10-CM

## 2024-07-25 DIAGNOSIS — E7800 Pure hypercholesterolemia, unspecified: Secondary | ICD-10-CM

## 2024-07-27 NOTE — Progress Notes (Signed)
 The patient was the participant in this visit

## 2024-07-29 ENCOUNTER — Other Ambulatory Visit (INDEPENDENT_AMBULATORY_CARE_PROVIDER_SITE_OTHER)

## 2024-07-29 DIAGNOSIS — I10 Essential (primary) hypertension: Secondary | ICD-10-CM

## 2024-07-29 DIAGNOSIS — N1831 Chronic kidney disease, stage 3a: Secondary | ICD-10-CM | POA: Diagnosis not present

## 2024-07-29 DIAGNOSIS — E559 Vitamin D deficiency, unspecified: Secondary | ICD-10-CM

## 2024-07-29 DIAGNOSIS — R972 Elevated prostate specific antigen [PSA]: Secondary | ICD-10-CM | POA: Diagnosis not present

## 2024-07-29 DIAGNOSIS — E7800 Pure hypercholesterolemia, unspecified: Secondary | ICD-10-CM | POA: Diagnosis not present

## 2024-07-29 LAB — CBC WITH DIFFERENTIAL/PLATELET
Basophils Absolute: 0 K/uL (ref 0.0–0.1)
Basophils Relative: 0.8 % (ref 0.0–3.0)
Eosinophils Absolute: 0.6 K/uL (ref 0.0–0.7)
Eosinophils Relative: 9.5 % — ABNORMAL HIGH (ref 0.0–5.0)
HCT: 43.1 % (ref 39.0–52.0)
Hemoglobin: 14.5 g/dL (ref 13.0–17.0)
Lymphocytes Relative: 35.5 % (ref 12.0–46.0)
Lymphs Abs: 2.1 K/uL (ref 0.7–4.0)
MCHC: 33.7 g/dL (ref 30.0–36.0)
MCV: 90.1 fl (ref 78.0–100.0)
Monocytes Absolute: 0.7 K/uL (ref 0.1–1.0)
Monocytes Relative: 12.4 % — ABNORMAL HIGH (ref 3.0–12.0)
Neutro Abs: 2.5 K/uL (ref 1.4–7.7)
Neutrophils Relative %: 41.8 % — ABNORMAL LOW (ref 43.0–77.0)
Platelets: 223 K/uL (ref 150.0–400.0)
RBC: 4.79 Mil/uL (ref 4.22–5.81)
RDW: 14 % (ref 11.5–15.5)
WBC: 5.9 K/uL (ref 4.0–10.5)

## 2024-07-29 LAB — COMPREHENSIVE METABOLIC PANEL WITH GFR
ALT: 27 U/L (ref 0–53)
AST: 27 U/L (ref 0–37)
Albumin: 4.3 g/dL (ref 3.5–5.2)
Alkaline Phosphatase: 72 U/L (ref 39–117)
BUN: 28 mg/dL — ABNORMAL HIGH (ref 6–23)
CO2: 29 meq/L (ref 19–32)
Calcium: 9.3 mg/dL (ref 8.4–10.5)
Chloride: 104 meq/L (ref 96–112)
Creatinine, Ser: 1.24 mg/dL (ref 0.40–1.50)
GFR: 57.34 mL/min — ABNORMAL LOW (ref >60.00)
Glucose, Bld: 88 mg/dL (ref 70–99)
Potassium: 4.9 meq/L (ref 3.5–5.1)
Sodium: 141 meq/L (ref 135–145)
Total Bilirubin: 0.5 mg/dL (ref 0.2–1.2)
Total Protein: 6.8 g/dL (ref 6.0–8.3)

## 2024-07-29 LAB — LIPID PANEL
Cholesterol: 208 mg/dL — ABNORMAL HIGH (ref 0–200)
HDL: 51.6 mg/dL (ref >39.00)
LDL Cholesterol: 130 mg/dL — ABNORMAL HIGH (ref 0–99)
NonHDL: 156.17
Total CHOL/HDL Ratio: 4
Triglycerides: 131 mg/dL (ref 0.0–149.0)
VLDL: 26.2 mg/dL (ref 0.0–40.0)

## 2024-07-29 LAB — PHOSPHORUS: Phosphorus: 2.8 mg/dL (ref 2.3–4.6)

## 2024-07-29 LAB — PSA: PSA: 9.69 ng/mL — ABNORMAL HIGH (ref 0.10–4.00)

## 2024-07-29 LAB — MICROALBUMIN / CREATININE URINE RATIO
Creatinine,U: 68.2 mg/dL
Microalb Creat Ratio: 40.4 mg/g — ABNORMAL HIGH (ref 0.0–30.0)
Microalb, Ur: 2.8 mg/dL — ABNORMAL HIGH (ref 0.0–1.9)

## 2024-07-29 LAB — VITAMIN D 25 HYDROXY (VIT D DEFICIENCY, FRACTURES): VITD: 37.81 ng/mL (ref 30.00–100.00)

## 2024-07-30 LAB — PARATHYROID HORMONE, INTACT (NO CA): PTH: 18 pg/mL (ref 16–77)

## 2024-08-02 ENCOUNTER — Ambulatory Visit: Payer: Self-pay | Admitting: Family Medicine

## 2024-08-04 ENCOUNTER — Encounter: Payer: Self-pay | Admitting: Family Medicine

## 2024-08-04 ENCOUNTER — Ambulatory Visit (INDEPENDENT_AMBULATORY_CARE_PROVIDER_SITE_OTHER): Admitting: Family Medicine

## 2024-08-04 VITALS — BP 124/84 | HR 69 | Temp 98.2°F | Ht 71.5 in | Wt 170.2 lb

## 2024-08-04 DIAGNOSIS — E7800 Pure hypercholesterolemia, unspecified: Secondary | ICD-10-CM | POA: Diagnosis not present

## 2024-08-04 DIAGNOSIS — E559 Vitamin D deficiency, unspecified: Secondary | ICD-10-CM | POA: Diagnosis not present

## 2024-08-04 DIAGNOSIS — I10 Essential (primary) hypertension: Secondary | ICD-10-CM | POA: Diagnosis not present

## 2024-08-04 DIAGNOSIS — R972 Elevated prostate specific antigen [PSA]: Secondary | ICD-10-CM

## 2024-08-04 DIAGNOSIS — D721 Eosinophilia, unspecified: Secondary | ICD-10-CM | POA: Diagnosis not present

## 2024-08-04 DIAGNOSIS — N1831 Chronic kidney disease, stage 3a: Secondary | ICD-10-CM | POA: Diagnosis not present

## 2024-08-04 DIAGNOSIS — R0989 Other specified symptoms and signs involving the circulatory and respiratory systems: Secondary | ICD-10-CM

## 2024-08-04 DIAGNOSIS — Z7189 Other specified counseling: Secondary | ICD-10-CM

## 2024-08-04 DIAGNOSIS — Z0000 Encounter for general adult medical examination without abnormal findings: Secondary | ICD-10-CM | POA: Diagnosis not present

## 2024-08-04 MED ORDER — AMLODIPINE BESYLATE 10 MG PO TABS
10.0000 mg | ORAL_TABLET | Freq: Every day | ORAL | 3 refills | Status: AC
Start: 2024-08-04 — End: ?

## 2024-08-04 NOTE — Assessment & Plan Note (Addendum)
 Reviewing trend over the years, eosinophils seem to increase in the fall and decrease over spring/summer months He notes some seasonal allergies, worse in spring/summer.

## 2024-08-04 NOTE — Assessment & Plan Note (Signed)
 Preventative protocols reviewed and updated unless pt declined. Discussed healthy diet and lifestyle.

## 2024-08-04 NOTE — Patient Instructions (Addendum)
 I do recommend returning to see [facility] urologist [phone] [facility] or [place]) To monitor elevated PSA.  Good to see you today Return as needed or in 1 year for next physical/wellness visit

## 2024-08-04 NOTE — Progress Notes (Signed)
 Ph: [phone] Fax: [phone]   Patient ID: William Shelton, male    DOB: 04/26/1951, 73 y.o.   MRN: [id number]  This visit was conducted in person.  BP 124/84   Pulse 69   Temp 98.2 F (36.8 C) (Oral)   Ht 5' 11.5 (1.816 m)   Wt 170 lb 4 oz (77.2 kg)   SpO2 99%   BMI 23.41 kg/m    CC: CPE Subjective:   HPI: William Shelton is a 73 y.o. male presenting on 08/04/2024 for Annual Exam   Saw health advisor earlier this month for medicare wellness visit. Note reviewed.   No results found.  Flowsheet Row Office Visit from 08/04/2024 in [facility] [place]  PHQ-2 Total Score 0       08/04/2024    9:56 AM 07/19/2024    8:33 AM 02/03/2024    7:33 AM 05/15/2023   10:18 AM 05/14/2022    9:31 AM  Fall Risk   Falls in the past year? 0 0 0 0 0  Number falls in past yr: 0 0 0 0 0  Injury with Fall? 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   Recent febrile upper respiratory infection. COVID was negative. Predominant sinus congestion with cough. Overall feeling better.   Elevated PSA saw uro Dr Penne last 09/2023 - yearly PSA through urology. Last prostate MRI 09/2023 reassuring. Due for uro f/u - new referral placed.   Coronary calcium score of zero (07/2023)   Preventative: COLONOSCOPY 09/2021 - ?TA, lipoma, int hem, diverticulosis, rpt 7 yrs [facility])  Prostate cancer screening - elevated PSA followed by Dr Penne sees yearly. H/o low risk velocity, biopsy normal 12/2014. Prostate MRI reassuring 10/2016. + fmhx father with prostate cancer dx age 49s, controlled with seeds. Lung cancer screening - not  eligible Flu shot yearly  COVID vaccine - Pfizer 10/2019, 11/2019, booster 06/2020, 03/2021, bivalent 07/2021, 06/2022, 06/2023.  Prevnar-13 09/2015, pneumovax 2018, prevnar-20 offered, declined Td 2009  RSV 09/2022 zostavax - 07/2014  Shingrix - discussed, declines  Advanced directive: brings living will, scanned 07/2023. Does not want prolonged life support if terminal condition. Wife Hargis followed by son Lonni are HCPOA.  Seat belt use discussed Sunscreen use discussed. No changing moles on skin Nonsmoker  Alcohol - none Dentist q6 mo (04/2024) Eye exam yearly (05/2024) Bowel - no constipation  Bladder - no incontinence   Married; lives with wife Mikle). 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 Interim medical history since our last visit reviewed. Allergies and medications reviewed and updated. Outpatient Medications Prior to Visit  Medication Sig Dispense Refill   Cholecalciferol (VITAMIN D ) 2000 units CAPS Take 1 capsule (2,000 Units total) by mouth daily. 30 capsule    Multiple Vitamin (MULTIVITAMIN) capsule Take 1 capsule by mouth daily.     Omega-3 Fatty Acids (FISH OIL PO) Take 1 capsule by mouth daily.  triamcinolone  cream (KENALOG ) 0.1 % Apply 1 Application topically 2 (two) times daily as needed. 45 g 1   amLODipine  (NORVASC ) 10 MG tablet Take 1 tablet (10 mg total) by mouth daily. 90 tablet 4   No facility-administered medications prior to visit.     Per HPI unless specifically indicated in ROS section below Review of Systems  Constitutional:  Positive for fever. Negative for activity change, appetite change, chills, fatigue and unexpected weight change.  HENT:  Positive for congestion and sinus pressure. Negative for hearing loss.    Eyes:  Negative for visual disturbance.  Respiratory:  Positive for cough. Negative for chest tightness, shortness of breath and wheezing.   Cardiovascular:  Negative for chest pain, palpitations and leg swelling.  Gastrointestinal:  Negative for abdominal distention, abdominal pain, blood in stool, constipation, diarrhea, nausea and vomiting.  Genitourinary:  Negative for difficulty urinating and hematuria.  Musculoskeletal:  Positive for arthralgias. Negative for myalgias and neck pain.  Skin:  Negative for rash.  Neurological:  Positive for headaches. Negative for dizziness, seizures and syncope.  Hematological:  Negative for adenopathy. Does not bruise/bleed easily.  Psychiatric/Behavioral:  Negative for dysphoric mood. The patient is not nervous/anxious.     Objective:  BP 124/84   Pulse 69   Temp 98.2 F (36.8 C) (Oral)   Ht 5' 11.5 (1.816 m)   Wt 170 lb 4 oz (77.2 kg)   SpO2 99%   BMI 23.41 kg/m   Wt Readings from Last 3 Encounters:  08/04/24 170 lb 4 oz (77.2 kg)  07/22/24 170 lb (77.1 kg)  02/03/24 170 lb (77.1 kg)      Physical Exam Vitals and nursing note reviewed.  Constitutional:      General: He is not in acute distress.    Appearance: Normal appearance. He is well-developed. He is not ill-appearing.  HENT:     Head: Normocephalic and atraumatic.     Right Ear: Hearing, tympanic membrane, ear canal and external ear normal.     Left Ear: Hearing, tympanic membrane, ear canal and external ear normal.     Mouth/Throat:     Mouth: Mucous membranes are moist.     Pharynx: Oropharynx is clear. No oropharyngeal exudate or posterior oropharyngeal erythema.  Eyes:     General: No scleral icterus.    Extraocular Movements: Extraocular movements intact.     Conjunctiva/sclera: Conjunctivae normal.     Pupils: Pupils are equal, round, and reactive to light.  Neck:     Thyroid : No thyroid  mass or thyromegaly.     Vascular: No carotid bruit.  Cardiovascular:      Rate and Rhythm: Normal rate and regular rhythm.     Pulses: Normal pulses.          Radial pulses are 2+ on the right side and 2+ on the left side.     Heart sounds: Normal heart sounds. No murmur heard. Pulmonary:     Effort: Pulmonary effort is normal. No respiratory distress.     Breath sounds: Normal breath sounds. No wheezing, rhonchi or rales.  Abdominal:     General: Bowel sounds are normal. There is no distension.     Palpations: Abdomen is soft. There is no mass.     Tenderness: There is no abdominal tenderness. There is no guarding or rebound.     Hernia: No hernia is present.  Musculoskeletal:        General: Normal range of motion.     Cervical  back: Normal range of motion and neck supple.     Right lower leg: No edema.     Left lower leg: No edema.  Lymphadenopathy:     Cervical: No cervical adenopathy.  Skin:    General: Skin is warm and dry.     Findings: No rash.  Neurological:     General: No focal deficit present.     Mental Status: He is alert and oriented to person, place, and time.  Psychiatric:        Mood and Affect: Mood normal.        Behavior: Behavior normal.        Thought Content: Thought content normal.        Judgment: Judgment normal.       Results for orders placed or performed in visit on 07/29/24  PSA   Collection Time: 07/29/24  7:31 AM  Result Value Ref Range   PSA 9.69 (H) 0.10 - 4.00 ng/mL  CBC with Differential/Platelet   Collection Time: 07/29/24  7:31 AM  Result Value Ref Range   WBC 5.9 4.0 - 10.5 K/uL   RBC 4.79 4.22 - 5.81 Mil/uL   Hemoglobin 14.5 13.0 - 17.0 g/dL   HCT 56.8 60.9 - 47.9 %   MCV 90.1 78.0 - 100.0 fl   MCHC 33.7 30.0 - 36.0 g/dL   RDW 85.9 88.4 - 84.4 %   Platelets 223.0 150.0 - 400.0 K/uL   Neutrophils Relative % 41.8 (L) 43.0 - 77.0 %   Lymphocytes Relative 35.5 12.0 - 46.0 %   Monocytes Relative 12.4 (H) 3.0 - 12.0 %   Eosinophils Relative 9.5 (H) 0.0 - 5.0 %   Basophils Relative 0.8 0.0 - 3.0 %    Neutro Abs 2.5 1.4 - 7.7 K/uL   Lymphs Abs 2.1 0.7 - 4.0 K/uL   Monocytes Absolute 0.7 0.1 - 1.0 K/uL   Eosinophils Absolute 0.6 0.0 - 0.7 K/uL   Basophils Absolute 0.0 0.0 - 0.1 K/uL  Parathyroid  hormone, intact (no Ca)   Collection Time: 07/29/24  7:31 AM  Result Value Ref Range   PTH 18 16 - 77 pg/mL  Microalbumin / creatinine urine ratio   Collection Time: 07/29/24  7:31 AM  Result Value Ref Range   Microalb, Ur 2.8 (H) 0.0 - 1.9 mg/dL   Creatinine,U 31.7 mg/dL   Microalb Creat Ratio 40.4 (H) 0.0 - 30.0 mg/g  VITAMIN D  25 Hydroxy (Vit-D Deficiency, Fractures)   Collection Time: 07/29/24  7:31 AM  Result Value Ref Range   VITD 37.81 30.00 - 100.00 ng/mL  Phosphorus   Collection Time: 07/29/24  7:31 AM  Result Value Ref Range   Phosphorus 2.8 2.3 - 4.6 mg/dL  Comprehensive metabolic panel with GFR   Collection Time: 07/29/24  7:31 AM  Result Value Ref Range   Sodium 141 135 - 145 mEq/L   Potassium 4.9 3.5 - 5.1 mEq/L   Chloride 104 96 - 112 mEq/L   CO2 29 19 - 32 mEq/L   Glucose, Bld 88 70 - 99 mg/dL   BUN 28 (H) 6 - 23 mg/dL   Creatinine, Ser 8.75 0.40 - 1.50 mg/dL   Total Bilirubin 0.5 0.2 - 1.2 mg/dL   Alkaline Phosphatase 72 39 - 117 U/L   AST 27 0 - 37 U/L   ALT 27 0 - 53 U/L   Total Protein 6.8 6.0 - 8.3 g/dL   Albumin 4.3 3.5 - 5.2 g/dL   GFR 42.65 (  L) >60.00 mL/min   Calcium 9.3 8.4 - 10.5 mg/dL  Lipid panel   Collection Time: 07/29/24  7:31 AM  Result Value Ref Range   Cholesterol 208 (H) 0 - 200 mg/dL   Triglycerides 868.9 0.0 - 149.0 mg/dL   HDL 48.39 >60.99 mg/dL   VLDL 73.7 0.0 - 59.9 mg/dL   LDL Cholesterol 869 (H) 0 - 99 mg/dL   Total CHOL/HDL Ratio 4    NonHDL 156.17     Assessment & Plan:   Problem List Items Addressed This Visit     Health maintenance examination - Primary (Chronic)   Preventative protocols reviewed and updated unless pt declined. Discussed healthy diet and lifestyle.       Advanced directives, counseling/discussion  (Chronic)   Previously discussed.       HLD (hyperlipidemia)   Chronic, stable off medication.  Lp(a) elevated to 202 however cardiac CT with calcium score of zero - will remain off statin at this time.  The 10-year ASCVD risk score (Arnett DK, et al., 2019) is: 25.6%   Values used to calculate the score:     Age: 43 years     Clincally relevant sex: Male     Is Non-Hispanic African American: No     Diabetic: No     Tobacco smoker: No     Systolic Blood Pressure: 124 mmHg     Is BP treated: Yes     HDL Cholesterol: 51.6 mg/dL     Total Cholesterol: 208 mg/dL       Relevant Medications   amLODipine  (NORVASC ) 10 MG tablet   Elevated prostate specific antigen (PSA)   Continued trend in increasing PSA - rec f/u with new urologist at [facility] urology, # provided to call and schedule appointment.       Chronic kidney disease (CKD), stage III (moderate) (HCC)   Chronic, overall stable period. Reviewed importance of good hydration status.  Mild microalbuminuria - consider ACEI/ARB in place of amlodipine .       Essential hypertension   Chronic, stable. Continue current regimen.       Relevant Medications   amLODipine  (NORVASC ) 10 MG tablet   Vitamin D  deficiency   Continue 2000 units daily OTC vit D3 replacement      Eosinophilia   Reviewing trend over the years, eosinophils seem to increase in the fall and decrease over spring/summer months He notes some seasonal allergies, worse in spring/summer.       Right carotid bruit   Not appreciated today.         Meds ordered this encounter  Medications   amLODipine  (NORVASC ) 10 MG tablet    Sig: Take 1 tablet (10 mg total) by mouth daily.    Dispense:  90 tablet    Refill:  3    No orders of the defined types were placed in this encounter.   Patient Instructions  I do recommend returning to see [facility] urologist [phone] [facility] or [place]) To monitor elevated PSA.  Good to see you today Return as  needed or in 1 year for next physical/wellness visit   Follow up plan: Return in about 1 year (around 08/04/2025) for annual exam, prior fasting for blood work, medicare wellness visit.  Anton Blas, MD

## 2024-08-04 NOTE — Assessment & Plan Note (Signed)
 Chronic, stable. Continue current regimen.

## 2024-08-04 NOTE — Assessment & Plan Note (Signed)
 Not appreciated today.

## 2024-08-04 NOTE — Assessment & Plan Note (Signed)
 Previously discussed.

## 2024-08-04 NOTE — Assessment & Plan Note (Signed)
 Continued trend in increasing PSA - rec f/u with new urologist at [facility] urology, # provided to call and schedule appointment.

## 2024-08-04 NOTE — Assessment & Plan Note (Signed)
 Chronic, stable off medication.  Lp(a) elevated to 202 however cardiac CT with calcium score of zero - will remain off statin at this time.  The 10-year ASCVD risk score (Arnett DK, et al., 2019) is: 25.6%   Values used to calculate the score:     Age: 73 years     Clincally relevant sex: Male     Is Non-Hispanic African American: No     Diabetic: No     Tobacco smoker: No     Systolic Blood Pressure: 124 mmHg     Is BP treated: Yes     HDL Cholesterol: 51.6 mg/dL     Total Cholesterol: 208 mg/dL

## 2024-08-04 NOTE — Assessment & Plan Note (Signed)
 Continue 2000 units daily OTC vit D3 replacement

## 2024-08-04 NOTE — Assessment & Plan Note (Signed)
 Chronic, overall stable period. Reviewed importance of good hydration status.  Mild microalbuminuria - consider ACEI/ARB in place of amlodipine .

## 2024-09-04 ENCOUNTER — Other Ambulatory Visit: Payer: Self-pay | Admitting: Family Medicine

## 2024-09-04 DIAGNOSIS — I10 Essential (primary) hypertension: Secondary | ICD-10-CM

## 2025-02-01 ENCOUNTER — Ambulatory Visit: Admitting: Urology

## 2025-07-29 ENCOUNTER — Other Ambulatory Visit

## 2025-08-05 ENCOUNTER — Ambulatory Visit

## 2025-08-05 ENCOUNTER — Encounter: Admitting: Family Medicine
